# Patient Record
Sex: Male | Born: 1955
Health system: Southern US, Community
[De-identification: ages and names within clinical notes are randomized; demographics above are authoritative.]

## PROBLEM LIST (undated history)

## (undated) DIAGNOSIS — I10 Essential (primary) hypertension: Secondary | ICD-10-CM

## (undated) DIAGNOSIS — I4892 Unspecified atrial flutter: Secondary | ICD-10-CM

## (undated) DIAGNOSIS — E785 Hyperlipidemia, unspecified: Secondary | ICD-10-CM

## (undated) DIAGNOSIS — M109 Gout, unspecified: Secondary | ICD-10-CM

## (undated) DIAGNOSIS — M1990 Unspecified osteoarthritis, unspecified site: Secondary | ICD-10-CM

## (undated) DIAGNOSIS — I4819 Other persistent atrial fibrillation: Secondary | ICD-10-CM

## (undated) DIAGNOSIS — I499 Cardiac arrhythmia, unspecified: Secondary | ICD-10-CM

## (undated) DIAGNOSIS — R7989 Other specified abnormal findings of blood chemistry: Secondary | ICD-10-CM

## (undated) DIAGNOSIS — G2581 Restless legs syndrome: Secondary | ICD-10-CM

## (undated) DIAGNOSIS — I4891 Unspecified atrial fibrillation: Secondary | ICD-10-CM

## (undated) DIAGNOSIS — G4730 Sleep apnea, unspecified: Secondary | ICD-10-CM

## (undated) HISTORY — DX: Other persistent atrial fibrillation: I48.19

## (undated) HISTORY — DX: Restless legs syndrome: G25.81

## (undated) HISTORY — PX: ELBOW SURGERY: SHX618

## (undated) HISTORY — PX: HERNIA REPAIR: SHX51

## (undated) HISTORY — PX: FOOT SURGERY: SHX648

## (undated) HISTORY — DX: Hyperlipidemia, unspecified: E78.5

## (undated) HISTORY — PX: OTHER SURGICAL HISTORY: SHX169

## (undated) HISTORY — DX: Other specified abnormal findings of blood chemistry: R79.89

## (undated) HISTORY — DX: Sleep apnea, unspecified: G47.30

## (undated) HISTORY — DX: Essential (primary) hypertension: I10

## (undated) HISTORY — PX: KNEE ARTHROSCOPY W/ DEBRIDEMENT: SHX1867

## (undated) HISTORY — PX: COLONOSCOPY: SHX174

## (undated) HISTORY — DX: Unspecified osteoarthritis, unspecified site: M19.90

---

## 2010-12-29 HISTORY — PX: LUMBAR DISC SURGERY: SHX700

## 2013-12-29 HISTORY — PX: ATRIAL FIBRILLATION ABLATION: EP1191

## 2015-04-16 LAB — LIPID PANEL
Cholesterol: 221 mg/dL — AB (ref 0–200)
HDL: 51 mg/dL (ref 35–70)
LDL CALC: 141 mg/dL
Triglycerides: 144 mg/dL (ref 40–160)

## 2015-04-16 LAB — CBC AND DIFFERENTIAL
HEMATOCRIT: 46 % (ref 41–53)
HEMOGLOBIN: 15.5 g/dL (ref 13.5–17.5)
NEUTROS ABS: 65 /uL
Platelets: 259 10*3/uL (ref 150–399)
WBC: 5.5 10^3/mL

## 2015-04-16 LAB — PSA: PSA: 1

## 2015-09-10 DIAGNOSIS — I4892 Unspecified atrial flutter: Secondary | ICD-10-CM | POA: Insufficient documentation

## 2015-09-10 DIAGNOSIS — I484 Atypical atrial flutter: Secondary | ICD-10-CM | POA: Insufficient documentation

## 2015-09-10 DIAGNOSIS — Y9389 Activity, other specified: Secondary | ICD-10-CM | POA: Insufficient documentation

## 2016-04-29 DIAGNOSIS — Z8679 Personal history of other diseases of the circulatory system: Secondary | ICD-10-CM | POA: Insufficient documentation

## 2017-04-15 ENCOUNTER — Ambulatory Visit (INDEPENDENT_AMBULATORY_CARE_PROVIDER_SITE_OTHER): Payer: BLUE CROSS/BLUE SHIELD | Admitting: Family Medicine

## 2017-04-15 ENCOUNTER — Encounter: Payer: Self-pay | Admitting: Family Medicine

## 2017-04-15 VITALS — BP 130/83 | HR 70 | Temp 98.2°F | Resp 20 | Ht 72.25 in | Wt 222.5 lb

## 2017-04-15 DIAGNOSIS — F419 Anxiety disorder, unspecified: Secondary | ICD-10-CM | POA: Diagnosis not present

## 2017-04-15 DIAGNOSIS — M79644 Pain in right finger(s): Secondary | ICD-10-CM

## 2017-04-15 DIAGNOSIS — I482 Chronic atrial fibrillation: Secondary | ICD-10-CM

## 2017-04-15 DIAGNOSIS — G8929 Other chronic pain: Secondary | ICD-10-CM | POA: Insufficient documentation

## 2017-04-15 DIAGNOSIS — Z0000 Encounter for general adult medical examination without abnormal findings: Secondary | ICD-10-CM | POA: Diagnosis not present

## 2017-04-15 DIAGNOSIS — Z79899 Other long term (current) drug therapy: Secondary | ICD-10-CM | POA: Insufficient documentation

## 2017-04-15 DIAGNOSIS — E785 Hyperlipidemia, unspecified: Secondary | ICD-10-CM | POA: Insufficient documentation

## 2017-04-15 DIAGNOSIS — I4821 Permanent atrial fibrillation: Secondary | ICD-10-CM | POA: Insufficient documentation

## 2017-04-15 LAB — BASIC METABOLIC PANEL WITH GFR
BUN: 15 mg/dL (ref 7–25)
CHLORIDE: 102 mmol/L (ref 98–110)
CO2: 25 mmol/L (ref 20–31)
Calcium: 9.8 mg/dL (ref 8.6–10.3)
Creat: 0.92 mg/dL (ref 0.70–1.25)
GFR, EST NON AFRICAN AMERICAN: 89 mL/min (ref >=60)
GLUCOSE: 95 mg/dL (ref 65–99)
Potassium: 4.7 mmol/L (ref 3.5–5.3)
Sodium: 138 mmol/L (ref 135–146)

## 2017-04-15 MED ORDER — ESCITALOPRAM OXALATE 10 MG PO TABS: 10.0000 mg | ORAL_TABLET | Freq: Every day | ORAL | 0 refills | Status: DC
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
## 2017-04-15 NOTE — Patient Instructions (Signed)
Nice to meet you today! We will refer to cardiology for A. Fib and sports med for your thumb.   Once we get your records from prior doctors/primary we would want you to schedule your physical. You could go ahead and schedule a physical to occur within 2 months, we should have whatever records we are going to get by that time.    I refilled the lexapro, you can try 1/2 tab for 1 week, then 1/2 tab every  Other day for 1 week, then stop.   Please help [place] help you:  We are honored you have chosen [facility] for your Primary Care home. Below you will find basic instructions that you may need to access in the future. Please help [place] help you by reading the instructions, which cover many of the frequent questions we experience.   Prescription refills and request:  -In order to allow more efficient response time, please call your pharmacy for all refills. They will forward the request electronically to [place]. This allows for the quickest possible response. Request left on a nurse line can take longer to refill, since these are checked as time allows between office patients and other phone calls.  - refill request can take up to 3-5 working days to complete.  - If request is sent electronically and request is appropiate, it is usually completed in 1-2 business days.  - all patients will need to be seen routinely for all chronic medical conditions requiring prescription medications (see follow-up below). If you are overdue for follow up on your condition, you will be asked to make an appointment and we will call in enough medication to cover you until your appointment (up to 30 days).  - all controlled substances will require a face to face visit to request/refill.  - if you desire your prescriptions to go through a new pharmacy, and have an active script at original pharmacy, you will need to call your pharmacy and have scripts transferred to new pharmacy. This is completed between the pharmacy locations  and not by your provider.    Results: If any images or labs were ordered, it can take up to 1 week to get results depending on the test ordered and the lab/facility running and resulting the test. - Normal or stable results, which do not need further discussion, will be released to your mychart immediately with attached note to you. A call will not be generated for normal results. Please make certain to sign up for mychart. If you have questions on how to activate your mychart you can call the front office.  - If your results need further discussion, our office will attempt to contact you via phone, and if unable to reach you after 2 attempts, we will release your abnormal result to your mychart with instructions.  - All results will be automatically released in mychart after 1 week.  - Your provider will provide you with explanation and instruction on all relevant material in your results. Please keep in mind, results and labs may appear confusing or abnormal to the untrained eye, but it does not mean they are actually abnormal for you personally. If you have any questions about your results that are not covered, or you desire more detailed explanation than what was provided, you should make an appointment with your provider to do so.   Our office handles many outgoing and incoming calls daily. If we have not contacted you within 1 week about your results, please  check your mychart to see if there is a message first and if not, then contact our office.  In helping with this matter, you help decrease call volume, and therefore allow [place] to be able to respond to patients needs more efficiently.   Acute office visits (sick visit):  An acute visit is intended for a new problem and are scheduled in shorter time slots to allow schedule openings for patients with new problems. This is the appropriate visit to discuss a new problem. In order to provide you with excellent quality medical care with proper time for  you to explain your problem, have an exam and receive treatment with instructions, these appointments should be limited to one new problem per visit. If you experience a new problem, in which you desire to be addressed, please make an acute office visit, we save openings on the schedule to accommodate you. Please do not save your new problem for any other type of visit, let us take care of it properly and quickly for you.   Follow up visits:  Depending on your condition(s) your provider will need to see you routinely in order to provide you with quality care and prescribe medication(s). Most chronic conditions (Example: hypertension, Diabetes, depression/anxiety... etc), require visits a couple times a year. Your provider will instruct you on proper follow up for your personal medical conditions and history. Please make certain to make follow up appointments for your condition as instructed. Failing to do so could result in lapse in your medication treatment/refills. If you request a refill, and are overdue to be seen on a condition, we will always provide you with a 30 day script (once) to allow you time to schedule.    Medicare wellness (well visit): - we have a wonderful Nurse Maudie Mercury), that will meet with you and provide you will yearly medicare wellness visits. These visits should occur yearly (can not be scheduled less than 1 calendar year apart) and cover preventive health, immunizations, advance directives and screenings you are entitled to yearly through your medicare benefits. Do not miss out on your entitled benefits, this is when medicare will pay for these benefits to be ordered for you.  These are strongly encouraged by your provider and is the appropriate type of visit to make certain you are up to date with all preventive health benefits. If you have not had your medicare wellness exam in the last 12 months, please make certain to schedule one by calling the office and schedule your medicare  wellness with Maudie Mercury as soon as possible.   Yearly physical (well visit):  - Adults are recommended to be seen yearly for physicals. Check with your insurance and date of your last physical, most insurances require one calendar year between physicals. Physicals include all preventive health topics, screenings, medical exam and labs that are appropriate for gender/age and history. You may have fasting labs needed at this visit. This is a well visit (not a sick visit), acute topics should not be covered during this visit.  - Pediatric patients are seen more frequently when they are younger. Your provider will advise you on well child visit timing that is appropriate for your their age. - This is not a medicare wellness visit. Medicare wellness exams do not have an exam portion to the visit. Some medicare companies allow for a physical, some do not allow a yearly physical. If your medicare allows a yearly physical you can schedule the medicare wellness with our nurse Maudie Mercury and  have your physical with your provider after, on the same day. Please check with insurance for your full benefits.   Late Policy/No Shows:  - all new patients should arrive 15-30 minutes earlier than appointment to allow [place] time  to  obtain all personal demographics,  insurance information and for you to complete office paperwork. - All established patients should arrive 10-15 minutes earlier than appointment time to update all information and be checked in .  - In our best efforts to run on time, if you are late for your appointment you will be asked to either reschedule or if able, we will work you back into the schedule. There will be a wait time to work you back in the schedule,  depending on availability.  - If you are unable to make it to your appointment as scheduled, please call 24 hours ahead of time to allow [place] to fill the time slot with someone else who needs to be seen. If you do not cancel your appointment ahead of time, you may  be charged a no show fee.

## 2017-04-16 ENCOUNTER — Telehealth: Payer: Self-pay | Admitting: Family Medicine

## 2017-04-16 NOTE — Telephone Encounter (Signed)
Spoke to patient reviewed lab results and instructions. Patient will follow up with getting his medical records to [place]. He will call back to schedule a CPE.

## 2017-04-16 NOTE — Telephone Encounter (Signed)
Please call pt: - his labs from yesterday are normal. He can sign up for mychart (he is a new pt) to review the specifics if he desires.  - I have placed the referral to cardiology and Sports med as we discussed.  - recommend he schedule a physical sometime in May, that should allow [place] time to receive any records that we will be able to get from prior providers.  - will definitely need his GI recs,

## 2017-04-17 ENCOUNTER — Encounter: Payer: Self-pay | Admitting: Family Medicine

## 2017-04-17 ENCOUNTER — Encounter: Payer: BLUE CROSS/BLUE SHIELD | Admitting: Sports Medicine

## 2017-04-17 ENCOUNTER — Encounter: Payer: Self-pay | Admitting: *Deleted

## 2017-04-22 ENCOUNTER — Ambulatory Visit (INDEPENDENT_AMBULATORY_CARE_PROVIDER_SITE_OTHER): Payer: BLUE CROSS/BLUE SHIELD | Admitting: Cardiovascular Disease

## 2017-04-22 ENCOUNTER — Encounter: Payer: Self-pay | Admitting: Cardiovascular Disease

## 2017-04-22 VITALS — BP 141/81 | HR 60 | Ht 72.0 in | Wt 227.0 lb

## 2017-04-22 DIAGNOSIS — I482 Chronic atrial fibrillation: Secondary | ICD-10-CM

## 2017-04-22 DIAGNOSIS — E7800 Pure hypercholesterolemia, unspecified: Secondary | ICD-10-CM

## 2017-04-22 DIAGNOSIS — I4821 Permanent atrial fibrillation: Secondary | ICD-10-CM

## 2017-04-22 NOTE — Assessment & Plan Note (Signed)
Mr. Lofstrom had a fib ablation by Dr. Tally Due [facility] 5-7 years ago. He had 4-5 DC cardioversion since the last one was a year ago at [facility]. He is on Tikosyn and currently is not on oral anticoagulation.

## 2017-04-22 NOTE — Assessment & Plan Note (Signed)
History of hyperlipidemia on statin therapy followed by his PCP 

## 2017-04-22 NOTE — Patient Instructions (Signed)
Medication Instructions: Your physician recommends that you continue on your current medications as directed. Please refer to the Current Medication list given to you today.   Follow-Up: Your physician wants you to follow-up in: 1 year with Dr. Gwenlyn Found. You will receive a reminder letter in the mail two months in advance. If you don't receive a letter, please call our office to schedule the follow-up appointment.

## 2017-04-22 NOTE — Progress Notes (Signed)
04/22/2017 Johnathan [name]   1956/10/09  [id number]  Primary Physician Howard Pouch, DO Primary Cardiologist: Lorretta Harp MD Renae Gloss  HPI:  Mr. Johnathan [name] is a delightful 61 year old mildly overweight married Caucasian male father of 2 children, grandfather of 1 grandchild accompanied by his wife Johnathan [name]. 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 He was referred by Dr. Kerry Hough D.O. establish her cardiovascular practice because his prior A. fib history. His cardiovascular risk factor profile is notable for treated hyperlipidemia and family history. Brother died of myocardial infarction at age 105. He's never had a heart attack or stroke. He denies chest pain or shortness of breath. He does have obstructive sleep apnea on CPAP which she wears intermittently. He had A. fib ablation at [facility] 5-7 years ago by Dr. Tally Due. He currently is on Tikosyn. He is not on oral anticoagulation. He said 4-5 DC cardioversions in the past since his ablation, the last one being one year ago at [facility] by Dr. Kelli Churn. .   Current Outpatient Prescriptions  Medication Sig Dispense Refill  . atorvastatin (LIPITOR) 40 MG tablet Take 40 mg by mouth daily.     [name] dofetilide (TIKOSYN) 500 MCG capsule Take 500 mcg by mouth 2 (two) times daily.     [name] escitalopram (LEXAPRO) 10 MG tablet Take 1 tablet (10 mg total) by mouth daily. 30 tablet 0   No current facility-administered medications for this visit.     Allergies  Allergen Reactions  . Sulfa Antibiotics     Unknown reaction in childhood    Social History   Social History  . Marital status: Married    Spouse name: [name]  . Number of children: 2  . Years of education: 16   Occupational History  . Not on file.   Social History Main Topics  . Smoking status: Never Smoker  . Smokeless tobacco: Former Systems developer    Types: Chew  . Alcohol use 6.0 oz/week    10 Cans of beer  per week  . Drug use: No  . Sexual activity: Yes    Partners: Female     Comment: Married   Other Topics Concern  . Not on file   Social History Narrative   Married to [name]. 2 children [name].    BS degree.    Wear seatbelt. Wears a bicycle helmet. Smoke detector in the home.   Firearms in the home.   Feels safe in  relationships.     Review of Systems: General: negative for chills, fever, night sweats or weight changes.  Cardiovascular: negative for chest pain, dyspnea on exertion, edema, orthopnea, palpitations, paroxysmal nocturnal dyspnea or shortness of breath Dermatological: negative for rash Respiratory: negative for cough or wheezing Urologic: negative for hematuria Abdominal: negative for nausea, vomiting, diarrhea, bright red blood per rectum, melena, or hematemesis Neurologic: negative for visual changes, syncope, or dizziness All other systems reviewed and are otherwise negative except as noted above.    Blood pressure (!) 141/81, pulse 60, height 6' (1.829 m), weight 227 lb (103 kg).  General appearance: alert and no distress Neck: no adenopathy, no carotid bruit, no JVD, supple, symmetrical, trachea midline and thyroid not enlarged, symmetric, no tenderness/mass/nodules Lungs: clear to auscultation bilaterally Heart: regular rate and rhythm, S1, S2 normal, no murmur, click, rub or gallop Extremities: extremities normal, atraumatic, no cyanosis or edema  EKG sinus rhythm at 60  with ST or T wave changes. The QTC was 416 ms. I personally reviewed this EKG  ASSESSMENT AND PLAN:   Permanent atrial fibrillation [facility]) Mr. Rivkin had a fib ablation by Dr. Tally Due [facility] 5-7 years ago. He had 4-5 DC cardioversion since the last one was a year ago at [facility]. He is on Tikosyn and currently is not on oral anticoagulation.  Hyperlipidemia History of hyperlipidemia on statin therapy followed by his PCP      Lorretta Harp MD [facility],  [facility] 04/22/2017 4:39 PM

## 2017-04-30 ENCOUNTER — Encounter: Payer: BLUE CROSS/BLUE SHIELD | Admitting: Sports Medicine

## 2017-05-07 ENCOUNTER — Ambulatory Visit (INDEPENDENT_AMBULATORY_CARE_PROVIDER_SITE_OTHER): Payer: BLUE CROSS/BLUE SHIELD

## 2017-05-07 ENCOUNTER — Encounter: Payer: Self-pay | Admitting: Sports Medicine

## 2017-05-07 ENCOUNTER — Ambulatory Visit (INDEPENDENT_AMBULATORY_CARE_PROVIDER_SITE_OTHER): Payer: BLUE CROSS/BLUE SHIELD | Admitting: Sports Medicine

## 2017-05-07 DIAGNOSIS — M868X4 Other osteomyelitis, hand: Secondary | ICD-10-CM

## 2017-05-07 DIAGNOSIS — M1811 Unilateral primary osteoarthritis of first carpometacarpal joint, right hand: Secondary | ICD-10-CM

## 2017-05-07 DIAGNOSIS — M19041 Primary osteoarthritis, right hand: Secondary | ICD-10-CM | POA: Diagnosis not present

## 2017-05-07 NOTE — Assessment & Plan Note (Addendum)
Has failed conservative measures and topical anti-inflammatories and oral NSAIDs.  He had a steroid injection over a year ago that provided good response, this will be repeated today. X-rays.  Return to see me in one month.

## 2017-05-07 NOTE — Progress Notes (Signed)
   Subjective:    I'm seeing this patient as a consultation for:   Dr. Howard Pouch  CC: Right hand pain  HPI: This is a pleasant 61 year old male, for years he's had pain that he localizes at the base of the right thumb, moderate, persistent without radiation, had an injection over a year ago that seemed to work fairly well. Since then NSAIDs, topical anti-inflammatories, activity modification has not been effective. He desires interventional treatment today.  Past medical history:  Negative.  See flowsheet/record as well for more information.  Surgical history: Negative.  See flowsheet/record as well for more information.  Family history: Negative.  See flowsheet/record as well for more information.  Social history: Negative.  See flowsheet/record as well for more information.  Allergies, and medications have been entered into the medical record, reviewed, and no changes needed.   Review of Systems: No headache, visual changes, nausea, vomiting, diarrhea, constipation, dizziness, abdominal pain, skin rash, fevers, chills, night sweats, weight loss, swollen lymph nodes, body aches, joint swelling, muscle aches, chest pain, shortness of breath, mood changes, visual or auditory hallucinations.   Objective:   General: Well Developed, well nourished, and in no acute distress.  Neuro/Psych: Alert and oriented x3, extra-ocular muscles intact, able to move all 4 extremities, sensation grossly intact. Skin: Warm and dry, no rashes noted.  Respiratory: Not using accessory muscles, speaking in full sentences, trachea midline.  Cardiovascular: Pulses palpable, no extremity edema. Abdomen: Does not appear distended. Right Wrist: Inspection normal with no visible erythema or swelling. ROM smooth and normal with good flexion and extension and ulnar/radial deviation that is symmetrical with opposite wrist. Palpation is normal over metacarpals, navicular, lunate, and TFCC; tendons without tenderness/  swelling No snuffbox tenderness. No tenderness over Canal of [place]. Strength 5/5 in all directions without pain. Negative tinel's and phalens signs. Negative Finkelstein sign. Negative Watson's test. Tender to palpation at the thumb basal joint  Procedure: Real-time Ultrasound Guided Injection of right trapeziometacarpal joint Device: GE Logiq E  Verbal informed consent obtained.  Time-out conducted.  Noted no overlying erythema, induration, or other signs of local infection.  Skin prepped in a sterile fashion.  Local anesthesia: Topical Ethyl chloride.  With sterile technique and under real time ultrasound guidance:  I guided the 25-gauge needle into the joint, and injected 1/2 mL kenalog 40, 1/2 mL lidocaine. Completed without difficulty  Pain immediately resolved suggesting accurate placement of the medication.  Advised to call if fevers/chills, erythema, induration, drainage, or persistent bleeding.  Images permanently stored and available for review in the ultrasound unit.  Impression: Technically successful ultrasound guided injection.  Impression and Recommendations:   This case required medical decision making of moderate complexity.  Primary osteoarthritis of first carpometacarpal joint of right hand Has failed conservative measures and topical anti-inflammatories and oral NSAIDs.  He had a steroid injection over a year ago that provided good response, this will be repeated today. X-rays.  Return to see me in one month.

## 2017-05-26 ENCOUNTER — Other Ambulatory Visit: Payer: Self-pay

## 2017-05-26 MED ORDER — ATORVASTATIN CALCIUM 40 MG PO TABS: 40.0000 mg | ORAL_TABLET | Freq: Every day | ORAL | 1 refills | Status: DC

## 2017-05-26 NOTE — Telephone Encounter (Signed)
Refill sent on Atorvastatin.

## 2017-06-05 ENCOUNTER — Ambulatory Visit: Payer: BLUE CROSS/BLUE SHIELD | Admitting: Sports Medicine

## 2017-06-05 DIAGNOSIS — Z0189 Encounter for other specified special examinations: Secondary | ICD-10-CM

## 2017-07-28 ENCOUNTER — Other Ambulatory Visit: Payer: Self-pay

## 2017-07-28 MED ORDER — ATORVASTATIN CALCIUM 40 MG PO TABS: 40.0000 mg | ORAL_TABLET | Freq: Every day | ORAL | 0 refills | Status: DC

## 2017-07-28 NOTE — Telephone Encounter (Signed)
Refill sent.

## 2017-08-04 ENCOUNTER — Telehealth: Payer: Self-pay | Admitting: Family Medicine

## 2017-08-04 NOTE — Telephone Encounter (Signed)
Patient requesting refill on Lexapro. Appointment scheduled for Friday, Aug. 10. Last filled 04/15/17, #30 NRF.

## 2017-08-04 NOTE — Telephone Encounter (Signed)
Patient notified and verbalized understanding. 

## 2017-08-04 NOTE — Telephone Encounter (Signed)
Patient is checking on Rx. Please call him back.

## 2017-08-04 NOTE — Telephone Encounter (Signed)
I received this request today for refill on lexapro 10 mg, that was written in April for a month only script. I am uncertain of the need to refill urgently if he has not been taking for months and has an appt for this issue in 3 days. He was tapering off this medication at that time.  As long as not an urgent need, will wait until his appt in 3 days to fill. If urgent he needs  to be seen sooner.

## 2017-08-07 ENCOUNTER — Encounter: Payer: Self-pay | Admitting: Family Medicine

## 2017-08-07 ENCOUNTER — Ambulatory Visit (INDEPENDENT_AMBULATORY_CARE_PROVIDER_SITE_OTHER): Payer: BLUE CROSS/BLUE SHIELD | Admitting: Family Medicine

## 2017-08-07 DIAGNOSIS — F419 Anxiety disorder, unspecified: Secondary | ICD-10-CM | POA: Diagnosis not present

## 2017-08-07 MED ORDER — ESCITALOPRAM OXALATE 5 MG PO TABS: 5.0000 mg | ORAL_TABLET | Freq: Every day | ORAL | 0 refills | Status: DC

## 2017-08-07 NOTE — Progress Notes (Signed)
Patient ID: Johnathan [name], male  DOB: May 02, 1956, 61 y.o.   MRN: [id number] Patient Care Team    Relationship Specialty Notifications Start End  Ma Hillock, DO PCP - General Family Medicine  04/15/17    Chief Complaint  Patient presents with  . Anxiety     Subjective:  Johnathan [name] is a 61 y.o.  male present for anxiety Anxiety:  Pt presents for follow up on his anxiety. When he established in April he desired to stop medication  (see note below). He then requested refill last week on medicine. He reports he has really been taking 1/2 tab daily. He does not feel it is doing much for him, but he also feels he gets more headaches when he doe snot use it. He still desires to come off medication all together.  Prior note 03/2017:  Patient reports he was started on Lexapro 10 mg many years ago when he was living in [place]. He states he was overwhelmed, working a lot of hours and was having difficulty adjusting. He reports he has never been successful stopping medication. He states he never has tapered off of the medication, but he has stopped the medication abruptly on a few occasions and needed to restart secondary to having side effects of dizziness.   Depression screen [facility] 2/9 08/07/2017 04/15/2017  Decreased Interest 0 0  Down, Depressed, Hopeless 0 0  PHQ - 2 Score 0 0  Altered sleeping 0 -  Tired, decreased energy 0 -  Change in appetite 0 -  Feeling bad or failure about yourself  0 -  Trouble concentrating 0 -  Moving slowly or fidgety/restless 0 -  Suicidal thoughts 0 -  PHQ-9 Score 0 -     There is no immunization history on file for this patient.   Past Medical History:  Diagnosis Date  . A-fib ([place])   . Heart murmur   . Hyperlipidemia   . Hypertension   . RLS (restless legs syndrome)   . Sleep apnea    CPAP   Allergies  Allergen Reactions  . Sulfa Antibiotics     Unknown reaction in childhood   Past Surgical History:  Procedure Laterality Date  .  ATRIAL FIBRILLATION ABLATION  2015  . FOOT SURGERY    . HERNIA REPAIR    . LUMBAR [place] SURGERY  2012   L3-L4, No hardware   Family History  Problem Relation Age of Onset  . Diabetes Mother   . Diabetes Brother   . Early death Brother   . Heart disease Brother    Social History   Social History  . Marital status: Married    Spouse name: Johnathan [name]  . Number of children: 2  . Years of education: 16   Occupational History  . Not on file.   Social History Main Topics  . Smoking status: Never Smoker  . Smokeless tobacco: Former Systems developer    Types: Chew  . Alcohol use 6.0 oz/week    10 Cans of beer per week  . Drug use: No  . Sexual activity: Yes    Partners: Female     Comment: Married   Other Topics Concern  . Not on file   Social History Narrative   Married to [place]. 2 children [place].    BS degree.    Wear seatbelt. Wears a bicycle helmet. Smoke detector in the home.   Firearms in the home.   Feels safe in  relationships.  Allergies as of 08/07/2017      Reactions   Sulfa Antibiotics    Unknown reaction in childhood      Medication List       Accurate as of 08/07/17  2:50 PM. Always use your most recent med list.          atorvastatin 40 MG tablet Commonly known as:  LIPITOR Take 1 tablet (40 mg total) by mouth daily. Patient needs follow up/CPE for further refills.   dofetilide 500 MCG capsule Commonly known as:  TIKOSYN Take 500 mcg by mouth 2 (two) times daily.   escitalopram 5 MG tablet Commonly known as:  LEXAPRO Take 1 tablet (5 mg total) by mouth daily.        No results found for this or any previous visit (from the past 2160 hour(s)).  Patient was never admitted.   ROS: 14 pt review of systems performed and negative (unless mentioned in an HPI)  Objective: BP 134/80 (BP Location: Left Arm, Patient Position: Sitting, Cuff Size: Large)   Pulse 68   Temp 98.4 F (36.9 C)   Resp 20   Ht 6' (1.829 m)   Wt 232 lb 4 oz  (105.3 kg)   SpO2 97%   BMI 31.50 kg/m  Gen: Afebrile. No acute distress.  Psych: Normal affect, dress and demeanor. Normal speech. Normal thought content and judgment..    Assessment/plan: Johnathan [name] is a 61 y.o. male present for  Anxiety - Patient desires tapering off medication again.Since he has been take 5 mg (by halving the 10), will refill lexapro 5 mg for him to taper over next 3-4 weeks. Instructions on taper provided in AVS. - If he decides after trying to taper off, he was feeling better on the medicine, ok to prescribe lexapro 5 mg QD for 6 months then follow (as long as call back is within a few weeks after stop of med).  - F/u PRN   He was encouraged to schedule his CPE so we can get all his preventive screenings completed. He is unable to locate records on any prior colonoscopy.    Return in about 6 months (around 02/07/2018), or if symptoms worsen or fail to improve.  Electronically signed by: Howard Pouch, DO [place]

## 2017-08-07 NOTE — Patient Instructions (Signed)
1 pill for 1 week, then 1/2 pill for 2 weeks, then 1/2 pill every other day for 3 doses. Then stop.    I hope this works, if it does not or you decide you want to stay on med, we can always refill the 5 mg dose for you.

## 2017-09-01 ENCOUNTER — Other Ambulatory Visit: Payer: Self-pay

## 2017-09-01 MED ORDER — ATORVASTATIN CALCIUM 40 MG PO TABS
40.0000 mg | ORAL_TABLET | Freq: Every day | ORAL | 0 refills | Status: DC
Start: 2017-09-01 — End: 2018-03-22

## 2017-09-01 NOTE — Telephone Encounter (Signed)
Refill sent.

## 2017-09-04 ENCOUNTER — Other Ambulatory Visit: Payer: Self-pay | Admitting: *Deleted

## 2017-09-04 DIAGNOSIS — F419 Anxiety disorder, unspecified: Secondary | ICD-10-CM

## 2017-09-04 MED ORDER — ESCITALOPRAM OXALATE 5 MG PO TABS: 5.0000 mg | ORAL_TABLET | Freq: Every day | ORAL | 5 refills | Status: DC

## 2017-09-04 NOTE — Telephone Encounter (Signed)
Refill request for Lexapro 5 mg received. Spoke with patient he is continuing to take the 5 mg dose daily. Refills sent to pharmacy as directed

## 2017-10-02 ENCOUNTER — Telehealth: Payer: Self-pay | Admitting: Cardiovascular Disease

## 2017-10-02 MED ORDER — DOFETILIDE 500 MCG PO CAPS: 500.0000 ug | ORAL_CAPSULE | Freq: Two times a day (BID) | ORAL | 12 refills | Status: DC

## 2017-10-02 NOTE — Telephone Encounter (Signed)
°*  STAT* If patient is at the pharmacy, call can be transferred to refill team.   1. Which medications need to be refilled? (please list name of each medication and dose if known)  dosetilide 0.31mh 2x day 2. Which pharmacy/location (including street and city if local pharmacy) is medication to be sent to? [facility] [place]  3. Do they need a 30 day or 90 day supply? 63  Pt said he went to [facility] for for his refills  Pt want to be notified that he is going to get the mediation or not by today

## 2017-10-02 NOTE — Telephone Encounter (Signed)
Refill sent to the pharmacy electronically.  

## 2017-10-16 ENCOUNTER — Telehealth: Payer: Self-pay | Admitting: Cardiovascular Disease

## 2017-10-16 NOTE — Telephone Encounter (Signed)
Returned call to patient of Dr. Gwenlyn Found. He needs to get a tooth pulled. They plan to use the "gas" and novocain. He would like MD OK for this and to ensure there would be not issues w/this and his AFib. No "formal" cardiac clearance is needed  Procedure is tentative for Friday 10/26   Routed to MD to review

## 2017-10-16 NOTE — Telephone Encounter (Signed)
New message    Pt is calling asking for a call back. He has a question about afib.

## 2017-10-16 NOTE — Telephone Encounter (Signed)
No problems regarding his dental procedure and cardiac issues.

## 2017-10-22 NOTE — Telephone Encounter (Signed)
Informed pt of Dr. Kennon Holter recommendation. Pt verbalized thanks.

## 2017-11-15 ENCOUNTER — Emergency Department (HOSPITAL_COMMUNITY)
Admission: EM | Admit: 2017-11-15 | Discharge: 2017-11-15 | Disposition: A | Discharge status: Home / Self Care | Payer: BLUE CROSS/BLUE SHIELD | Attending: Emergency Medicine | Admitting: Emergency Medicine

## 2017-11-15 ENCOUNTER — Other Ambulatory Visit: Payer: Self-pay

## 2017-11-15 ENCOUNTER — Encounter (HOSPITAL_COMMUNITY): Payer: Self-pay

## 2017-11-15 DIAGNOSIS — I10 Essential (primary) hypertension: Secondary | ICD-10-CM | POA: Insufficient documentation

## 2017-11-15 DIAGNOSIS — Z79899 Other long term (current) drug therapy: Secondary | ICD-10-CM | POA: Insufficient documentation

## 2017-11-15 DIAGNOSIS — I4891 Unspecified atrial fibrillation: Secondary | ICD-10-CM | POA: Diagnosis not present

## 2017-11-15 DIAGNOSIS — R000 Tachycardia, unspecified: Secondary | ICD-10-CM | POA: Diagnosis not present

## 2017-11-15 DIAGNOSIS — R002 Palpitations: Secondary | ICD-10-CM | POA: Diagnosis not present

## 2017-11-15 LAB — BASIC METABOLIC PANEL
Anion gap: 9 (ref 5–15)
BUN: 16 mg/dL (ref 6–20)
CHLORIDE: 103 mmol/L (ref 101–111)
CO2: 24 mmol/L (ref 22–32)
CREATININE: 1 mg/dL (ref 0.61–1.24)
Calcium: 9.4 mg/dL (ref 8.9–10.3)
GFR calc Af Amer: >60 mL/min (ref >60)
GFR calc non Af Amer: >60 mL/min (ref >60)
Glucose, Bld: 117 mg/dL — ABNORMAL HIGH (ref 65–99)
POTASSIUM: 3.9 mmol/L (ref 3.5–5.1)
Sodium: 136 mmol/L (ref 135–145)

## 2017-11-15 LAB — CBC
HEMATOCRIT: 47.5 % (ref 39.0–52.0)
Hemoglobin: 16.8 g/dL (ref 13.0–17.0)
MCH: 31.5 pg (ref 26.0–34.0)
MCHC: 35.4 g/dL (ref 30.0–36.0)
MCV: 89.1 fL (ref 78.0–100.0)
PLATELETS: 254 10*3/uL (ref 150–400)
RBC: 5.33 MIL/uL (ref 4.22–5.81)
RDW: 13.1 % (ref 11.5–15.5)
WBC: 6.9 10*3/uL (ref 4.0–10.5)

## 2017-11-15 NOTE — ED Provider Notes (Signed)
[facility] EMERGENCY DEPARTMENT Provider Note   CSN: [id number] Arrival date & time: 11/15/17  1158     History   Chief Complaint Chief Complaint  Patient presents with  . Palpitations    afib    HPI Johnathan [name] is a 61 y.o. male.  Patient with hx afib, c/o palpitations onset this AM.  States occurred at rest. Denies syncope. No chest pain or discomfort. No sob. States recent health at baseline. Indicates compliant w meds, denies recent change in meds or doses. Some recent increased in stress, but states nothing severe/terrible. Did have several etoh beverages last night. Denies other recent episodes of afib.    The history is provided by the patient.  Palpitations   Pertinent negatives include no fever, no chest pain, no abdominal pain, no headaches, no back pain and no shortness of breath.    Past Medical History:  Diagnosis Date  . A-fib ([place])   . Heart murmur   . Hyperlipidemia   . Hypertension   . RLS (restless legs syndrome)   . Sleep apnea    CPAP    Patient Active Problem List   Diagnosis Date Noted  . Primary osteoarthritis of first carpometacarpal joint of right hand 05/07/2017  . Permanent atrial fibrillation ([place]) 04/15/2017  . Chronic pain of right thumb 04/15/2017  . Anxiety 04/15/2017  . Long-term use of high-risk medication 04/15/2017  . Hyperlipidemia     Past Surgical History:  Procedure Laterality Date  . ATRIAL FIBRILLATION ABLATION  2015  . FOOT SURGERY    . HERNIA REPAIR    . LUMBAR [place] SURGERY  2012   L3-L4, No hardware       Home Medications    Prior to Admission medications   Medication Sig Start Date End Date Taking? Authorizing Provider  atorvastatin (LIPITOR) 40 MG tablet Take 1 tablet (40 mg total) by mouth daily. Patient needs follow up/CPE for further refills. 09/01/17   Kuneff, Renee A, DO  dofetilide (TIKOSYN) 500 MCG capsule Take 1 capsule (500 mcg total) by mouth 2 (two) times daily. 10/02/17    Lorretta Harp, MD  escitalopram (LEXAPRO) 5 MG tablet Take 1 tablet (5 mg total) by mouth daily. 09/04/17   McGowen, Adrian Blackwater, MD    Family History Family History  Problem Relation Age of Onset  . Diabetes Mother   . Diabetes Brother   . Early death Brother   . Heart disease Brother     Social History Social History   Tobacco Use  . Smoking status: Never Smoker  . Smokeless tobacco: Former Systems developer    Types: Chew  Substance Use Topics  . Alcohol use: Yes    Alcohol/week: 6.0 oz    Types: 10 Cans of beer per week  . Drug use: No     Allergies   Sulfa antibiotics   Review of Systems Review of Systems  Constitutional: Negative for fever.  HENT: Negative for sore throat.   Eyes: Negative for redness.  Respiratory: Negative for shortness of breath.   Cardiovascular: Positive for palpitations. Negative for chest pain.  Gastrointestinal: Negative for abdominal pain.  Genitourinary: Negative for flank pain.  Musculoskeletal: Negative for back pain.  Skin: Negative for rash.  Neurological: Negative for headaches.  Hematological: Does not bruise/bleed easily.  Psychiatric/Behavioral: Negative for confusion.     Physical Exam Updated Vital Signs BP (!) 143/90   Pulse 68   Temp 98.2 F (36.8 C) (Oral)   Resp  14   Ht 1.854 m (6\' 1" )   Wt 108.9 kg (240 lb)   SpO2 98%   BMI 31.66 kg/m   Physical Exam  Constitutional: He appears well-developed and well-nourished. No distress.  HENT:  Mouth/Throat: Oropharynx is clear and moist.  Eyes: Conjunctivae are normal.  Neck: Neck supple. No tracheal deviation present. No thyromegaly present.  Cardiovascular: Normal rate, regular rhythm, normal heart sounds and intact distal pulses. Exam reveals no gallop and no friction rub.  No murmur heard. Pulmonary/Chest: Effort normal and breath sounds normal. No accessory muscle usage. No respiratory distress.  Abdominal: Soft. He exhibits no distension. There is no tenderness.    Musculoskeletal: He exhibits no edema.  Neurological: He is alert.  Skin: Skin is warm and dry. He is not diaphoretic.  Psychiatric: He has a normal mood and affect.  Nursing note and vitals reviewed.    ED Treatments / Results  Labs (all labs ordered are listed, but only abnormal results are displayed) Results for orders placed or performed during the hospital encounter of 28/78/67  Basic metabolic panel  Result Value Ref Range   Sodium 136 135 - 145 mmol/L   Potassium 3.9 3.5 - 5.1 mmol/L   Chloride 103 101 - 111 mmol/L   CO2 24 22 - 32 mmol/L   Glucose, Bld 117 (H) 65 - 99 mg/dL   BUN 16 6 - 20 mg/dL   Creatinine, Ser 1.00 0.61 - 1.24 mg/dL   Calcium 9.4 8.9 - 10.3 mg/dL   GFR calc non Af Amer >60 >60 mL/min   GFR calc Af Amer >60 >60 mL/min   Anion gap 9 5 - 15  CBC  Result Value Ref Range   WBC 6.9 4.0 - 10.5 K/uL   RBC 5.33 4.22 - 5.81 MIL/uL   Hemoglobin 16.8 13.0 - 17.0 g/dL   HCT 47.5 39.0 - 52.0 %   MCV 89.1 78.0 - 100.0 fL   MCH 31.5 26.0 - 34.0 pg   MCHC 35.4 30.0 - 36.0 g/dL   RDW 13.1 11.5 - 15.5 %   Platelets 254 150 - 400 K/uL   EKG  EKG Interpretation  Date/Time:  Sunday November 15 2017 12:12:08 EST Ventricular Rate:  144 PR Interval:    QRS Duration: 80 QT Interval:  282 QTC Calculation: 436 R Axis:   59 Text Interpretation:  Atrial flutter with variable A-V block Nonspecific ST abnormality No previous tracing Confirmed by Lajean Saver [phone]) on 11/15/2017 12:34:15 PM       Radiology No results found.  Procedures Procedures (including critical care time)  Medications Ordered in ED Medications - No data to display   Initial Impression / Assessment and Plan / ED Course  I have reviewed the triage vital signs and the nursing notes.  Pertinent labs & imaging results that were available during my care of the patient were reviewed by me and considered in my medical decision making (see chart for details).  Continuous pulse ox and  monitor. o2 [place]. Labs sent.  Ecg.  Patient spontaneously converted to NSR in ED, and remains in sinus rhythm.  Discussed with cardiology on call - he indicates no need to start anticoag therapy, to rec avoid etoh and have f/u Dr Gwenlyn Found in office.   Pt remains in nsr, and remains asymptomatic in ED.   Final Clinical Impressions(s) / ED Diagnoses   Final diagnoses:  None    ED Discharge Orders    None  Lajean Saver, MD 11/15/17 1430

## 2017-11-15 NOTE — ED Triage Notes (Signed)
Onset this morning pt felt he was in Afib.  Took a dose of Xarelto, Tikosyn this morning.  No chest pain, shortness of breath or any other symptoms.

## 2017-11-15 NOTE — Discharge Instructions (Signed)
It was our pleasure to provide your ER care today - we hope that you feel better.  We discussed your case with your cardiology group - they indicate for you to follow up with them in their afib clinic.  They also recommend minimizing alcohol consumption.   Call office tomorrow to arrange appointment.  Return to ER if worse, persistent fast heart beat, chest pain, trouble breathing, other concern.

## 2017-12-01 ENCOUNTER — Ambulatory Visit: Payer: BLUE CROSS/BLUE SHIELD | Admitting: Cardiovascular Disease

## 2018-03-14 ENCOUNTER — Other Ambulatory Visit: Payer: Self-pay | Admitting: Family Medicine

## 2018-03-14 DIAGNOSIS — F419 Anxiety disorder, unspecified: Secondary | ICD-10-CM

## 2018-03-15 ENCOUNTER — Encounter: Payer: BLUE CROSS/BLUE SHIELD | Admitting: Family Medicine

## 2018-03-15 NOTE — Telephone Encounter (Signed)
Dr. Kuneff pt.  

## 2018-03-22 ENCOUNTER — Encounter: Payer: Self-pay | Admitting: Internal Medicine

## 2018-03-22 ENCOUNTER — Ambulatory Visit (INDEPENDENT_AMBULATORY_CARE_PROVIDER_SITE_OTHER): Payer: BLUE CROSS/BLUE SHIELD | Admitting: Family Medicine

## 2018-03-22 ENCOUNTER — Encounter: Payer: Self-pay | Admitting: Family Medicine

## 2018-03-22 VITALS — BP 128/82 | HR 70 | Temp 97.7°F | Ht 72.5 in | Wt 227.0 lb

## 2018-03-22 DIAGNOSIS — Z23 Encounter for immunization: Secondary | ICD-10-CM | POA: Diagnosis not present

## 2018-03-22 DIAGNOSIS — Z9989 Dependence on other enabling machines and devices: Secondary | ICD-10-CM

## 2018-03-22 DIAGNOSIS — Z114 Encounter for screening for human immunodeficiency virus [HIV]: Secondary | ICD-10-CM

## 2018-03-22 DIAGNOSIS — I482 Chronic atrial fibrillation: Secondary | ICD-10-CM

## 2018-03-22 DIAGNOSIS — Z79899 Other long term (current) drug therapy: Secondary | ICD-10-CM | POA: Diagnosis not present

## 2018-03-22 DIAGNOSIS — I4821 Permanent atrial fibrillation: Secondary | ICD-10-CM

## 2018-03-22 DIAGNOSIS — Z1211 Encounter for screening for malignant neoplasm of colon: Secondary | ICD-10-CM | POA: Diagnosis not present

## 2018-03-22 DIAGNOSIS — F419 Anxiety disorder, unspecified: Secondary | ICD-10-CM

## 2018-03-22 DIAGNOSIS — Z131 Encounter for screening for diabetes mellitus: Secondary | ICD-10-CM | POA: Diagnosis not present

## 2018-03-22 DIAGNOSIS — E7800 Pure hypercholesterolemia, unspecified: Secondary | ICD-10-CM | POA: Diagnosis not present

## 2018-03-22 DIAGNOSIS — Z0000 Encounter for general adult medical examination without abnormal findings: Secondary | ICD-10-CM | POA: Diagnosis not present

## 2018-03-22 DIAGNOSIS — G4733 Obstructive sleep apnea (adult) (pediatric): Secondary | ICD-10-CM | POA: Insufficient documentation

## 2018-03-22 DIAGNOSIS — Z125 Encounter for screening for malignant neoplasm of prostate: Secondary | ICD-10-CM | POA: Diagnosis not present

## 2018-03-22 DIAGNOSIS — E669 Obesity, unspecified: Secondary | ICD-10-CM | POA: Insufficient documentation

## 2018-03-22 DIAGNOSIS — Z1159 Encounter for screening for other viral diseases: Secondary | ICD-10-CM | POA: Diagnosis not present

## 2018-03-22 LAB — CBC WITH DIFFERENTIAL/PLATELET
BASOS PCT: 0.7 % (ref 0.0–3.0)
Basophils Absolute: 0 10*3/uL (ref 0.0–0.1)
EOS ABS: 0.2 10*3/uL (ref 0.0–0.7)
Eosinophils Relative: 3.9 % (ref 0.0–5.0)
HEMATOCRIT: 45.7 % (ref 39.0–52.0)
Hemoglobin: 15.7 g/dL (ref 13.0–17.0)
LYMPHS ABS: 1.4 10*3/uL (ref 0.7–4.0)
Lymphocytes Relative: 24.3 % (ref 12.0–46.0)
MCHC: 34.3 g/dL (ref 30.0–36.0)
MCV: 90.4 fl (ref 78.0–100.0)
Monocytes Absolute: 0.6 10*3/uL (ref 0.1–1.0)
Monocytes Relative: 10.3 % (ref 3.0–12.0)
NEUTROS ABS: 3.5 10*3/uL (ref 1.4–7.7)
NEUTROS PCT: 60.8 % (ref 43.0–77.0)
PLATELETS: 264 10*3/uL (ref 150.0–400.0)
RBC: 5.05 Mil/uL (ref 4.22–5.81)
RDW: 14 % (ref 11.5–15.5)
WBC: 5.8 10*3/uL (ref 4.0–10.5)

## 2018-03-22 LAB — COMPREHENSIVE METABOLIC PANEL
ALT: 20 U/L (ref 0–53)
AST: 25 U/L (ref 0–37)
Albumin: 4.1 g/dL (ref 3.5–5.2)
Alkaline Phosphatase: 81 U/L (ref 39–117)
BILIRUBIN TOTAL: 1.5 mg/dL — AB (ref 0.2–1.2)
BUN: 22 mg/dL (ref 6–23)
CO2: 28 meq/L (ref 19–32)
CREATININE: 0.93 mg/dL (ref 0.40–1.50)
Calcium: 9.4 mg/dL (ref 8.4–10.5)
Chloride: 103 mEq/L (ref 96–112)
GFR: 87.49 mL/min (ref >60.00)
Glucose, Bld: 92 mg/dL (ref 70–99)
Potassium: 4.5 mEq/L (ref 3.5–5.1)
Sodium: 139 mEq/L (ref 135–145)
TOTAL PROTEIN: 6.7 g/dL (ref 6.0–8.3)

## 2018-03-22 LAB — LIPID PANEL
CHOL/HDL RATIO: 4
Cholesterol: 216 mg/dL — ABNORMAL HIGH (ref 0–200)
HDL: 51 mg/dL (ref >39.00)
LDL Cholesterol: 149 mg/dL — ABNORMAL HIGH (ref 0–99)
NONHDL: 165.13
TRIGLYCERIDES: 79 mg/dL (ref 0.0–149.0)
VLDL: 15.8 mg/dL (ref 0.0–40.0)

## 2018-03-22 LAB — HEMOGLOBIN A1C: Hgb A1c MFr Bld: 5.8 % (ref 4.6–6.5)

## 2018-03-22 LAB — PSA: PSA: 1.51 ng/mL (ref 0.10–4.00)

## 2018-03-22 LAB — TSH: TSH: 2.39 u[IU]/mL (ref 0.35–4.50)

## 2018-03-22 MED ORDER — ATORVASTATIN CALCIUM 40 MG PO TABS: 40.0000 mg | ORAL_TABLET | Freq: Every day | ORAL | 3 refills | Status: DC

## 2018-03-22 MED ORDER — ESCITALOPRAM OXALATE 5 MG PO TABS: 5.0000 mg | ORAL_TABLET | Freq: Every day | ORAL | 1 refills | Status: DC

## 2018-03-22 NOTE — Progress Notes (Signed)
Patient ID: Johnathan [name], male  DOB: 08-Aug-1956, 62 y.o.   MRN: [id number] Patient Care Team    Relationship Specialty Notifications Start End  Ma Hillock, DO PCP - General Family Medicine  04/15/17   Lorretta Harp, MD Consulting Physician Cardiology  03/22/18   Silverio Decamp, MD Consulting Physician Family Medicine  03/22/18     Chief Complaint  Patient presents with  . Annual Exam    CPE    Subjective:  Johnathan [name] is a 62 y.o. male present for CPE. All past medical history, surgical history, allergies, family history, immunizations, medications and social history were updated in the electronic medical record today. All recent labs, ED visits and hospitalizations within the last year were reviewed.  Health maintenance:  Colonoscopy: last screen ~2007, recommend follow up 10 yr;Completed by out of town and records are lost. Referral placed today. . Immunizations:  tdap OD--> completed today 03/22/2018, influenza declined today recs yearly. Offer shingrix next visit.  Infectious disease screening: HIV and Hep C completed today. If not completed prior, screening test offered. PSA:  Lab Results  Component Value Date   PSA 1.0 04/16/2015  , pt was counseled on prostate cancer screenings. No fhx, no urinary changes Assistive device: None Oxygen TUU:EKCM Patient has a Dental home. Hospitalizations/ED visits: Reviewed.   Depression screen [facility] 2/9 03/22/2018 08/07/2017 04/15/2017  Decreased Interest 0 0 0  Down, Depressed, Hopeless 0 0 0  PHQ - 2 Score 0 0 0  Altered sleeping - 0 -  Tired, decreased energy - 0 -  Change in appetite - 0 -  Feeling bad or failure about yourself  - 0 -  Trouble concentrating - 0 -  Moving slowly or fidgety/restless - 0 -  Suicidal thoughts - 0 -  PHQ-9 Score - 0 -   GAD 7 : Generalized Anxiety Score 08/07/2017  Nervous, Anxious, on Edge 0  Control/stop worrying 0  Worry too much - different things 0  Trouble relaxing 0    Restless 0  Easily annoyed or irritable 0  Afraid - awful might happen 0  Total GAD 7 Score 0     Current Exercise Habits: Home exercise routine, Time (Minutes): 60, Frequency (Times/Week): 7, Weekly Exercise (Minutes/Week): 420, Intensity: Moderate   Fall Risk  03/22/2018 04/15/2017  Falls in the past year? No No      Immunization History  Administered Date(s) Administered  . Tdap 03/22/2018     Past Medical History:  Diagnosis Date  . A-fib ([place])   . Heart murmur   . Hyperlipidemia   . Hypertension   . RLS (restless legs syndrome)   . Sleep apnea    CPAP   Allergies  Allergen Reactions  . Sulfa Antibiotics     Unknown reaction in childhood   Past Surgical History:  Procedure Laterality Date  . ATRIAL FIBRILLATION ABLATION  2015  . FOOT SURGERY    . HERNIA REPAIR    . LUMBAR [place] SURGERY  2012   L3-L4, No hardware   Family History  Problem Relation Age of Onset  . Diabetes Mother   . Diabetes Brother   . Early death Brother   . Heart disease Brother    Social History   Socioeconomic History  . Marital status: Married    Spouse name: [name]  . Number of children: 2  . Years of education: 67  . Highest education level: Not on file  Occupational History  .  Not on file  Social Needs  . Financial resource strain: Not on file  . Food insecurity:    Worry: Not on file    Inability: Not on file  . Transportation needs:    Medical: Not on file    Non-medical: Not on file  Tobacco Use  . Smoking status: Never Smoker  . Smokeless tobacco: Former Systems developer    Types: Chew  Substance and Sexual Activity  . Alcohol use: Yes    Alcohol/week: 6.0 oz    Types: 10 Cans of beer per week  . Drug use: No  . Sexual activity: Yes    Partners: Female    Comment: Married  Lifestyle  . Physical activity:    Days per week: Not on file    Minutes per session: Not on file  . Stress: Not on file  Relationships  . Social connections:    Talks on phone: Not on  file    Gets together: Not on file    Attends religious service: Not on file    Active member of club or organization: Not on file    Attends meetings of clubs or organizations: Not on file    Relationship status: Not on file  . Intimate partner violence:    Fear of current or ex partner: Not on file    Emotionally abused: Not on file    Physically abused: Not on file    Forced sexual activity: Not on file  Other Topics Concern  . Not on file  Social History Narrative   Married to [place]. 2 children [place].    BS degree.    Wear seatbelt. Wears a bicycle helmet. Smoke detector in the home.   Firearms in the home.   Feels safe in  relationships.   Allergies as of 03/22/2018      Reactions   Sulfa Antibiotics    Unknown reaction in childhood      Medication List        Accurate as of 03/22/18  9:02 AM. Always use your most recent med list.          atorvastatin 40 MG tablet Commonly known as:  LIPITOR Take 1 tablet (40 mg total) by mouth daily. Patient needs follow up/CPE for further refills.   dofetilide 500 MCG capsule Commonly known as:  TIKOSYN Take 1 capsule (500 mcg total) by mouth 2 (two) times daily.   escitalopram 5 MG tablet Commonly known as:  LEXAPRO Take 1 tablet (5 mg total) by mouth daily.      All past medical history, surgical history, allergies, family history, immunizations andmedications were updated in the EMR today and reviewed under the history and medication portions of their EMR.     No results found for this or any previous visit (from the past 2160 hour(s)).  No results found.   ROS: 14 pt review of systems performed and negative (unless mentioned in an HPI)  Objective: BP 128/82 (BP Location: Left Arm, Patient Position: Sitting, Cuff Size: Large)   Pulse 70   Temp 97.7 F (36.5 C) (Oral)   Ht 6' 0.5" (1.842 m)   Wt 227 lb (103 kg)   SpO2 99%   BMI 30.36 kg/m  Gen: Afebrile. No acute distress. Nontoxic in  appearance, well-developed, well-nourished,  Pleasant caucasian male.  HENT: AT. [place]. Bilateral TM visualized and normal in appearance, normal external auditory canal. MMM, no oral lesions, adequate dentition. Bilateral nares within normal limits. Throat without  erythema, ulcerations or exudates. no Cough on exam, no hoarseness on exam. Eyes:Pupils Equal Round Reactive to light, Extraocular movements intact,  Conjunctiva without redness, discharge or icterus. Neck/lymp/endocrine: Supple,no lymphadenopathy, no thyromegaly CV: RRR no murmur, no edema, +2/4 P posterior tibialis pulses. no carotid bruits. No JVD. Chest: CTAB, no wheeze, rhonchi or crackles. normal Respiratory effort. good Air movement. Abd: Soft. flat. NTND. BS present. no Masses palpated. No hepatosplenomegaly. No rebound tenderness or guarding. Skin: no rashes, purpura or petechiae. Warm and well-perfused. Skin intact. Neuro/Msk:  Normal gait. PERLA. EOMi. Alert. Oriented x3.  Cranial nerves II through XII intact. Muscle strength 5/5 upper/lower extremity. DTRs equal bilaterally. Psych: Normal affect, dress and demeanor. Normal speech. Normal thought content and judgment.  No exam data present  Assessment/plan: Johnathan [name] is a 62 y.o. male present for CPE Permanent atrial fibrillation (HCC) Stable.  Established w/ cards Dr. Gwenlyn Found. Continue routine follow ups. - star daily asa 81. Hold if xarelto is used. He does not use xarelto hardly at all.  - Comprehensive metabolic panel - CBC with Differential/Platelet Anxiety - stable.  - TSH - escitalopram (LEXAPRO) 5 MG tablet; Take 1 tablet (5 mg total) by mouth daily.  Dispense: 90 tablet; Refill: 1 Pure hypercholesterolemia/Morbid obesity ([place]) He has not been taking lipitor > 7  Months. Refills provided today.  Diet and exercise modifications discussed.  - Lipid panel - atorvastatin (LIPITOR) 40 MG tablet; Take 1 tablet (40 mg total) by mouth daily. Patient needs follow up/CPE  for further refills.  Dispense: 90 tablet; Refill: 3 Encounter for screening for HIV - HIV antibody (with reflex) Need for hepatitis C screening test - Hepatitis C Antibody OSA on CPAP - needs new CPAP and study >10 years - Ambulatory referral to Pulmonology Colon cancer screening - Ambulatory referral to Gastroenterology Diabetes mellitus screening - Hemoglobin A1c Prostate cancer screening - PSA  Immunization due - Tdap vaccine greater than or equal to 7yo IM Encounter for preventive health examination Patient was encouraged to exercise greater than 150 minutes a week. Patient was encouraged to choose a diet filled with fresh fruits and vegetables, and lean meats. AVS provided to patient today for education/recommendation on gender specific health and safety maintenance. Colonoscopy: last screen ~2007, recommend follow up 10 yr;Completed by out of town and records are lost. Referral placed today. . Immunizations:  tdap OD--> completed today 03/22/2018, influenza declined today recs yearly. Offer shingrix next visit.  Infectious disease screening: HIV and Hep C completed today. If not completed prior, screening test offered. PSA: collected today  Return in about 1 year (around 03/23/2019) for CPE.  Note is dictated utilizing voice recognition software. Although note has been proof read prior to signing, occasional typographical errors still can be missed. If any questions arise, please do not hesitate to call for verification.  Electronically signed by: Howard Pouch, DO [place]

## 2018-03-22 NOTE — Patient Instructions (Signed)
Referred you to Pulmonology to get CPAP updated Referred to have colonoscopy completed.  Tetanus updated.  Please start baby ASA daily.   Continue follow ups as asked w/ cardiology.    Health Maintenance, Male A healthy lifestyle and preventive care is important for your health and wellness. Ask your health care provider about what schedule of regular examinations is right for you. What should I know about weight and diet? Eat a Healthy Diet  Eat plenty of vegetables, fruits, whole grains, low-fat dairy products, and lean protein.  Do not eat a lot of foods high in solid fats, added sugars, or salt.  Maintain a Healthy Weight Regular exercise can help you achieve or maintain a healthy weight. You should:  Do at least 150 minutes of exercise each week. The exercise should increase your heart rate and make you sweat (moderate-intensity exercise).  Do strength-training exercises at least twice a week.  Watch Your Levels of Cholesterol and Blood Lipids  Have your blood tested for lipids and cholesterol every 5 years starting at 62 years of age. If you are at high risk for heart disease, you should start having your blood tested when you are 62 years old. You may need to have your cholesterol levels checked more often if: ? Your lipid or cholesterol levels are high. ? You are older than 62 years of age. ? You are at high risk for heart disease.  What should I know about cancer screening? Many types of cancers can be detected early and may often be prevented. Lung Cancer  You should be screened every year for lung cancer if: ? You are a current smoker who has smoked for at least 30 years. ? You are a former smoker who has quit within the past 15 years.  Talk to your health care provider about your screening options, when you should start screening, and how often you should be screened.  Colorectal Cancer  Routine colorectal cancer screening usually begins at 62 years of age and  should be repeated every 5-10 years until you are 62 years old. You may need to be screened more often if early forms of precancerous polyps or small growths are found. Your health care provider may recommend screening at an earlier age if you have risk factors for colon cancer.  Your health care provider may recommend using home test kits to check for hidden blood in the stool.  A small camera at the end of a tube can be used to examine your colon (sigmoidoscopy or colonoscopy). This checks for the earliest forms of colorectal cancer.  Prostate and Testicular Cancer  Depending on your age and overall health, your health care provider may do certain tests to screen for prostate and testicular cancer.  Talk to your health care provider about any symptoms or concerns you have about testicular or prostate cancer.  Skin Cancer  Check your skin from head to toe regularly.  Tell your health care provider about any new moles or changes in moles, especially if: ? There is a change in a mole's size, shape, or color. ? You have a mole that is larger than a pencil eraser.  Always use sunscreen. Apply sunscreen liberally and repeat throughout the day.  Protect yourself by wearing long sleeves, pants, a wide-brimmed hat, and sunglasses when outside.  What should I know about heart disease, diabetes, and high blood pressure?  If you are 49-71 years of age, have your blood pressure checked every 3-5 years. If  you are 25 years of age or older, have your blood pressure checked every year. You should have your blood pressure measured twice-once when you are at a hospital or clinic, and once when you are not at a hospital or clinic. Record the average of the two measurements. To check your blood pressure when you are not at a hospital or clinic, you can use: ? An automated blood pressure machine at a pharmacy. ? A home blood pressure monitor.  Talk to your health care provider about your target blood  pressure.  If you are between 46-17 years old, ask your health care provider if you should take aspirin to prevent heart disease.  Have regular diabetes screenings by checking your fasting blood sugar level. ? If you are at a normal weight and have a low risk for diabetes, have this test once every three years after the age of 51. ? If you are overweight and have a high risk for diabetes, consider being tested at a younger age or more often.  A one-time screening for abdominal aortic aneurysm (AAA) by ultrasound is recommended for men aged 59-75 years who are current or former smokers. What should I know about preventing infection? Hepatitis B If you have a higher risk for hepatitis B, you should be screened for this virus. Talk with your health care provider to find out if you are at risk for hepatitis B infection. Hepatitis C Blood testing is recommended for:  Everyone born from [birth year] through 1965.  Anyone with known risk factors for hepatitis C.  Sexually Transmitted Diseases (STDs)  You should be screened each year for STDs including gonorrhea and chlamydia if: ? You are sexually active and are younger than 62 years of age. ? You are older than 62 years of age and your health care provider tells you that you are at risk for this type of infection. ? Your sexual activity has changed since you were last screened and you are at an increased risk for chlamydia or gonorrhea. Ask your health care provider if you are at risk.  Talk with your health care provider about whether you are at high risk of being infected with HIV. Your health care provider may recommend a prescription medicine to help prevent HIV infection.  What else can I do?  Schedule regular health, dental, and eye exams.  Stay current with your vaccines (immunizations).  Do not use any tobacco products, such as cigarettes, chewing tobacco, and e-cigarettes. If you need help quitting, ask your health care  provider.  Limit alcohol intake to no more than 2 drinks per day. One drink equals 12 ounces of beer, 5 ounces of wine, or 1 ounces of hard liquor.  Do not use street drugs.  Do not share needles.  Ask your health care provider for help if you need support or information about quitting drugs.  Tell your health care provider if you often feel depressed.  Tell your health care provider if you have ever been abused or do not feel safe at home. This information is not intended to replace advice given to you by your health care provider. Make sure you discuss any questions you have with your health care provider. Document Released: 06/12/2008 Document Revised: 08/13/2016 Document Reviewed: 09/18/2015 Elsevier Interactive Patient Education  [employer].

## 2018-03-23 ENCOUNTER — Telehealth: Payer: Self-pay | Admitting: Family Medicine

## 2018-03-23 LAB — HIV ANTIBODY (ROUTINE TESTING W REFLEX): HIV 1&2 Ab, 4th Generation: NONREACTIVE

## 2018-03-23 LAB — HEPATITIS C ANTIBODY
Hepatitis C Ab: NONREACTIVE
SIGNAL TO CUT-OFF: 0.01 (ref <1.00)

## 2018-03-23 NOTE — Telephone Encounter (Signed)
I had already left a detailed message with results patient states he didn't check his voice mail reviewed lab results and instructions with patient. Patient verbalized understanding.

## 2018-03-23 NOTE — Telephone Encounter (Signed)
Copied from [place] [id number]. Topic: Quick Communication - See Telephone Encounter >> Mar 23, 2018  4:29 PM [name], NT wrote: CRM for notification. See Telephone encounter for: 03/23/18.  Pt. Calling back stating that he received a call from the office but no voicemail was left he did state he had some lab work done pt. Would like for someone to give him a call back.

## 2018-03-24 ENCOUNTER — Ambulatory Visit (INDEPENDENT_AMBULATORY_CARE_PROVIDER_SITE_OTHER): Payer: BLUE CROSS/BLUE SHIELD | Admitting: Sports Medicine

## 2018-03-24 ENCOUNTER — Encounter: Payer: Self-pay | Admitting: Sports Medicine

## 2018-03-24 DIAGNOSIS — M1811 Unilateral primary osteoarthritis of first carpometacarpal joint, right hand: Secondary | ICD-10-CM | POA: Diagnosis not present

## 2018-03-24 NOTE — Progress Notes (Signed)
Subjective:    CC: Right hand pain  HPI: This is a pleasant 62 year old male with known right trapeziometacarpal joint osteoarthritis, we did an injection about 11 months ago, things are going well until 2 weeks ago, he started to have a recurrence of pain, worsening, persistent, localized without radiation.  Oral NSAIDs, activity modification has not been helpful and he is here to consider repeat injection.  I reviewed the past medical history, family history, social history, surgical history, and allergies today and no changes were needed.  Please see the problem list section below in epic for further details.  Past Medical History: Past Medical History:  Diagnosis Date  . A-fib ([place])   . Heart murmur   . Hyperlipidemia   . Hypertension   . RLS (restless legs syndrome)   . Sleep apnea    CPAP   Past Surgical History: Past Surgical History:  Procedure Laterality Date  . ATRIAL FIBRILLATION ABLATION  2015  . FOOT SURGERY    . HERNIA REPAIR    . LUMBAR [place] SURGERY  2012   L3-L4, No hardware   Social History: Social History   Socioeconomic History  . Marital status: Married    Spouse name: [name]  . Number of children: 2  . Years of education: 23  . Highest education level: Not on file  Occupational History  . Not on file  Social Needs  . Financial resource strain: Not on file  . Food insecurity:    Worry: Not on file    Inability: Not on file  . Transportation needs:    Medical: Not on file    Non-medical: Not on file  Tobacco Use  . Smoking status: Never Smoker  . Smokeless tobacco: Former Systems developer    Types: Chew  Substance and Sexual Activity  . Alcohol use: Yes    Alcohol/week: 6.0 oz    Types: 10 Cans of beer per week  . Drug use: No  . Sexual activity: Yes    Partners: Female    Comment: Married  Lifestyle  . Physical activity:    Days per week: Not on file    Minutes per session: Not on file  . Stress: Not on file  Relationships  . Social  connections:    Talks on phone: Not on file    Gets together: Not on file    Attends religious service: Not on file    Active member of club or organization: Not on file    Attends meetings of clubs or organizations: Not on file    Relationship status: Not on file  Other Topics Concern  . Not on file  Social History Narrative   Married to [place]. 2 children [place].    BS degree.    Wear seatbelt. Wears a bicycle helmet. Smoke detector in the home.   Firearms in the home.   Feels safe in  relationships.   Family History: Family History  Problem Relation Age of Onset  . Diabetes Mother   . Diabetes Brother   . Early death Brother   . Heart disease Brother    Allergies: Allergies  Allergen Reactions  . Sulfa Antibiotics     Unknown reaction in childhood   Medications: See med rec.  Review of Systems: No fevers, chills, night sweats, weight loss, chest pain, or shortness of breath.   Objective:    General: Well Developed, well nourished, and in no acute distress.  Neuro: Alert and oriented x3, extra-ocular muscles  intact, sensation grossly intact.  HEENT: Normocephalic, atraumatic, pupils equal round reactive to light, neck supple, no masses, no lymphadenopathy, thyroid nonpalpable.  Skin: Warm and dry, no rashes. Cardiac: Regular rate and rhythm, no murmurs rubs or gallops, no lower extremity edema.  Respiratory: Clear to auscultation bilaterally. Not using accessory muscles, speaking in full sentences. Right hand: Swollen, minimally squared off appearance of the thumb basal joint with tenderness to palpation.  Procedure: Real-time Ultrasound Guided Injection of right trapeziometacarpal joint Device: GE Logiq E  Verbal informed consent obtained.  Time-out conducted.  Noted no overlying erythema, induration, or other signs of local infection.  Skin prepped in a sterile fashion.  Local anesthesia: Topical Ethyl chloride.  With sterile technique and under  real time ultrasound guidance: Using a 25-gauge needle advanced into the joint and injected 1/2 cc kenalog 40, 1/2 cc lidocaine slowly Completed without difficulty  Pain immediately resolved suggesting accurate placement of the medication.  Advised to call if fevers/chills, erythema, induration, drainage, or persistent bleeding.  Images permanently stored and available for review in the ultrasound unit.  Impression: Technically successful ultrasound guided injection.  Impression and Recommendations:    Primary osteoarthritis of first carpometacarpal joint of right hand 10-67-month response to previous injection, repeated today, return as needed.  ___________________________________________ Gwen Her. Dianah Field, M.D., ABFM., CAQSM. Primary Care and [place] Instructor of [place] [employer] [facility] of Medicine

## 2018-03-24 NOTE — Assessment & Plan Note (Signed)
10-2-month response to previous injection, repeated today, return as needed.

## 2018-04-21 ENCOUNTER — Ambulatory Visit (INDEPENDENT_AMBULATORY_CARE_PROVIDER_SITE_OTHER): Payer: BLUE CROSS/BLUE SHIELD | Admitting: Pulmonary Disease

## 2018-04-21 ENCOUNTER — Encounter: Payer: Self-pay | Admitting: Pulmonary Disease

## 2018-04-21 VITALS — BP 130/88 | HR 70 | Ht 73.0 in | Wt 226.8 lb

## 2018-04-21 DIAGNOSIS — G4733 Obstructive sleep apnea (adult) (pediatric): Secondary | ICD-10-CM | POA: Diagnosis not present

## 2018-04-21 NOTE — Progress Notes (Signed)
   Subjective:    Patient ID: Johnathan [name], male    DOB: August 16, 1956, 62 y.o.   MRN: [id number]  HPI    Review of Systems  Constitutional: Negative for fever and unexpected weight change.  HENT: Negative for congestion, dental problem, ear pain, nosebleeds, postnasal drip, rhinorrhea, sinus pressure, sneezing, sore throat and trouble swallowing.   Eyes: Negative for redness and itching.  Respiratory: Negative for cough, chest tightness, shortness of breath and wheezing.   Cardiovascular: Negative for palpitations and leg swelling.  Gastrointestinal: Negative for nausea and vomiting.  Genitourinary: Negative for dysuria.  Musculoskeletal: Negative for joint swelling.  Skin: Negative for rash.  Allergic/Immunologic: Negative.  Negative for environmental allergies, food allergies and immunocompromised state.  Neurological: Negative for headaches.  Hematological: Does not bruise/bleed easily.  Psychiatric/Behavioral: Negative for dysphoric mood. The patient is not nervous/anxious.        Objective:   Physical Exam        Assessment & Plan:

## 2018-04-21 NOTE — Progress Notes (Signed)
[facility]  Chief Complaint  Patient presents with  . sleep consult    Pt referred by Dr. Howard Pouch MD. Pt has old cpap machine longer than 62 yrs old would like new one. DME-[facility]    Vital signs: BP 130/88 (BP Location: Left Arm, Cuff Size: Normal)   Pulse 70   Ht 6\' 1"  (1.854 m)   Wt 226 lb 12.8 oz (102.9 kg)   SpO2 99%   BMI 29.92 kg/m   History of Present Illness: Johnathan [name] is a 62 y.o. male for evaluation of sleep problems.  He had sleep study in [place].  He was started on CPAP.  He uses full face mask.  Uses [facility].  His machine is about 62 yrs old.  He goes to sleep at 10 pm.  He falls asleep in 5 minutes.  He wakes up 2 or 3 times to use the bathroom.  He gets out of bed at 6 am.  He feels okay in the morning.  He denies morning headache.  He does not use anything to help him fall sleep or stay awake.  He denies sleep walking, sleep talking, bruxism, or nightmares.  There is no history of restless legs.  He denies sleep hallucinations, sleep paralysis, or cataplexy.  The Epworth score is 3 out of 24.    Physical Exam:  General - pleasant Eyes - pupils reactive ENT - no sinus tenderness, no oral exudate, no LAN, MP 2, scalloped tongue Cardiac - regular, no murmur Chest - no wheeze, rales Abd - soft, non tender Ext - no edema Skin - no rashes Neuro - normal strength Psych - normal mood  Assessment/Plan:  Obstructive sleep apnea. - will arrange for new CPAP machine and mask since his machine is more than 62 yrs old - will try to get copies of sleep studies from [facility] - explained he might need new sleep study > could do home sleep study   Patient Instructions  Will arrange for new CPAP set up  Follow up in 2 months    Chesley Mires, MD [place] 04/21/2018, 11:50 AM  Flow Sheet  Sleep tests:  Review of Systems: Constitutional: Negative for fever and unexpected weight change.    HENT: Negative for congestion, dental problem, ear pain, nosebleeds, postnasal drip, rhinorrhea, sinus pressure, sneezing, sore throat and trouble swallowing.   Eyes: Negative for redness and itching.  Respiratory: Negative for cough, chest tightness, shortness of breath and wheezing.   Cardiovascular: Negative for palpitations and leg swelling.  Gastrointestinal: Negative for nausea and vomiting.  Genitourinary: Negative for dysuria.  Musculoskeletal: Negative for joint swelling.  Skin: Negative for rash.  Allergic/Immunologic: Negative.  Negative for environmental allergies, food allergies and immunocompromised state.  Neurological: Negative for headaches.  Hematological: Does not bruise/bleed easily.  Psychiatric/Behavioral: Negative for dysphoric mood. The patient is not nervous/anxious.    Past Medical History: He  has a past medical history of A-fib ([place]), Heart murmur, Hyperlipidemia, Hypertension, RLS (restless legs syndrome), and Sleep apnea.  Past Surgical History: He  has a past surgical history that includes [place] (2015); Foot surgery; Lumbar disc surgery (2012); and Hernia repair.  Family History: His family history includes Diabetes in his brother and mother; Early death in his brother; Heart disease in his brother.  Social History: He  reports that he has never smoked. He has quit using smokeless tobacco. His smokeless tobacco use included chew. He reports that  he drinks about 6.0 oz of alcohol per week. He reports that he does not use drugs.  Medications: Allergies as of 04/21/2018      Reactions   Sulfa Antibiotics    Unknown reaction in childhood      Medication List        Accurate as of 04/21/18 11:50 AM. Always use your most recent med list.          atorvastatin 40 MG tablet Commonly known as:  LIPITOR Take 1 tablet (40 mg total) by mouth daily. Patient needs follow up/CPE for further refills.   dofetilide 500 MCG  capsule Commonly known as:  TIKOSYN Take 1 capsule (500 mcg total) by mouth 2 (two) times daily.   escitalopram 5 MG tablet Commonly known as:  LEXAPRO Take 1 tablet (5 mg total) by mouth daily.

## 2018-04-21 NOTE — Patient Instructions (Signed)
Will arrange for new CPAP set up Follow up in 2 months 

## 2018-05-03 ENCOUNTER — Ambulatory Visit: Payer: BLUE CROSS/BLUE SHIELD | Admitting: Internal Medicine

## 2018-05-19 ENCOUNTER — Ambulatory Visit: Payer: BLUE CROSS/BLUE SHIELD | Admitting: Internal Medicine

## 2018-05-25 ENCOUNTER — Ambulatory Visit: Payer: Self-pay

## 2018-05-25 ENCOUNTER — Ambulatory Visit: Payer: BLUE CROSS/BLUE SHIELD | Admitting: Sports Medicine

## 2018-05-25 DIAGNOSIS — M7050 Other bursitis of knee, unspecified knee: Secondary | ICD-10-CM | POA: Diagnosis not present

## 2018-05-25 NOTE — Telephone Encounter (Signed)
Pt c/o right knee cap edema and redness. Pt states that it is painful to touch. Pt states that he was kneeling in his yard and pt thinks that may be the cause of this. Per protocol, pt advised to go to [facility] for tx within 4  Hours. No availabilities at PCP office for today. Care advice given.  Reason for Disposition . [1] Redness AND [2] painful when touched AND [3] no fever  Answer Assessment - Initial Assessment Questions 1. LOCATION: "Where is the swelling located?"  (e.g., left, right, both knees)     right 2. SIZE and DESCRIPTION: "What does the swelling look like?"  (e.g., entire knee, localized)     Knee cap 3. ONSET: "When did the swelling start?" "Does it come and go, or is it there all the time?"     yesterday 4. PAIN: "Is there any pain?" If so, ask: "How bad is it?" (Scale 1-10; or mild, moderate, severe)     no 5. SETTING: "Has there been any recent work, exercise or other activity that involved that part of the body?"      Kneeling on it while working in yard 6. AGGRAVATING FACTORS: "What makes the knee swelling worse?" (e.g., walking, climbing stairs, running)     If he kneels on knee 7. ASSOCIATED SYMPTOMS: "Is there any pain or redness?"    Redness, no pain 8. OTHER SYMPTOMS: "Do you have any other symptoms?" (e.g., chest pain, difficulty breathing, fever, calf pain)     no 9. PREGNANCY: "Is there any chance you are pregnant?" "When was your last menstrual period?"     n/a  Protocols used: KNEE [facility]

## 2018-06-08 IMAGING — DX DG HAND COMPLETE 3+V*R*
3 series · 3 of 3 positions shown · non-contrast
Comparison: None.

CLINICAL DATA: Pain, primarily laterally

EXAM:
RIGHT HAND - COMPLETE 3+ VIEW

[hand pa]
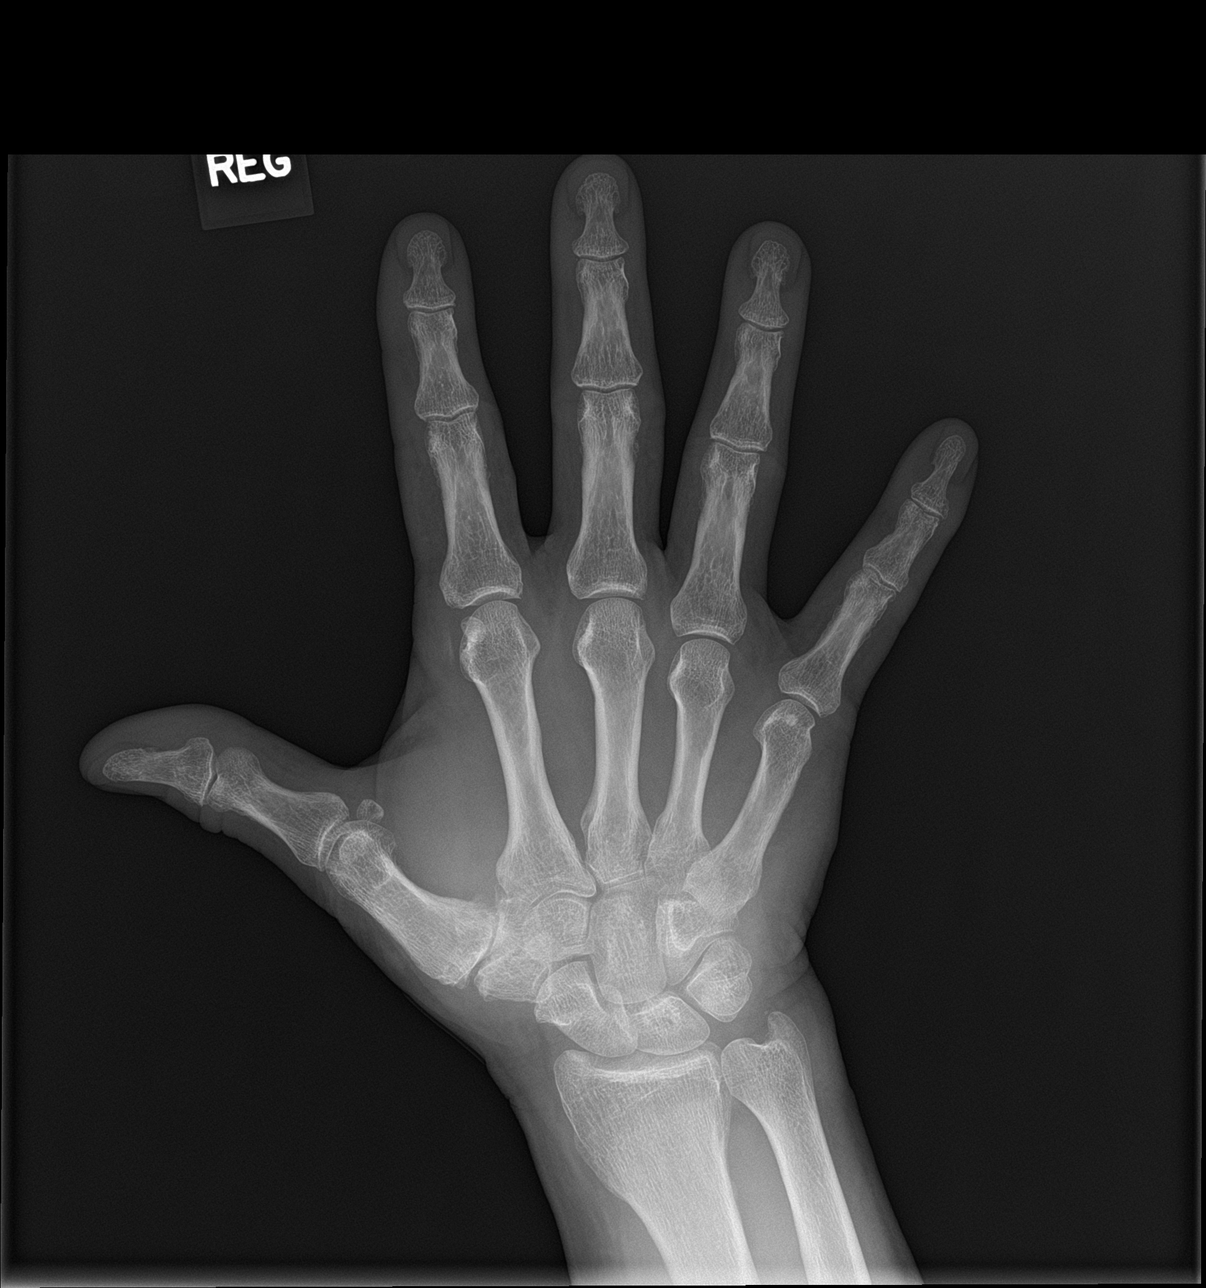

[hand obl]
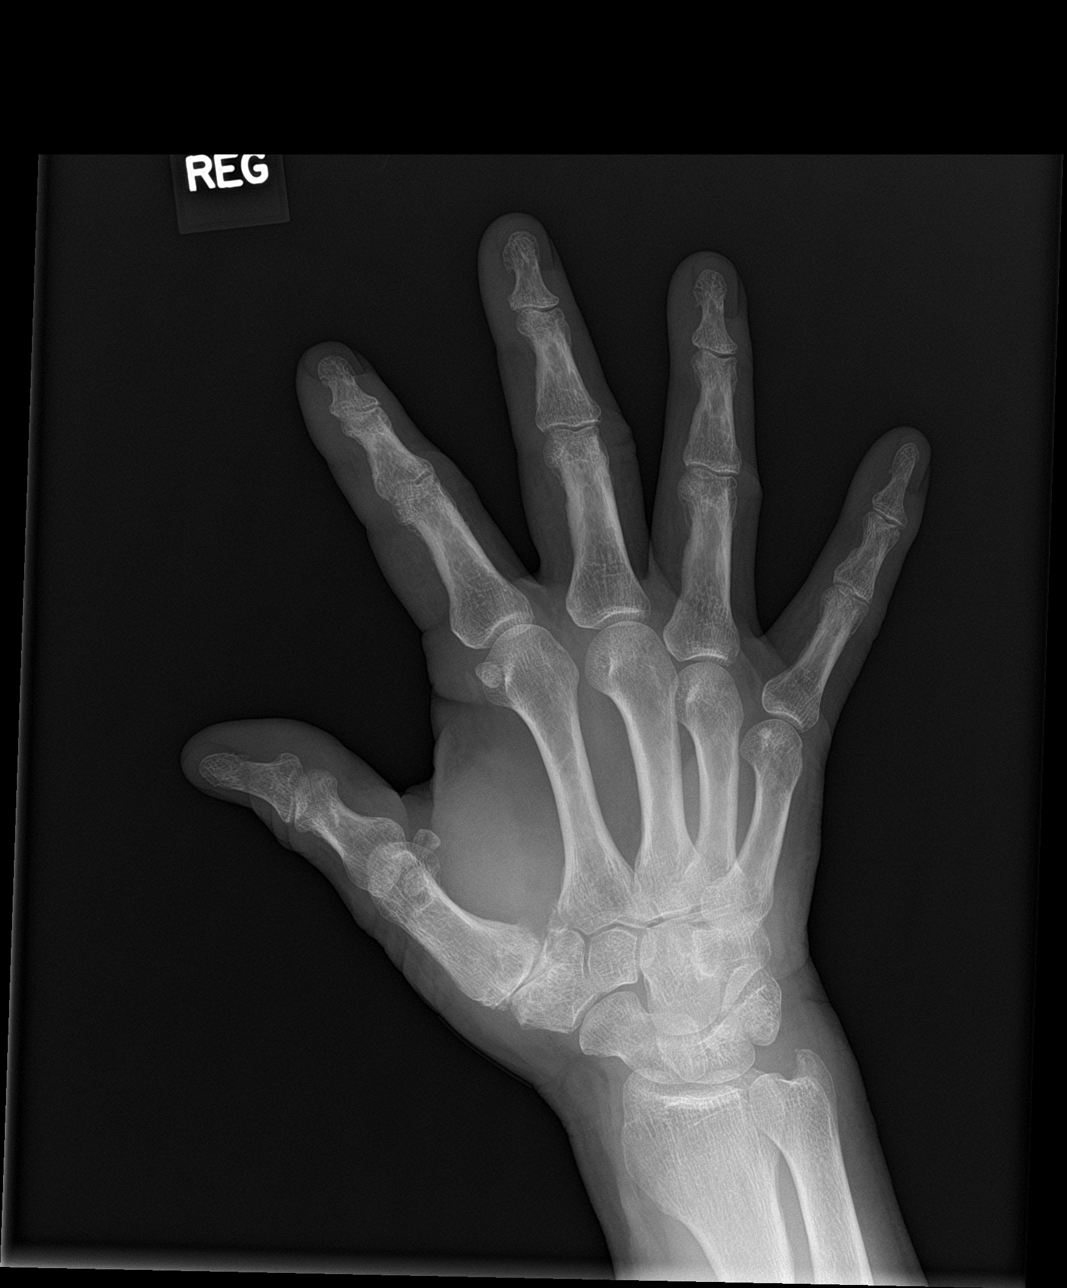

[hand lat]
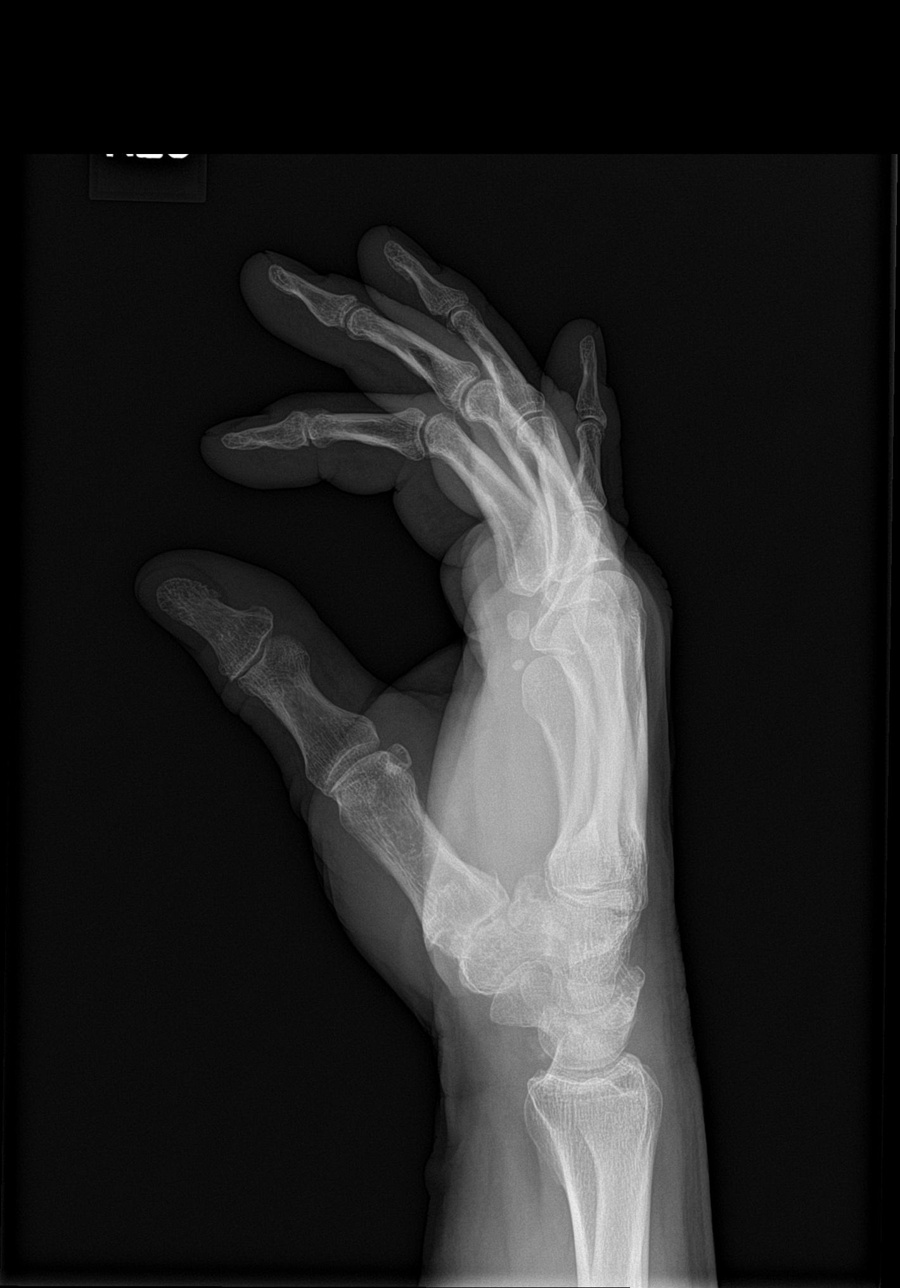

[3 of 3 positions shown; findings below may reference images not displayed]

FINDINGS: Frontal, oblique, and lateral views were obtained. No fracture or
dislocation. There is moderate osteoarthritic change in the first
carpal -metacarpal joint. There is mild periostitis involving
portions of the second, third, fourth, and fifth proximal phalanges.
There is no erosive change. Other joint spaces appear normal. Bony
mineralization is within normal limits.
IMPRESSION: Periostitis in several proximal phalanges without associated joint
space erosion or joint space narrowing. Etiology of this finding is
uncertain.

There is moderate osteoarthritic change in the first carpal
-metacarpal joint the joint spaces appear unremarkable. No fracture
or dislocation.

## 2018-06-17 ENCOUNTER — Encounter: Payer: Self-pay | Admitting: Family Medicine

## 2018-10-11 ENCOUNTER — Other Ambulatory Visit: Payer: Self-pay | Admitting: *Deleted

## 2018-10-11 DIAGNOSIS — F419 Anxiety disorder, unspecified: Secondary | ICD-10-CM

## 2018-10-11 MED ORDER — ESCITALOPRAM OXALATE 5 MG PO TABS: 5.0000 mg | ORAL_TABLET | Freq: Every day | ORAL | 0 refills | Status: DC

## 2018-10-11 NOTE — Telephone Encounter (Signed)
lexapro refilled for 30 day supply left message for patient needs office visit prior to any additional refills.

## 2018-10-16 ENCOUNTER — Other Ambulatory Visit: Payer: Self-pay | Admitting: Cardiovascular Disease

## 2018-10-16 DIAGNOSIS — J019 Acute sinusitis, unspecified: Secondary | ICD-10-CM | POA: Diagnosis not present

## 2018-10-19 ENCOUNTER — Other Ambulatory Visit: Payer: Self-pay

## 2018-10-19 MED ORDER — DOFETILIDE 500 MCG PO CAPS: 500.0000 ug | ORAL_CAPSULE | Freq: Two times a day (BID) | ORAL | 1 refills | Status: DC

## 2018-10-19 NOTE — Telephone Encounter (Signed)
Rx request sent to pharmacy.  

## 2018-11-08 ENCOUNTER — Other Ambulatory Visit: Payer: Self-pay | Admitting: *Deleted

## 2018-11-08 DIAGNOSIS — F419 Anxiety disorder, unspecified: Secondary | ICD-10-CM

## 2018-11-08 MED ORDER — ESCITALOPRAM OXALATE 5 MG PO TABS: 5.0000 mg | ORAL_TABLET | Freq: Every day | ORAL | 0 refills | Status: DC

## 2018-11-08 NOTE — Telephone Encounter (Signed)
14 day supply of escitaloprim sent to pharmacy patient needs an offic visit prior to any additional refills. Sent patient a message in My Chart.

## 2018-11-17 ENCOUNTER — Encounter: Payer: Self-pay | Admitting: Cardiovascular Disease

## 2018-11-17 ENCOUNTER — Ambulatory Visit: Payer: BLUE CROSS/BLUE SHIELD | Admitting: Cardiovascular Disease

## 2018-11-17 VITALS — BP 128/82 | HR 67 | Ht 73.0 in | Wt 232.6 lb

## 2018-11-17 DIAGNOSIS — E7849 Other hyperlipidemia: Secondary | ICD-10-CM | POA: Diagnosis not present

## 2018-11-17 DIAGNOSIS — Z0000 Encounter for general adult medical examination without abnormal findings: Secondary | ICD-10-CM

## 2018-11-17 DIAGNOSIS — I4821 Permanent atrial fibrillation: Secondary | ICD-10-CM

## 2018-11-17 MED ORDER — DOFETILIDE 500 MCG PO CAPS: 500.0000 ug | ORAL_CAPSULE | Freq: Two times a day (BID) | ORAL | 6 refills | Status: DC

## 2018-11-17 NOTE — Assessment & Plan Note (Signed)
History of hyperlipidemia on statin therapy. We will recheck a lipid and liver profile 

## 2018-11-17 NOTE — Assessment & Plan Note (Signed)
History of obstructive sleep apnea on CPAP which he wears sporadically.

## 2018-11-17 NOTE — Assessment & Plan Note (Signed)
History of PAF status post A. fib ablation by Dr. Tally Due in [place] approximately 8 years ago.  He had multiple cardioversions in the past, the last one approximately 2 years ago by Dr. Herbie Baltimore son at [facility].  He is currently on Tikosyn.  He had 3 episodes of A. fib since I saw him a year and a half ago mostly related to alcohol intake.

## 2018-11-17 NOTE — Patient Instructions (Addendum)
Medication Instructions:  Your physician recommends that you continue on your current medications as directed. Please refer to the Current Medication list given to you today.  If you need a refill on your cardiac medications before your next appointment, please call your pharmacy.   Lab work: Your physician recommends that you return for lab work in: FASTING - LIPID/LIVER  If you have labs (blood work) drawn today and your tests are completely normal, you will receive your results only by: [name] MyChart Message (if you have MyChart) OR . A paper copy in the mail If you have any lab test that is abnormal or we need to change your treatment, we will call you to review the results.  Testing/Procedures: none  Follow-Up: At [facility], you and your health needs are our priority.  As part of our continuing mission to provide you with exceptional heart care, we have created designated Provider Care Teams.  These Care Teams include your primary Cardiologist (physician) and Advanced Practice Providers (APPs -  Physician Assistants and Nurse Practitioners) who all work together to provide you with the care you need, when you need it. You will need a follow up appointment in 12 months.  Please call our office 2 months in advance to schedule this appointment.  You may see Dr. Gwenlyn Found or one of the following Advanced Practice Providers on your designated Care Team:   Kerin Ransom, PA-C Roby Lofts, [place] . Sande Rives, PA-C  Any Other Special Instructions Will Be Listed Below (If Applicable).  You have been referred to EP - Dr. Thompson Grayer - Establish Care

## 2018-11-17 NOTE — Progress Notes (Signed)
11/17/2018 [name]   [birth year]  [id number]  Primary Physician Ma Hillock, DO Primary Cardiologist: Lorretta Harp MD Renae Gloss  HPI:  Johnathan [name] is a 62 y.o.  mildly overweight married Caucasian male father of 2 children, grandfather of 1 grandchild accompanied by his wife Johnathan [name]. 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 He was referred by Dr. Kerry Hough D.O. establish her cardiovascular practice because his prior A. fib history.  I last saw him in the office 04/22/2017.  His cardiovascular risk factor profile is notable for treated hyperlipidemia and family history. Brother died of myocardial infarction at age 69. He's never had a heart attack or stroke. He denies chest pain or shortness of breath. He does have obstructive sleep apnea on CPAP which she wears intermittently. He had A. fib ablation at [facility] 8  years ago by Dr. Tally Due. He currently is on Tikosyn. He is not on oral anticoagulation. He said 4-5 DC cardioversions in the past since his ablation, the last one being 2 years ago at [facility] by Dr. Kelli Churn.   Since I saw him a year and a half ago he is remained stable.  He has had 3 episodes of PAF probably related to excessive alcohol intake.   Current Meds  Medication Sig  . atorvastatin (LIPITOR) 40 MG tablet Take 1 tablet (40 mg total) by mouth daily. Patient needs follow up/CPE for further refills.  . dofetilide (TIKOSYN) 500 MCG capsule TAKE 1 CAPSULE (500 MCG TOTAL) BY MOUTH 2 (TWO) TIMES DAILY.  [name] escitalopram (LEXAPRO) 5 MG tablet Take 1 tablet (5 mg total) by mouth daily. Needs office visit prior to any additional refills.     Allergies  Allergen Reactions  . Sulfa Antibiotics     Unknown reaction in childhood    Social History   Socioeconomic History  . Marital status: Married    Spouse name: [name]  . Number of children: 2  . Years of education: 63  .  Highest education level: Not on file  Occupational History  . Not on file  Social Needs  . Financial resource strain: Not on file  . Food insecurity:    Worry: Not on file    Inability: Not on file  . Transportation needs:    Medical: Not on file    Non-medical: Not on file  Tobacco Use  . Smoking status: Never Smoker  . Smokeless tobacco: Former Systems developer    Types: Chew  Substance and Sexual Activity  . Alcohol use: Yes    Alcohol/week: 10.0 standard drinks    Types: 10 Cans of beer per week  . Drug use: No  . Sexual activity: Yes    Partners: Female    Comment: Married  Lifestyle  . Physical activity:    Days per week: Not on file    Minutes per session: Not on file  . Stress: Not on file  Relationships  . Social connections:    Talks on phone: Not on file    Gets together: Not on file    Attends religious service: Not on file    Active member of club or organization: Not on file    Attends meetings of clubs or organizations: Not on file    Relationship status: Not on file  . Intimate partner violence:    Fear of current or ex partner: Not on file  Emotionally abused: Not on file    Physically abused: Not on file    Forced sexual activity: Not on file  Other Topics Concern  . Not on file  Social History Narrative   Married to [place]. 2 children [place].    BS degree.    Wear seatbelt. Wears a bicycle helmet. Smoke detector in the home.   Firearms in the home.   Feels safe in  relationships.     Review of Systems: General: negative for chills, fever, night sweats or weight changes.  Cardiovascular: negative for chest pain, dyspnea on exertion, edema, orthopnea, palpitations, paroxysmal nocturnal dyspnea or shortness of breath Dermatological: negative for rash Respiratory: negative for cough or wheezing Urologic: negative for hematuria Abdominal: negative for nausea, vomiting, diarrhea, bright red blood per rectum, melena, or hematemesis Neurologic:  negative for visual changes, syncope, or dizziness All other systems reviewed and are otherwise negative except as noted above.    Blood pressure 128/82, pulse 67, height 6\' 1"  (1.854 m), weight 232 lb 9.6 oz (105.5 kg), SpO2 98 %.  General appearance: alert and no distress Neck: no adenopathy, no carotid bruit, no JVD, supple, symmetrical, trachea midline and thyroid not enlarged, symmetric, no tenderness/mass/nodules Lungs: clear to auscultation bilaterally Heart: regular rate and rhythm, S1, S2 normal, no murmur, click, rub or gallop Extremities: extremities normal, atraumatic, no cyanosis or edema Pulses: 2+ and symmetric Skin: Skin color, texture, turgor normal. No rashes or lesions Neurologic: Alert and oriented X 3, normal strength and tone. Normal symmetric reflexes. Normal coordination and gait  EKG twelve-lead EKG revealed sinus bradycardia 54 without ST or T wave changes.  Personally reviewed this EKG  ASSESSMENT AND PLAN:   Permanent atrial fibrillation (HCC) History of PAF status post A. fib ablation by Dr. Tally Due in [place] approximately 8 years ago.  He had multiple cardioversions in the past, the last one approximately 2 years ago by Dr. Herbie Baltimore son at [facility].  He is currently on Tikosyn.  He had 3 episodes of A. fib since I saw him a year and a half ago mostly related to alcohol intake.  Hyperlipidemia History of hyperlipidemia on statin therapy.  We will recheck a lipid and liver profile.  OSA on CPAP History of obstructive sleep apnea on CPAP which he wears sporadically.      Lorretta Harp MD FACP,FACC,FAHA, [facility] 11/17/2018 4:47 PM

## 2018-11-19 ENCOUNTER — Encounter: Payer: Self-pay | Admitting: Internal Medicine

## 2018-12-09 ENCOUNTER — Telehealth: Payer: Self-pay | Admitting: Family Medicine

## 2018-12-09 NOTE — Telephone Encounter (Signed)
Copied from [place] [id number]. Topic: Quick Communication - Rx Refill/Question >> Dec 09, 2018  4:12 PM Margot Ables wrote: Medication: escitalopram (LEXAPRO) 5 MG tablet - pt has 2 doses left - 1 for Friday and 1 for Saturday - appt scheduled for 12/15/18 with Dr. Raoul Pitch - pt asking for enough to get to his appt  Has the patient contacted their pharmacy? Yes - told to contact MD for appt Preferred Pharmacy (with phone number or street name): [id number] - [place], [address] [phone] (Phone) [phone] (Fax)

## 2018-12-10 ENCOUNTER — Other Ambulatory Visit: Payer: Self-pay

## 2018-12-10 DIAGNOSIS — F419 Anxiety disorder, unspecified: Secondary | ICD-10-CM

## 2018-12-10 MED ORDER — ESCITALOPRAM OXALATE 5 MG PO TABS: 5.0000 mg | ORAL_TABLET | Freq: Every day | ORAL | 0 refills | Status: DC

## 2018-12-10 NOTE — Telephone Encounter (Signed)
Patient requesting refill on Lexapro, enough to get him through to his appt. 12/13/18. #7 sent in to pharmacy.

## 2018-12-13 ENCOUNTER — Ambulatory Visit: Payer: BLUE CROSS/BLUE SHIELD | Admitting: Internal Medicine

## 2018-12-13 ENCOUNTER — Encounter: Payer: Self-pay | Admitting: Internal Medicine

## 2018-12-13 VITALS — BP 116/70 | HR 58 | Ht 73.0 in | Wt 231.0 lb

## 2018-12-13 DIAGNOSIS — I4819 Other persistent atrial fibrillation: Secondary | ICD-10-CM | POA: Diagnosis not present

## 2018-12-13 DIAGNOSIS — G4733 Obstructive sleep apnea (adult) (pediatric): Secondary | ICD-10-CM

## 2018-12-13 DIAGNOSIS — Z9989 Dependence on other enabling machines and devices: Secondary | ICD-10-CM

## 2018-12-13 NOTE — Patient Instructions (Addendum)
Medication Instructions:  Your physician recommends that you continue on your current medications as directed. Please refer to the Current Medication list given to you today.  Labwork: You will get lab work today BMET and magnesium  Testing/Procedures: Your physician has requested that you have an echocardiogram. Echocardiography is a painless test that uses sound waves to create images of your heart. It provides your doctor with information about the size and shape of your heart and how well your heart's chambers and valves are working. This procedure takes approximately one hour. There are no restrictions for this procedure.  Please schedule for an ECHO  Follow-Up: Your physician wants you to follow-up in: one year with Dr. Rayann Heman.   You will receive a reminder letter in the mail two months in advance. If you don't receive a letter, please call our office to schedule the follow-up appointment.  Any Other Special Instructions Will Be Listed Below (If Applicable).  If you need a refill on your cardiac medications before your next appointment, please call your pharmacy.   Follow up with Dr. Halford Chessman for management of your sleep apnea

## 2018-12-13 NOTE — Progress Notes (Signed)
Electrophysiology Office Note   Date:  12/13/2018   ID:  Johnathan [name], DOB March 09, 1956, MRN [id number]  PCP:  Johnathan Hillock, DO  Cardiologist:  Dr Gwenlyn Found Primary Electrophysiologist: Johnathan Grayer, MD    CC: afib   History of Present Illness: Johnathan [name] is a 62 y.o. male who presents today for electrophysiology evaluation.   He is referred by Dr Gwenlyn Found for EP consultation regarding atrial fibrillation.  He has had afib for at least 10 years.  He underwent prio ablation at [facility] 8 years ago by Dr Rolland Porter.  He has been maintained on tikosyn subsequently.  He has required multiple cardioversion since his prior ablation.  He has also been evaluated at [facility].  He has OSA but uses CPAP.  He drinks occasional ETOH and has afib in this setting.  He has not had afib in over a year.  Today, he denies symptoms of palpitations, chest pain, shortness of breath, orthopnea, PND, lower extremity edema, claudication, dizziness, presyncope, syncope, bleeding, or neurologic sequela. The patient is tolerating medications without difficulties and is otherwise without complaint today.    Past Medical History:  Diagnosis Date  . Hyperlipidemia   . Hypertension    pt denies  . Persistent atrial fibrillation   . RLS (restless legs syndrome)   . Sleep apnea    CPAP   Past Surgical History:  Procedure Laterality Date  . ATRIAL FIBRILLATION ABLATION  2015  . FOOT SURGERY    . HERNIA REPAIR    . LUMBAR [place] SURGERY  2012   L3-L4, No hardware     Current Outpatient Medications  Medication Sig Dispense Refill  . atorvastatin (LIPITOR) 40 MG tablet Take 1 tablet (40 mg total) by mouth daily. Patient needs follow up/CPE for further refills. 90 tablet 3  . dofetilide (TIKOSYN) 500 MCG capsule Take 1 capsule (500 mcg total) by mouth 2 (two) times daily. 60 capsule 6  . escitalopram (LEXAPRO) 5 MG tablet Take 1 tablet (5 mg total) by mouth daily. Needs office visit prior to any additional refills. 7 tablet 0     No current facility-administered medications for this visit.     Allergies:   Sulfa antibiotics   Social History:  The patient  reports that he has never smoked. He has quit using smokeless tobacco.  His smokeless tobacco use included chew. He reports current alcohol use of about 10.0 standard drinks of alcohol per week. He reports that he does not use drugs.   Family History:  The patient's  family history includes Diabetes in his brother and mother; Early death in his brother; Heart disease in his brother.    ROS:  Please see the history of present illness.   All other systems are personally reviewed and negative.    PHYSICAL EXAM: VS:  BP 116/70   Pulse (!) 58   Ht 6\' 1"  (1.854 m)   Wt 231 lb (104.8 kg)   SpO2 98%   BMI 30.48 kg/m  , BMI Body mass index is 30.48 kg/m. GEN: Well nourished, well developed, in no acute distress  HEENT: normal  Neck: no JVD, carotid bruits, or masses Cardiac: RRR; no murmurs, rubs, or gallops,no edema  Respiratory:  clear to auscultation bilaterally, normal work of breathing GI: soft, nontender, nondistended, + BS MS: no deformity or atrophy  Skin: warm and dry  Neuro:  Strength and sensation are intact Psych: euthymic mood, full affect  EKG:  EKG is ordered today. The ekg ordered  today is personally reviewed and shows sinus rhythm 58 bpm, PR 152 msec, QRS 94 msec, Qtc 398 msec   Recent Labs: 03/22/2018: ALT 20; BUN 22; Creatinine, Ser 0.93; Hemoglobin 15.7; Platelets 264.0; Potassium 4.5; Sodium 139; TSH 2.39  personally reviewed   Lipid Panel     Component Value Date/Time   CHOL 216 (H) 03/22/2018 0846   TRIG 79.0 03/22/2018 0846   HDL 51.00 03/22/2018 0846   CHOLHDL 4 03/22/2018 0846   VLDL 15.8 03/22/2018 0846   LDLCALC 149 (H) 03/22/2018 0846   personally reviewed   Wt Readings from Last 3 Encounters:  12/13/18 231 lb (104.8 kg)  11/17/18 232 lb 9.6 oz (105.5 kg)  04/21/18 226 lb 12.8 oz (102.9 kg)      Other studies  personally reviewed: Additional studies/ records that were reviewed today include: Dr Kennon Holter notes  Review of the above records today demonstrates: I do not see recent echo   ASSESSMENT AND PLAN:  1.  Persistent afib The patient has symptomatic, recurrent persistent atrial fibrillation. he is s/p prior ablation at [facility].  He has done reasonably well with tikosyn.  Bmet, mg are ordered today.  Needs bmet, mg every 21months Chads2vasc score is 1.  he is not on [facility] therapy as per guidelines. No changes are advised at this time.  Order an echo to evaluate LA size and exclude structural heart disease.   2. OSA Compliant with CPAP Followed by Dr Halford Chessman  3. ETOH Avoidance advised   Follow-up:  Return in a year Follow-up with Dr Gwenlyn Found as scheduled  Current medicines are reviewed at length with the patient today.   The patient does not have concerns regarding his medicines.  The following changes were made today:  none    Signed, Johnathan Grayer, MD  12/13/2018 4:15 PM     [place] [address] [phone] (office) [phone] (fax)

## 2018-12-14 LAB — BASIC METABOLIC PANEL
BUN/Creatinine Ratio: 21 (ref 10–24)
BUN: 21 mg/dL (ref 8–27)
CO2: 23 mmol/L (ref 20–29)
Calcium: 9.6 mg/dL (ref 8.6–10.2)
Chloride: 100 mmol/L (ref 96–106)
Creatinine, Ser: 0.98 mg/dL (ref 0.76–1.27)
GFR calc Af Amer: 95 mL/min/{1.73_m2} (ref >59)
GFR calc non Af Amer: 82 mL/min/{1.73_m2} (ref >59)
Glucose: 88 mg/dL (ref 65–99)
Potassium: 4.6 mmol/L (ref 3.5–5.2)
Sodium: 138 mmol/L (ref 134–144)

## 2018-12-14 LAB — MAGNESIUM: Magnesium: 1.8 mg/dL (ref 1.6–2.3)

## 2018-12-14 NOTE — Addendum Note (Signed)
Addended by: Rose Phi on: 12/14/2018 05:05 PM   Modules accepted: Orders

## 2018-12-15 ENCOUNTER — Ambulatory Visit: Payer: BLUE CROSS/BLUE SHIELD | Admitting: Family Medicine

## 2018-12-20 ENCOUNTER — Encounter: Payer: Self-pay | Admitting: Family Medicine

## 2018-12-20 ENCOUNTER — Ambulatory Visit: Payer: BLUE CROSS/BLUE SHIELD | Admitting: Family Medicine

## 2018-12-20 ENCOUNTER — Telehealth: Payer: Self-pay | Admitting: Family Medicine

## 2018-12-20 VITALS — BP 107/73 | HR 60 | Temp 98.1°F | Resp 16 | Ht 73.0 in | Wt 231.0 lb

## 2018-12-20 DIAGNOSIS — R351 Nocturia: Secondary | ICD-10-CM

## 2018-12-20 DIAGNOSIS — Z23 Encounter for immunization: Secondary | ICD-10-CM

## 2018-12-20 DIAGNOSIS — E7800 Pure hypercholesterolemia, unspecified: Secondary | ICD-10-CM

## 2018-12-20 DIAGNOSIS — N401 Enlarged prostate with lower urinary tract symptoms: Secondary | ICD-10-CM | POA: Diagnosis not present

## 2018-12-20 DIAGNOSIS — F419 Anxiety disorder, unspecified: Secondary | ICD-10-CM | POA: Diagnosis not present

## 2018-12-20 LAB — LIPID PANEL
Cholesterol: 168 mg/dL (ref 0–200)
HDL: 43.5 mg/dL (ref >39.00)
LDL Cholesterol: 100 mg/dL — ABNORMAL HIGH (ref 0–99)
NonHDL: 124.94
Total CHOL/HDL Ratio: 4
Triglycerides: 124 mg/dL (ref 0.0–149.0)
VLDL: 24.8 mg/dL (ref 0.0–40.0)

## 2018-12-20 MED ORDER — ESCITALOPRAM OXALATE 5 MG PO TABS: 5.0000 mg | ORAL_TABLET | Freq: Every day | ORAL | 1 refills | Status: DC

## 2018-12-20 MED ORDER — ATORVASTATIN CALCIUM 40 MG PO TABS: 40.0000 mg | ORAL_TABLET | Freq: Every day | ORAL | 3 refills | Status: DC

## 2018-12-20 NOTE — Progress Notes (Signed)
[name] , 1956/07/10, 62 y.o., male MRN: [id number] Patient Care Team    Relationship Specialty Notifications Start End  Ma Hillock, DO PCP - General Family Medicine  04/15/17   Lorretta Harp, MD Consulting Physician Cardiology  03/22/18   Silverio Decamp, MD Consulting Physician Family Medicine  03/22/18     Chief Complaint  Patient presents with  . Follow-up    on anxiety and depression     Subjective: Pt presents for an OV  Anxiety  Doing great on lexapro 5 mg qd.   Pure hypercholesterolemia/Morbid obesity (HCC)  Now taking his lipitor 40 mg QD daily.   BPH: new issue for him the last few months he has noticed increase in urinary frequency and urge, as well as nocturia x3. His urinary stream is unchanged. He is drinking more water after dinner for health reasons. He denies dysuria, hematuria or fever.  Last PSA 04/2018 normal.   Depression screen [facility] 2/9 12/20/2018 03/22/2018 08/07/2017 04/15/2017  Decreased Interest 0 0 0 0  Down, Depressed, Hopeless 0 0 0 0  PHQ - 2 Score 0 0 0 0  Altered sleeping 0 - 0 -  Tired, decreased energy 0 - 0 -  Change in appetite 0 - 0 -  Feeling bad or failure about yourself  0 - 0 -  Trouble concentrating 0 - 0 -  Moving slowly or fidgety/restless 0 - 0 -  Suicidal thoughts 0 - 0 -  PHQ-9 Score 0 - 0 -   GAD 7 : Generalized Anxiety Score 12/20/2018 08/07/2017  Nervous, Anxious, on Edge 0 0  Control/stop worrying 0 0  Worry too much - different things 0 0  Trouble relaxing 0 0  Restless 0 0  Easily annoyed or irritable 0 0  Afraid - awful might happen 0 0  Total GAD 7 Score 0 0     Allergies  Allergen Reactions  . Sulfa Antibiotics     Unknown reaction in childhood   Social History   Tobacco Use  . Smoking status: Never Smoker  . Smokeless tobacco: Former Systems developer    Types: Chew  Substance Use Topics  . Alcohol use: Yes    Alcohol/week: 10.0 standard drinks    Types: 10 Cans of beer per week   Past Medical  History:  Diagnosis Date  . Hyperlipidemia   . Hypertension    pt denies  . Persistent atrial fibrillation   . RLS (restless legs syndrome)   . Sleep apnea    CPAP   Past Surgical History:  Procedure Laterality Date  . ATRIAL FIBRILLATION ABLATION  2015  . FOOT SURGERY    . HERNIA REPAIR    . LUMBAR [place] SURGERY  2012   L3-L4, No hardware   Family History  Problem Relation Age of Onset  . Diabetes Mother   . Diabetes Brother   . Early death Brother   . Heart disease Brother    Allergies as of 12/20/2018      Reactions   Sulfa Antibiotics    Unknown reaction in childhood      Medication List       Accurate as of December 20, 2018 10:47 AM. Always use your most recent med list.        aspirin EC 81 MG tablet Take 81 mg by mouth daily.   atorvastatin 40 MG tablet Commonly known as:  LIPITOR Take 1 tablet (40 mg total) by mouth daily. Patient needs  follow up/CPE for further refills.   dofetilide 500 MCG capsule Commonly known as:  TIKOSYN Take 1 capsule (500 mcg total) by mouth 2 (two) times daily.   escitalopram 5 MG tablet Commonly known as:  LEXAPRO Take 1 tablet (5 mg total) by mouth daily. Needs office visit prior to any additional refills.       All past medical history, surgical history, allergies, family history, immunizations andmedications were updated in the EMR today and reviewed under the history and medication portions of their EMR.     ROS: Negative, with the exception of above mentioned in HPI   Objective:  BP 107/73 (BP Location: Left Arm, Patient Position: Sitting, Cuff Size: Large)   Pulse 60   Temp 98.1 F (36.7 C) (Oral)   Resp 16   Ht 6\' 1"  (1.854 m)   Wt 231 lb (104.8 kg)   SpO2 98%   BMI 30.48 kg/m  Body mass index is 30.48 kg/m. Gen: Afebrile. No acute distress. Nontoxic in appearance, well developed, well nourished.  HENT: AT. [place].  MMM Eyes:Pupils Equal Round Reactive to light, Extraocular movements intact,  Conjunctiva  without redness, discharge or icterus. CV: RRR no murmur, no edema Chest: CTAB, no wheeze or crackles. Good air movement, normal resp effort.  Neuro:  Normal gait. PERLA. EOMi. Alert. Oriented x3  Psych: Normal affect, dress and demeanor. Normal speech. Normal thought content and judgment.  No exam data present No results found. No results found for this or any previous visit (from the past 24 hour(s)).  Assessment/Plan: Jaedyn Lard is a 62 y.o. male present for OV for  Pure hypercholesterolemia - taking atorvastatin 40 mg QD - Lipid Profile  Anxiety - stable. Doing well. - refills provided today on lexapro 5 mg QD.  - f/u 6 mos.   BPH: - mild. With nocturia x3. Discussed options with him today, including medications start (flomax).  - He would like to first try the refraining from fluids after 6-7 pm, urinate before bed. If continues to bother him or worsens-- will follow up.   Flu shot provided today.     Reviewed expectations re: course of current medical issues.  Discussed self-management of symptoms.  Outlined signs and symptoms indicating need for more acute intervention.  Patient verbalized understanding and all questions were answered.  Patient received an After-Visit Summary.    Orders Placed This Encounter  Procedures  . Flu Vaccine QUAD 6+ mos PF IM (Fluarix Quad PF)  . Lipid Profile     Note is dictated utilizing voice recognition software. Although note has been proof read prior to signing, occasional typographical errors still can be missed. If any questions arise, please do not hesitate to call for verification.   electronically signed by:  Howard Pouch, DO  [place]

## 2018-12-20 NOTE — Telephone Encounter (Signed)
Please inform patient the following information: Cholesterol is normal. Looks good. Continue Lipitor.  LDL 149--> 100.

## 2018-12-20 NOTE — Patient Instructions (Signed)
We will call with labs once we get them.  I have refilled your meds.   Try to drink all fluids before 7 pm- urinate before bed. If this worsening- we can try a medicine call flomax.  Happy holidays!   Benign Prostatic Hyperplasia  Benign prostatic hyperplasia (BPH) is an enlarged prostate gland that is caused by the normal aging process and not by cancer. The prostate is a walnut-sized gland that is involved in the production of semen. It is located in front of the rectum and below the bladder. The bladder stores urine and the urethra is the tube that carries the urine out of the body. The prostate may get bigger as a man gets older. An enlarged prostate can press on the urethra. This can make it harder to pass urine. The build-up of urine in the bladder can cause infection. Back pressure and infection may progress to bladder damage and kidney (renal) failure. What are the causes? This condition is part of a normal aging process. However, not all men develop problems from this condition. If the prostate enlarges away from the urethra, urine flow will not be blocked. If it enlarges toward the urethra and compresses it, there will be problems passing urine. What increases the risk? This condition is more likely to develop in men over the age of 31 years. What are the signs or symptoms? Symptoms of this condition include:  Getting up often during the night to urinate.  Needing to urinate frequently during the day.  Difficulty starting urine flow.  Decrease in size and strength of your urine stream.  Leaking (dribbling) after urinating.  Inability to pass urine. This needs immediate treatment.  Inability to completely empty your bladder.  Pain when you pass urine. This is more common if there is also an infection.  Urinary tract infection (UTI). How is this diagnosed? This condition is diagnosed based on your medical history, a physical exam, and your symptoms. Tests will also be  done, such as:  A post-void bladder scan. This measures any amount of urine that may remain in your bladder after you finish urinating.  A digital rectal exam. In a rectal exam, your health care provider checks your prostate by putting a lubricated, gloved finger into your rectum to feel the back of your prostate gland. This exam detects the size of your gland and any abnormal lumps or growths.  An exam of your urine (urinalysis).  A prostate specific antigen (PSA) screening. This is a blood test used to screen for prostate cancer.  An ultrasound. This test uses sound waves to electronically produce a picture of your prostate gland. Your health care provider may refer you to a specialist in kidney and prostate diseases (urologist). How is this treated? Once symptoms begin, your health care provider will monitor your condition (active surveillance or watchful waiting). Treatment for this condition will depend on the severity of your condition. Treatment may include:  Observation and yearly exams. This may be the only treatment needed if your condition and symptoms are mild.  Medicines to relieve your symptoms, including: ? Medicines to shrink the prostate. ? Medicines to relax the muscle of the prostate.  Surgery in severe cases. Surgery may include: ? Prostatectomy. In this procedure, the prostate tissue is removed completely through an open incision or with a laparascope or robotics. ? Transurethral resection of the prostate (TURP). In this procedure, a tool is inserted through the opening at the tip of the penis (urethra). It is  used to cut away tissue of the inner core of the prostate. The pieces are removed through the same opening of the penis. This removes the blockage. ? Transurethral incision (TUIP). In this procedure, small cuts are made in the prostate. This lessens the prostate's pressure on the urethra. ? Transurethral microwave thermotherapy (TUMT). This procedure uses  microwaves to create heat. The heat destroys and removes a small amount of prostate tissue. ? Transurethral needle ablation (TUNA). This procedure uses radio frequencies to destroy and remove a small amount of prostate tissue. ? Interstitial laser coagulation ([place]). This procedure uses a laser to destroy and remove a small amount of prostate tissue. ? Transurethral electrovaporization (TUVP). This procedure uses electrodes to destroy and remove a small amount of prostate tissue. ? Prostatic urethral lift. This procedure inserts an implant to push the lobes of the prostate away from the urethra. Follow these instructions at home:  Take over-the-counter and prescription medicines only as told by your health care provider.  Monitor your symptoms for any changes. Contact your health care provider with any changes.  Avoid drinking large amounts of liquid before going to bed or out in public.  Avoid or reduce how much caffeine or alcohol you drink.  Give yourself time when you urinate.  Keep all follow-up visits as told by your health care provider. This is important. Contact a health care provider if:  You have unexplained back pain.  Your symptoms do not get better with treatment.  You develop side effects from the medicine you are taking.  Your urine becomes very dark or has a bad smell.  Your lower abdomen becomes distended and you have trouble passing your urine. Get help right away if:  You have a fever or chills.  You suddenly cannot urinate.  You feel lightheaded, or very dizzy, or you faint.  There are large amounts of blood or clots in the urine.  Your urinary problems become hard to manage.  You develop moderate to severe low back or flank pain. The flank is the side of your body between the ribs and the hip. These symptoms may represent a serious problem that is an emergency. Do not wait to see if the symptoms will go away. Get medical help right away. Call your local  emergency services (911 in the [place] Do not drive yourself to the hospital. Summary  Benign prostatic hyperplasia (BPH) is an enlarged prostate that is caused by the normal aging process and not by cancer.  An enlarged prostate can press on the urethra. This can make it hard to pass urine.  This condition is part of a normal aging process and is more likely to develop in men over the age of 35 years.  Get help right away if you suddenly cannot urinate. This information is not intended to replace advice given to you by your health care provider. Make sure you discuss any questions you have with your health care provider. Document Released: 12/15/2005 Document Revised: 01/19/2017 Document Reviewed: 01/19/2017 Elsevier Interactive Patient Education  2019 [employer].

## 2018-12-21 NOTE — Telephone Encounter (Signed)
Patient advised, voiced understanding.

## 2018-12-23 ENCOUNTER — Other Ambulatory Visit: Payer: Self-pay

## 2018-12-23 ENCOUNTER — Ambulatory Visit (HOSPITAL_COMMUNITY): Payer: BLUE CROSS/BLUE SHIELD | Attending: Cardiovascular Disease

## 2018-12-23 DIAGNOSIS — I4819 Other persistent atrial fibrillation: Secondary | ICD-10-CM

## 2018-12-24 ENCOUNTER — Telehealth: Payer: Self-pay | Admitting: Cardiovascular Disease

## 2018-12-24 ENCOUNTER — Telehealth: Payer: Self-pay | Admitting: Internal Medicine

## 2018-12-24 NOTE — Telephone Encounter (Signed)
The only medication he would need to avoid would be 1st generation antihistamines (Benadryl, Chlor-Trimeton) or combination cold products that contain these. 2nd generation antihistamines like Zyrtec or Claritin are fine to use, as are decongestants like Sudafed depending on his symptoms.

## 2018-12-24 NOTE — Telephone Encounter (Signed)
Spoke with pt, aware of the recommendations.  

## 2018-12-24 NOTE — Telephone Encounter (Signed)
New Message  Pt c/o medication issue:  1. Name of Medication: Tikosyn  2. How are you currently taking this medication (dosage and times per day)? Take 1 capsule (500 mcg total) by mouth 2 (two) times daily  3. Are you having a reaction (difficulty breathing--STAT)? No  4. What is your medication issue? Pt states he has a head cold and wants to know what he can take that will not interfere with this medication. Please call

## 2018-12-24 NOTE — Telephone Encounter (Signed)
New Message:     Pt wants to know which antibiotic can he take for his cold along with his other medicine.

## 2018-12-24 NOTE — Telephone Encounter (Signed)
Per discussion Pt ok to take antihistamines.  Advised to avoid decongestants (per DC) because they can cause afib.  Pt indicates understanding.  Will have his pharmacist help him identify an antihistamine for his symptoms.  No further needs.

## 2018-12-24 NOTE — Telephone Encounter (Signed)
Antibiotic selection will depend on type of infection. Regular cold  And most bronchitis will not require antibiotics because they are usually viral in nature.  Okay to take Mucinex for 3days, increase fluids, use nasa saline solution for congestion.  For bacterial sinus infection Augmentin or doxycycline will are good choices, but will require assessment prior to prescribing.  Sometimes steroid therapy may be needed for severe airway inflammation.  Urgent Care or PCP will be able to manage.

## 2018-12-30 DIAGNOSIS — J019 Acute sinusitis, unspecified: Secondary | ICD-10-CM | POA: Diagnosis not present

## 2019-01-04 ENCOUNTER — Encounter: Payer: Self-pay | Admitting: Internal Medicine

## 2019-01-04 ENCOUNTER — Ambulatory Visit: Payer: BLUE CROSS/BLUE SHIELD | Admitting: Internal Medicine

## 2019-01-04 VITALS — BP 128/76 | HR 82 | Ht 73.0 in | Wt 234.5 lb

## 2019-01-04 DIAGNOSIS — Z1211 Encounter for screening for malignant neoplasm of colon: Secondary | ICD-10-CM

## 2019-01-04 MED ORDER — PEG-KCL-NACL-NASULF-NA ASC-C 140 G PO SOLR: 1.0000 | Freq: Once | ORAL | 0 refills | Status: AC

## 2019-01-04 NOTE — Patient Instructions (Signed)

## 2019-01-04 NOTE — Progress Notes (Signed)
HISTORY OF PRESENT ILLNESS:  Johnathan [name] is a 63 y.o. 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 Raoul Pitch recommending screening colonoscopy.  Patient tells me that he underwent colonoscopy approximately 8 years ago in [place].  The results are uncertain.  He is not sure if he had polyps or when follow-up was recommended.  He was not able to obtain that record for review.  Patient's GI review of systems is negative.  In particular no rectal bleeding, change in bowel habits, unexplained weight loss, or abdominal pain.  Review of outside laboratories from March 2019 finds unremarkable comprehensive metabolic panel and CBC.  Hemoglobin 15.7.  Chemistries from December 2019 were normal.  No relevant x-rays in the x-ray file after review.  Patient denies a family history of colon cancer.  He has not had Hemoccult studies  REVIEW OF SYSTEMS:  All non-GI ROS negative unless otherwise stated in the HPI except for sleep apnea  Past Medical History:  Diagnosis Date  . Hyperlipidemia   . Hypertension    pt denies  . Persistent atrial fibrillation   . RLS (restless legs syndrome)   . Sleep apnea    CPAP    Past Surgical History:  Procedure Laterality Date  . ATRIAL FIBRILLATION ABLATION  2015  . FOOT SURGERY    . HERNIA REPAIR    . LUMBAR [place] SURGERY  2012   L3-L4, No hardware    Social History Johnathan [name]  reports that he has never smoked. He has quit using smokeless tobacco.  His smokeless tobacco use included chew. He reports current alcohol use of about 10.0 standard drinks of alcohol per week. He reports that he does not use drugs.  family history includes Diabetes in his brother and mother; Early death in his brother; Heart disease in his brother.  Allergies  Allergen Reactions  . Sulfa Antibiotics     Unknown reaction in  childhood       PHYSICAL EXAMINATION: Vital signs: BP 128/76   Pulse 82   Ht 6\' 1"  (1.854 m)   Wt 234 lb 8 oz (106.4 kg)   BMI 30.94 kg/m   Constitutional: generally well-appearing, no acute distress Psychiatric: alert and oriented x3, cooperative Eyes: extraocular movements intact, anicteric, conjunctiva pink Mouth: oral pharynx moist, no lesions Neck: supple no lymphadenopathy Cardiovascular: heart regular rate and rhythm, no murmur Lungs: clear to auscultation bilaterally Abdomen: soft, nontender, nondistended, no obvious ascites, no peritoneal signs, normal bowel sounds, no organomegaly Rectal: Deferred until colonoscopy Extremities: no clubbing, cyanosis, or lower extremity edema bilaterally Skin: no lesions on visible extremities Neuro: No focal deficits.  Cranial nerves intact  ASSESSMENT:  1.  Screening colonoscopy.  Baseline risk.  Appropriate candidate without contraindication 2.  History of atrial fibrillation status post ablation   PLAN:  1.  Screening colonoscopy.The nature of the procedure, as well as the risks, benefits, and alternatives were carefully and thoroughly reviewed with the patient. Ample time for discussion and questions allowed. The patient understood, was satisfied, and agreed to proceed.  A copy of the consultation note has been sent to Dr. Raoul Pitch

## 2019-01-12 ENCOUNTER — Encounter: Payer: Self-pay | Admitting: Internal Medicine

## 2019-01-18 ENCOUNTER — Ambulatory Visit (AMBULATORY_SURGERY_CENTER): Payer: BLUE CROSS/BLUE SHIELD | Admitting: Internal Medicine

## 2019-01-18 ENCOUNTER — Encounter: Payer: Self-pay | Admitting: Internal Medicine

## 2019-01-18 VITALS — BP 105/60 | HR 60 | Temp 97.1°F | Resp 16 | Ht 73.0 in | Wt 234.0 lb

## 2019-01-18 DIAGNOSIS — D129 Benign neoplasm of anus and anal canal: Secondary | ICD-10-CM

## 2019-01-18 DIAGNOSIS — Z1211 Encounter for screening for malignant neoplasm of colon: Secondary | ICD-10-CM | POA: Diagnosis not present

## 2019-01-18 DIAGNOSIS — D123 Benign neoplasm of transverse colon: Secondary | ICD-10-CM

## 2019-01-18 DIAGNOSIS — D128 Benign neoplasm of rectum: Secondary | ICD-10-CM

## 2019-01-18 DIAGNOSIS — D122 Benign neoplasm of ascending colon: Secondary | ICD-10-CM | POA: Diagnosis not present

## 2019-01-18 MED ORDER — SODIUM CHLORIDE 0.9 % IV SOLN: 500.0000 mL | Freq: Once | INTRAVENOUS | Status: DC

## 2019-01-18 NOTE — Progress Notes (Signed)
PT taken to PACU. Monitors in place. VSS. Report given to RN. 

## 2019-01-18 NOTE — Progress Notes (Signed)
Called to room to assist during endoscopic procedure.  Patient ID and intended procedure confirmed with present staff. Received instructions for my participation in the procedure from the performing physician.  

## 2019-01-18 NOTE — Patient Instructions (Signed)
Thank you for allowing [place] to care for you today!  Await pathology results by mail, approximately 2 weeks.  Recommendation for next colonoscopy will be made at that time.  Resume previous diet and medications today.  Return to normal activities tomorrow.    YOU HAD AN ENDOSCOPIC PROCEDURE TODAY AT [facility]:   Refer to the procedure report that was given to you for any specific questions about what was found during the examination.  If the procedure report does not answer your questions, please call your gastroenterologist to clarify.  If you requested that your care partner not be given the details of your procedure findings, then the procedure report has been included in a sealed envelope for you to review at your convenience later.  YOU SHOULD EXPECT: Some feelings of bloating in the abdomen. Passage of more gas than usual.  Walking can help get rid of the air that was put into your GI tract during the procedure and reduce the bloating. If you had a lower endoscopy (such as a colonoscopy or flexible sigmoidoscopy) you may notice spotting of blood in your stool or on the toilet paper. If you underwent a bowel prep for your procedure, you may not have a normal bowel movement for a few days.  Please Note:  You might notice some irritation and congestion in your nose or some drainage.  This is from the oxygen used during your procedure.  There is no need for concern and it should clear up in a day or so.  SYMPTOMS TO REPORT IMMEDIATELY:   Following lower endoscopy (colonoscopy or flexible sigmoidoscopy):  Excessive amounts of blood in the stool  Significant tenderness or worsening of abdominal pains  Swelling of the abdomen that is new, acute  Fever of 100F or higher   For urgent or emergent issues, a gastroenterologist can be reached at any hour by calling [phone].   DIET:  We do recommend a small meal at first, but then you may proceed to your regular diet.   Drink plenty of fluids but you should avoid alcoholic beverages for 24 hours.  ACTIVITY:  You should plan to take it easy for the rest of today and you should NOT DRIVE or use heavy machinery until tomorrow (because of the sedation medicines used during the test).    FOLLOW UP: Our staff will call the number listed on your records the next business day following your procedure to check on you and address any questions or concerns that you may have regarding the information given to you following your procedure. If we do not reach you, we will leave a message.  However, if you are feeling well and you are not experiencing any problems, there is no need to return our call.  We will assume that you have returned to your regular daily activities without incident.  If any biopsies were taken you will be contacted by phone or by letter within the next 1-3 weeks.  Please call us at [phone] if you have not heard about the biopsies in 3 weeks.    SIGNATURES/CONFIDENTIALITY: You and/or your care partner have signed paperwork which will be entered into your electronic medical record.  These signatures attest to the fact that that the information above on your After Visit Summary has been reviewed and is understood.  Full responsibility of the confidentiality of this discharge information lies with you and/or your care-partner.

## 2019-01-18 NOTE — Op Note (Signed)
[facility] Patient Name: Johnathan [name] Procedure Date: 01/18/2019 12:25 PM MRN: [id number] Endoscopist: Docia Chuck. Henrene Pastor , MD Age: 63 Referring MD:  Date of Birth: 1956-05-23 Gender: Male Account #: [id number] Procedure:                Colonoscopy with cold snare polypectomy x 3 Indications:              Screening for colorectal malignant neoplasm Medicines:                Monitored Anesthesia Care Procedure:                Pre-Anesthesia Assessment:                           - Prior to the procedure, a History and Physical                            was performed, and patient medications and                            allergies were reviewed. The patient's tolerance of                            previous anesthesia was also reviewed. The risks                            and benefits of the procedure and the sedation                            options and risks were discussed with the patient.                            All questions were answered, and informed consent                            was obtained. Prior Anticoagulants: The patient has                            taken no previous anticoagulant or antiplatelet                            agents. ASA Grade Assessment: II - A patient with                            mild systemic disease. After reviewing the risks                            and benefits, the patient was deemed in                            satisfactory condition to undergo the procedure.                           After obtaining informed consent, the colonoscope  was passed under direct vision. Throughout the                            procedure, the patient's blood pressure, pulse, and                            oxygen saturations were monitored continuously. The                            Colonoscope was introduced through the anus and                            advanced to the the cecum, identified by   appendiceal orifice and ileocecal valve. The                            ileocecal valve, appendiceal orifice, and rectum                            were photographed. The quality of the bowel                            preparation was excellent. The colonoscopy was                            performed without difficulty. The patient tolerated                            the procedure well. The bowel preparation used was                            SUPREP. Scope In: 12:34:50 PM Scope Out: 12:47:29 PM Scope Withdrawal Time: 0 hours 9 minutes 45 seconds  Total Procedure Duration: 0 hours 12 minutes 39 seconds  Findings:                 Three polyps were found in the rectum, transverse                            colon and ascending colon. The polyps were 2 to 5                            mm in size. These polyps were removed with a cold                            snare. Resection and retrieval were complete.                           Internal hemorrhoids were found during                            retroflexion. The hemorrhoids were small.                           The exam was otherwise without  abnormality on                            direct and retroflexion views. Complications:            No immediate complications. Estimated blood loss:                            None. Estimated Blood Loss:     Estimated blood loss: none. Impression:               - Three 2 to 5 mm polyps in the rectum, in the                            transverse colon and in the ascending colon,                            removed with a cold snare. Resected and retrieved.                           - Internal hemorrhoids.                           - The examination was otherwise normal on direct                            and retroflexion views. Recommendation:           - Repeat colonoscopy in 3 or 5 years for                            surveillance, pending final pathology.                           - Patient has a  contact number available for                            emergencies. The signs and symptoms of potential                            delayed complications were discussed with the                            patient. Return to normal activities tomorrow.                            Written discharge instructions were provided to the                            patient.                           - Resume previous diet.                           - Continue present medications.                           -  Await pathology results. Docia Chuck. Henrene Pastor, MD 01/18/2019 12:53:17 PM This report has been signed electronically.

## 2019-01-19 ENCOUNTER — Telehealth: Payer: Self-pay

## 2019-01-19 NOTE — Telephone Encounter (Signed)
  Follow up Call-  Call back number 01/18/2019  Post procedure Call Back phone  # [phone]  Permission to leave phone message Yes  Some recent data might be hidden     Patient questions:  Do you have a fever, pain , or abdominal swelling? No. Pain Score  0 *  Have you tolerated food without any problems? Yes.    Have you been able to return to your normal activities? Yes.    Do you have any questions about your discharge instructions: Diet   No. Medications  No. Follow up visit  No.  Do you have questions or concerns about your Care? No.  Actions: * If pain score is 4 or above: No action needed, pain <4.

## 2019-01-20 ENCOUNTER — Encounter: Payer: Self-pay | Admitting: Internal Medicine

## 2019-03-16 ENCOUNTER — Ambulatory Visit: Payer: Self-pay | Admitting: *Deleted

## 2019-03-16 NOTE — Telephone Encounter (Signed)
Noted  

## 2019-03-16 NOTE — Telephone Encounter (Signed)
Message from Jodie Echevaria sent at 03/16/2019 1:45 PM EDT   Patient called to say that he was with his 63 year old son in [place] who is very sick and was tested for the flu which was negative. Per patient he and his wife are on self quarantine at this moment has questions as to what is the next step. Ph# [phone]    Returned call to patient asking for advice regarding possibly getting Covid-19.  Pt explained that he visited his on in [place] and stated that son had gotten sick while they were there. He was sneezing, coughing. Develop a headache, head cold, aches and pain Saturday night. Sunday night, he had a high fever and head congestion. He was treated for a sinus infection. Tested negative for the flu and strep. Dad and mom came back home on Saturday and wanted to make sure that they had not gotten any thing from their son. They both are having no symptoms. Have only traveled to [place]. Advised to avoid crowds, practice social distancing, drink plenty of fluids, get rest, fresh air, good hand washing, keeping hands away from their faces. Call the office for these symptoms: shortness of breath, chest pain, fever, cough or any other concerns. Recommend E-Visit if necessary Call 911 for respiratory distress. Pt voiced understanding. Routing to flow at [facility].

## 2019-03-17 ENCOUNTER — Telehealth: Payer: Self-pay | Admitting: Cardiovascular Disease

## 2019-03-17 NOTE — Telephone Encounter (Signed)
Pt states he is calling as a precautionary, and was wondering what medication could he take to prevent getting the virus or take if he does catch it. Pt instructed to contact PCP office. Pt voiced understanding.

## 2019-03-17 NOTE — Telephone Encounter (Signed)
New message   Patient states that he is self quarantining because he has been around his son who is traveling. Patient wants to know what type medications he would take with his condition if he were to get the virus? Please call to discuss.

## 2019-03-22 ENCOUNTER — Telehealth: Payer: Self-pay | Admitting: Family Medicine

## 2019-03-22 NOTE — Telephone Encounter (Signed)
Copied from [place] [id number]. Topic: Quick Communication - See Telephone Encounter >> Mar 22, 2019  4:05 PM Rutherford Nail, NT wrote: CRM for notification. See Telephone encounter for: 03/22/19. Patient calling and states that he is having some right ear trouble. States that his right ear seems to be "plugged up, like it might be the start of an ear infection." Would like to know what Dr Raoul Pitch recommends, whether it be a visit in the office/web or if she could send a medication into the pharmacy. Please advise. Would like a call back.

## 2019-03-23 NOTE — Telephone Encounter (Signed)
Pt was called and given information and told to call back if it got worse or did not get better after recommended tx. Pt verbalized understanding

## 2019-03-23 NOTE — Telephone Encounter (Signed)
Over the counter ear wax removal solution kit (ex: debrox) per label instructions. If ear wax gets pushed back by ear buds it can cause these symptoms.

## 2019-03-23 NOTE — Telephone Encounter (Signed)
Pt complains of right ear being clogged and hard to hear, denies pain. Denies sinus drainage, sore throat, cough or fever.  Pt woke up fine yesterday and then by the afternoon it was clogged. He states it is not as clogged today. Pt wears ears plug daily for work and sometimes falls asleep at night with ear buds in.  Pt stated if he had to come in then he was going to wait to see if it got better due to COVID 19. Pt wanted advice on things to do at home but did agree to do phone visit.   Please advise.

## 2019-03-24 ENCOUNTER — Encounter: Payer: BLUE CROSS/BLUE SHIELD | Admitting: Family Medicine

## 2019-03-29 ENCOUNTER — Encounter: Payer: Self-pay | Admitting: Family Medicine

## 2019-03-29 ENCOUNTER — Other Ambulatory Visit: Payer: Self-pay

## 2019-03-29 ENCOUNTER — Ambulatory Visit (INDEPENDENT_AMBULATORY_CARE_PROVIDER_SITE_OTHER): Payer: BLUE CROSS/BLUE SHIELD | Admitting: Family Medicine

## 2019-03-29 DIAGNOSIS — H9311 Tinnitus, right ear: Secondary | ICD-10-CM | POA: Insufficient documentation

## 2019-03-29 DIAGNOSIS — H6121 Impacted cerumen, right ear: Secondary | ICD-10-CM | POA: Insufficient documentation

## 2019-03-29 MED ORDER — CARBAMIDE PEROXIDE 6.5 % OT SOLN: 5.0000 [drp] | Freq: Two times a day (BID) | OTIC | 0 refills | Status: DC

## 2019-03-29 NOTE — Progress Notes (Signed)
Chief Complaint  Patient presents with  . Ear Problem    Pt is stating his ears feel full. Pt used ear buds and he slept with them in all night. Pt has gotten ear wax cleaner and it helped some but now has ringing in ears.    Interactive audio and video telecommunications were attempted between this provider and patient, however failed, due to patient having technical difficulties OR patient did not have access to video capability. We continued and completed visit with audio only.   Virtual Visit via Telephone Note  I connected with [name] on 03/29/19 at  8:45 AM EDT by telephone and verified that I am speaking with the correct person using two identifiers.   I discussed the limitations, risks, security and privacy concerns of performing an evaluation and management service by telephone and the availability of in person appointments. I also discussed with the patient that there may be a patient responsible charge related to this service. The patient expressed understanding and agreed to proceed.   History of Present Illness: Pt reports right ear fullness of 2 weeks duration. He uses a ear bud in that ear frequently. He did pick up an OTC  Cerumen kit and used once. He endorses it did improve a small fraction after use. Can clear the ear sometimes by blowing nose. He does have some humming/ringing on occasion after using the kit. The ringing did resolve. Hearing is unaffected. He has no pain or drainage from the ear. He denies fever, cough, sore throat or sinus congestion.    Observations/Objective: Gen: No acute distress. No cough or sinus congestion .  Neuro: Alert. Oriented x3   Assessment and Plan: Cerumen debris on tympanic membrane of right ear/tinnitus-ringing in ear - New problem.  - No concerning features by history. Ringing resolved after a day or so.  - He had only used the solution once. Ear remains full.  - debrox solution BID for 7-10  Days to right ear (prescribed) - If  still present in 7-10 days follow up, sooner if pain or fever.   Follow Up Instructions: f/u 8-10 days if still an issue, can be phone if requesting ENT referral or face to face with this provider if wanting to try lavage in the office.     I discussed the assessment and treatment plan with the patient. The patient was provided an opportunity to ask questions and all were answered. The patient agreed with the plan and demonstrated an understanding of the instructions.   The patient was advised to call back or seek an in-person evaluation if the symptoms worsen or if the condition fails to improve as anticipated.    Howard Pouch, DO

## 2019-03-29 NOTE — Patient Instructions (Signed)
Telehealth

## 2019-06-01 ENCOUNTER — Encounter: Payer: BLUE CROSS/BLUE SHIELD | Admitting: Family Medicine

## 2019-06-09 ENCOUNTER — Other Ambulatory Visit: Payer: Self-pay | Admitting: Family Medicine

## 2019-06-09 DIAGNOSIS — F419 Anxiety disorder, unspecified: Secondary | ICD-10-CM

## 2019-06-17 ENCOUNTER — Telehealth: Payer: Self-pay | Admitting: Cardiovascular Disease

## 2019-06-17 NOTE — Telephone Encounter (Signed)
  Patient is being given prednisone for an issue with his ear. He would like to know if it is okay for him to take the prednisone with tikosyn. If not, what can he take?

## 2019-06-17 NOTE — Telephone Encounter (Signed)
Short term prednisone should be fine.  Preference if topical ear drops

## 2019-06-17 NOTE — Telephone Encounter (Signed)
Pt advised and will call if he has any other questions or problems.

## 2019-06-17 NOTE — Telephone Encounter (Signed)
Did not find interaction but will check with the [facility] to be sure.

## 2019-07-08 ENCOUNTER — Other Ambulatory Visit: Payer: Self-pay | Admitting: General Practice

## 2019-07-08 DIAGNOSIS — H918X1 Other specified hearing loss, right ear: Secondary | ICD-10-CM

## 2019-07-08 DIAGNOSIS — H93291 Other abnormal auditory perceptions, right ear: Secondary | ICD-10-CM

## 2019-07-08 DIAGNOSIS — IMO0001 Reserved for inherently not codable concepts without codable children: Secondary | ICD-10-CM

## 2019-07-19 ENCOUNTER — Other Ambulatory Visit: Payer: Self-pay

## 2019-07-19 ENCOUNTER — Encounter: Payer: Self-pay | Admitting: Family Medicine

## 2019-07-19 ENCOUNTER — Ambulatory Visit (INDEPENDENT_AMBULATORY_CARE_PROVIDER_SITE_OTHER): Payer: BC Managed Care – PPO | Admitting: Family Medicine

## 2019-07-19 VITALS — Ht 73.0 in | Wt 234.0 lb

## 2019-07-19 DIAGNOSIS — F419 Anxiety disorder, unspecified: Secondary | ICD-10-CM | POA: Diagnosis not present

## 2019-07-19 DIAGNOSIS — Z125 Encounter for screening for malignant neoplasm of prostate: Secondary | ICD-10-CM

## 2019-07-19 DIAGNOSIS — H9311 Tinnitus, right ear: Secondary | ICD-10-CM

## 2019-07-19 DIAGNOSIS — Z0000 Encounter for general adult medical examination without abnormal findings: Secondary | ICD-10-CM

## 2019-07-19 DIAGNOSIS — E7800 Pure hypercholesterolemia, unspecified: Secondary | ICD-10-CM | POA: Diagnosis not present

## 2019-07-19 DIAGNOSIS — Z131 Encounter for screening for diabetes mellitus: Secondary | ICD-10-CM

## 2019-07-19 DIAGNOSIS — G4733 Obstructive sleep apnea (adult) (pediatric): Secondary | ICD-10-CM | POA: Diagnosis not present

## 2019-07-19 DIAGNOSIS — E669 Obesity, unspecified: Secondary | ICD-10-CM

## 2019-07-19 DIAGNOSIS — I4821 Permanent atrial fibrillation: Secondary | ICD-10-CM

## 2019-07-19 DIAGNOSIS — Z9989 Dependence on other enabling machines and devices: Secondary | ICD-10-CM

## 2019-07-19 MED ORDER — ATORVASTATIN CALCIUM 40 MG PO TABS: 40.0000 mg | ORAL_TABLET | Freq: Every day | ORAL | 3 refills | Status: DC

## 2019-07-19 MED ORDER — ESCITALOPRAM OXALATE 5 MG PO TABS: 5.0000 mg | ORAL_TABLET | Freq: Every day | ORAL | 1 refills | Status: DC

## 2019-07-19 NOTE — Progress Notes (Signed)
I have discussed the procedure for the virtual visit with the patient who has given consent to proceed with assessment and treatment.   BETHANY DILLARD, CMA     

## 2019-07-19 NOTE — Patient Instructions (Signed)

## 2019-07-19 NOTE — Progress Notes (Signed)
VIRTUAL VISIT VIA VIDEO  I connected with Johnathan [name] on 07/19/19 at  3:00 PM EDT by a video enabled telemedicine application and verified that I am speaking with the correct person using two identifiers. Location patient: Home Location provider: [facility], Office Persons participating in the virtual visit: Patient, Dr. Raoul Pitch and R.Baker, LPN  I discussed the limitations of evaluation and management by telemedicine and the availability of in person appointments. The patient expressed understanding and agreed to proceed.    Patient ID: Johnathan [name], male  DOB: 09-Feb-1956, 63 y.o.   MRN: [id number] Patient Care Team    Relationship Specialty Notifications Start End  Ma Hillock, DO PCP - General Family Medicine  04/15/17   Lorretta Harp, MD Consulting Physician Cardiology  03/22/18   Silverio Decamp, MD Consulting Physician Family Medicine  03/22/18   Irene Shipper, MD Consulting Physician Gastroenterology  07/19/19     Chief Complaint  Patient presents with  . Annual Exam    Pt states that things are good overall. He has been having an issue with his right ear but he is seeing ENT.   No other complaints/issues.     Subjective:  Johnathan [name] is a 63 y.o.  male present for new patient establishment. All past medical history, surgical history, allergies, family history, immunizations, medications and social history were updated in the electronic medical record today. All recent labs, ED visits and hospitalizations within the last year were reviewed.  Health maintenance:  Colonoscopy: last screen 01/18/2019, recommend follow up 3 yr; Immunizations:  tdap UTD3/2019, influenza encouragedyearly. agreeable to shingrix>> will set up for nurse visit.  Infectious disease screening: HIV and Hep C completed PSA: low risk- desires yearly PSA Lab Results  Component Value Date   PSA 1.51 03/22/2018   PSA 1.0 04/16/2015  Assistive device: noen Oxygen YDX:AJOI Patient  has a Dental home. Hospitalizations/ED visits:reviewed  Depression screen [facility] 2/9 07/19/2019 12/20/2018 03/22/2018 08/07/2017 04/15/2017  Decreased Interest 0 0 0 0 0  Down, Depressed, Hopeless 0 0 0 0 0  PHQ - 2 Score 0 0 0 0 0  Altered sleeping 0 0 - 0 -  Tired, decreased energy 0 0 - 0 -  Change in appetite 0 0 - 0 -  Feeling bad or failure about yourself  0 0 - 0 -  Trouble concentrating 0 0 - 0 -  Moving slowly or fidgety/restless 0 0 - 0 -  Suicidal thoughts 0 0 - 0 -  PHQ-9 Score 0 0 - 0 -   GAD 7 : Generalized Anxiety Score 07/19/2019 12/20/2018 08/07/2017  Nervous, Anxious, on Edge 0 0 0  Control/stop worrying 0 0 0  Worry too much - different things 0 0 0  Trouble relaxing 0 0 0  Restless 0 0 0  Easily annoyed or irritable 0 0 0  Afraid - awful might happen 0 0 0  Total GAD 7 Score 0 0 0      Current Exercise Habits: The patient does not participate in regular exercise at present   Fall Risk  07/19/2019 03/22/2018 04/15/2017  Falls in the past year? 0 No No  Follow up Falls evaluation completed - -     Immunization History  Administered Date(s) Administered  . Influenza Split 10/21/2017  . Influenza,inj,Quad PF,6+ Mos 12/20/2018  . Tdap 03/22/2018    No exam data present  Past Medical History:  Diagnosis Date  . Arthritis  hands and feet  . Hyperlipidemia   . Hypertension    pt denies  . Persistent atrial fibrillation   . RLS (restless legs syndrome)   . Sleep apnea    CPAP   Allergies  Allergen Reactions  . Sulfa Antibiotics     Unknown reaction in childhood   Past Surgical History:  Procedure Laterality Date  . ATRIAL FIBRILLATION ABLATION  2015  . COLONOSCOPY    . FOOT SURGERY    . HERNIA REPAIR    . LUMBAR [place] SURGERY  2012   L3-L4, No hardware   Family History  Problem Relation Age of Onset  . Diabetes Mother   . Diabetes Brother   . Early death Brother   . Heart disease Brother   . Colon cancer Neg Hx   . Esophageal cancer Neg  Hx   . Rectal cancer Neg Hx   . Stomach cancer Neg Hx    Social History   Social History Narrative   Married to [place]. 2 children [place].    BS degree.    Wear seatbelt. Wears a bicycle helmet. Smoke detector in the home.   Firearms in the home.   Feels safe in  Relationships.   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 If you have any questions, ask your nurse or doctor.        STOP taking these medications   carbamide peroxide 6.5 % OTIC solution Commonly known as: DEBROX Stopped by: Howard Pouch, DO     TAKE these medications   atorvastatin 40 MG tablet Commonly known as: LIPITOR Take 1 tablet (40 mg total) by mouth daily. Patient needs follow up/CPE for further refills.   dofetilide 500 MCG capsule Commonly known as: TIKOSYN Take 1 capsule (500 mcg total) by mouth 2 (two) times daily.   escitalopram 5 MG tablet Commonly known as: LEXAPRO Take 1 tablet (5 mg total) by mouth daily. Needs office visit prior to any additional refills.       All past medical history, surgical history, allergies, family history, immunizations andmedications were updated in the EMR today and reviewed under the history and medication portions of their EMR.    No results found for this or any previous visit (from the past 2160 hour(s)).  No results found.   ROS: 14 pt review of systems performed and negative (unless mentioned in an HPI)  Objective: Ht 6\' 1"  (1.854 m)   Wt 234 lb (106.1 kg)   BMI 30.87 kg/m  Gen: Afebrile. No acute distress. Nontoxic in appearance, well-developed, well-nourished, obese male.  HENT: AT. [place]. MMM Eyes:Pupils Equal Round Reactive to light, Extraocular movements intact,  Conjunctiva without  redness, discharge or icterus. CV: no edema Chest: no cough or shortness of breath Skin: no rashes, purpura or petechiae. . Skin intact. Neuro/Msk:  Normal gait. PERLA. EOMi. Alert. Oriented x3.   Psych: Normal affect, dress and demeanor. Normal speech. Normal thought content and judgment.   Assessment/plan: Johnathan [name] is a 62 y.o. male present for cpe Encounter for preventive health examination Patient was encouraged to exercise greater than 150 minutes a week.  Patient was encouraged to choose a diet filled with fresh fruits and vegetables, and lean meats. AVS provided to patient today for education/recommendation on gender specific health and safety maintenance. Colonoscopy: last screen 01/18/2019, recommend follow up 3 yr; Immunizations:  tdap UTD3/2019, influenza encouragedyearly. agreeable to shingrix>> will set up for nurse visit.  Infectious disease screening: HIV and Hep C completed PSA: low risk- desires yearly PSA  Permanent atrial fibrillation (HCC)/Pure hypercholesterolemia/obesity  Stable- prescribed Tikosyn by cardiology Established w/ cards Dr. Gwenlyn Found. Continue routine follow ups. - start daily asa 81.  He has not been taking lipitor > 7  Months. Refills provided today.  Diet and exercise modifications discussed.  - Lipid panel, CBC, CMP, TSH - atorvastatin (LIPITOR) 40 MG tablet; Take 1 tablet (40 mg total) by mouth daily. Patient needs follow up/CPE for further refills.  Dispense: 90 tablet; Refill: 3 Anxiety - stable. Continue lexapro 5 mg QD - TSH - escitalopram (LEXAPRO) 5 MG tablet; Take 1 tablet (5 mg total) by mouth daily.  Dispense: 90 tablet; Refill: 1 - f/u 6 mos OSA on CPAP - follows with  Pulmonology Diabetes mellitus screening - Hemoglobin A1c Prostate cancer screening - PSA Tinnitus aurium, right Being managed by ENT   Return in about 1 year (around 07/18/2020) for CPE (30 min). 6 mos [place] Within 1 week for lab appt and nurse visit for shingrix  #1(2nd nurse visit in 3 mos for shingrix #2)  Note is dictated utilizing voice recognition software. Although note has been proof read prior to signing, occasional typographical errors still can be missed. If any questions arise, please do not hesitate to call for verification.  Electronically signed by: Howard Pouch, DO [place]

## 2019-07-26 ENCOUNTER — Other Ambulatory Visit: Payer: Self-pay

## 2019-07-26 ENCOUNTER — Ambulatory Visit (INDEPENDENT_AMBULATORY_CARE_PROVIDER_SITE_OTHER): Payer: BC Managed Care – PPO | Admitting: Family Medicine

## 2019-07-26 DIAGNOSIS — I4821 Permanent atrial fibrillation: Secondary | ICD-10-CM | POA: Diagnosis not present

## 2019-07-26 DIAGNOSIS — Z131 Encounter for screening for diabetes mellitus: Secondary | ICD-10-CM

## 2019-07-26 DIAGNOSIS — F419 Anxiety disorder, unspecified: Secondary | ICD-10-CM

## 2019-07-26 DIAGNOSIS — Z125 Encounter for screening for malignant neoplasm of prostate: Secondary | ICD-10-CM | POA: Diagnosis not present

## 2019-07-26 DIAGNOSIS — Z23 Encounter for immunization: Secondary | ICD-10-CM

## 2019-07-26 DIAGNOSIS — E7800 Pure hypercholesterolemia, unspecified: Secondary | ICD-10-CM

## 2019-07-26 LAB — CBC AND DIFFERENTIAL
Neutrophils Absolute: 4473
Neutrophils Absolute: 64
WBC: 7

## 2019-07-26 LAB — BASIC METABOLIC PANEL
BUN: 15 (ref 4–21)
Sodium: 140 (ref 137–147)

## 2019-07-26 LAB — HEPATIC FUNCTION PANEL: Alkaline Phosphatase: 80 (ref 25–125)

## 2019-07-26 LAB — LIPID PANEL: LDL Cholesterol: 112

## 2019-07-26 NOTE — Progress Notes (Signed)
Patient presented today for labs and 1st Shingrix as requested in last OV note dated 07/19/2019, tolerated well.

## 2019-07-27 ENCOUNTER — Telehealth: Payer: Self-pay | Admitting: Family Medicine

## 2019-07-27 ENCOUNTER — Encounter: Payer: Self-pay | Admitting: Family Medicine

## 2019-07-27 DIAGNOSIS — R17 Unspecified jaundice: Secondary | ICD-10-CM | POA: Insufficient documentation

## 2019-07-27 LAB — COMPREHENSIVE METABOLIC PANEL
AG Ratio: 1.7 (calc) (ref 1.0–2.5)
ALT: 17 U/L (ref 9–46)
AST: 22 U/L (ref 10–35)
Albumin: 4.2 g/dL (ref 3.6–5.1)
Alkaline phosphatase (APISO): 80 U/L (ref 35–144)
BUN: 15 mg/dL (ref 7–25)
CO2: 24 mmol/L (ref 20–32)
Calcium: 9.3 mg/dL (ref 8.6–10.3)
Chloride: 104 mmol/L (ref 98–110)
Creat: 0.95 mg/dL (ref 0.70–1.25)
Globulin: 2.5 g/dL (calc) (ref 1.9–3.7)
Glucose, Bld: 97 mg/dL (ref 65–99)
Potassium: 4.4 mmol/L (ref 3.5–5.3)
Sodium: 140 mmol/L (ref 135–146)
Total Bilirubin: 1.8 mg/dL — ABNORMAL HIGH (ref 0.2–1.2)
Total Protein: 6.7 g/dL (ref 6.1–8.1)

## 2019-07-27 LAB — LIPID PANEL
Cholesterol: 179 mg/dL (ref <200)
HDL: 48 mg/dL (ref >=40)
LDL Cholesterol (Calc): 112 mg/dL (calc) — ABNORMAL HIGH
Non-HDL Cholesterol (Calc): 131 mg/dL (calc) — ABNORMAL HIGH (ref <130)
Total CHOL/HDL Ratio: 3.7 (calc) (ref <5.0)
Triglycerides: 90 mg/dL (ref <150)

## 2019-07-27 LAB — CBC WITH DIFFERENTIAL/PLATELET
Absolute Monocytes: 595 cells/uL (ref 200–950)
Basophils Absolute: 28 cells/uL (ref 0–200)
Basophils Relative: 0.4 %
Eosinophils Absolute: 182 cells/uL (ref 15–500)
Eosinophils Relative: 2.6 %
HCT: 47.4 % (ref 38.5–50.0)
Hemoglobin: 16.1 g/dL (ref 13.2–17.1)
Lymphs Abs: 1722 cells/uL (ref 850–3900)
MCH: 30.7 pg (ref 27.0–33.0)
MCHC: 34 g/dL (ref 32.0–36.0)
MCV: 90.5 fL (ref 80.0–100.0)
MPV: 10.7 fL (ref 7.5–12.5)
Monocytes Relative: 8.5 %
Neutro Abs: 4473 cells/uL (ref 1500–7800)
Neutrophils Relative %: 63.9 %
Platelets: 266 10*3/uL (ref 140–400)
RBC: 5.24 10*6/uL (ref 4.20–5.80)
RDW: 13.3 % (ref 11.0–15.0)
Total Lymphocyte: 24.6 %
WBC: 7 10*3/uL (ref 3.8–10.8)

## 2019-07-27 LAB — HEMOGLOBIN A1C
Hgb A1c MFr Bld: 5.8 % of total Hgb — ABNORMAL HIGH (ref <5.7)
Mean Plasma Glucose: 120 (calc)
eAG (mmol/L): 6.6 (calc)

## 2019-07-27 LAB — PSA: PSA: 1.1 ng/mL (ref <=4.0)

## 2019-07-27 LAB — TSH: TSH: 2.71 mIU/L (ref 0.40–4.50)

## 2019-07-27 NOTE — Telephone Encounter (Signed)
Pt was called and given lab results. Lab visit scheduled and pt verbalized understanding

## 2019-07-27 NOTE — Telephone Encounter (Signed)
Please inform patient the following information: His labs are normal with the exception of mildly elevated bilirubin (which is secreted by the liver). This can be a common finding in patients that have fasted, but do need to repeat level in 4 weeks (AND he needs to make sure TO EAT AND DRINK before that lab). If abnormal on repeat then would need to discuss via appt. If normal on repeat no need to follow up with provider.   Lab ordered.

## 2019-07-28 ENCOUNTER — Ambulatory Visit
Admission: RE | Admit: 2019-07-28 | Discharge: 2019-07-28 | Disposition: A | Discharge status: Home / Self Care | Payer: BC Managed Care – PPO | Source: Ambulatory Visit | Attending: General Practice | Admitting: General Practice

## 2019-07-28 ENCOUNTER — Other Ambulatory Visit: Payer: Self-pay

## 2019-07-28 DIAGNOSIS — H93291 Other abnormal auditory perceptions, right ear: Secondary | ICD-10-CM

## 2019-07-28 DIAGNOSIS — H918X1 Other specified hearing loss, right ear: Secondary | ICD-10-CM

## 2019-07-28 DIAGNOSIS — IMO0001 Reserved for inherently not codable concepts without codable children: Secondary | ICD-10-CM

## 2019-07-28 MED ORDER — GADOBENATE DIMEGLUMINE 529 MG/ML IV SOLN
20.0000 mL | Freq: Once | INTRAVENOUS | Status: AC | PRN
  Administered 2019-07-28: 20 mL via INTRAVENOUS

## 2019-08-09 ENCOUNTER — Telehealth: Payer: Self-pay

## 2019-08-09 NOTE — Telephone Encounter (Signed)
ERROR

## 2019-08-09 NOTE — Telephone Encounter (Signed)
Copied from [place] [id number]. Topic: Referral - Request for Referral >> Aug 08, 2019  3:41 PM Alanda Slim E wrote: Has patient seen PCP for this complaint? Yes  *If NO, is insurance requiring patient see PCP for this issue before PCP can refer them? Referral for which specialty: ENT Preferred provider/office: anyone Dr. Raoul Pitch prefers  Reason for referral: Right ear has lost 3/4 hearing / Pt is advised to get a hearing aid/ Pt states this is an urgent matter and needs referral asap  Pt has been to ENT in [place] where he works and they ordered MRI. ENT told pt this was normal and he needed hearing aid. Pt states he does not believe that and would like a second opinion.

## 2019-08-11 ENCOUNTER — Telehealth: Payer: Self-pay | Admitting: Family Medicine

## 2019-08-11 DIAGNOSIS — H9191 Unspecified hearing loss, right ear: Secondary | ICD-10-CM

## 2019-08-11 DIAGNOSIS — H9311 Tinnitus, right ear: Secondary | ICD-10-CM

## 2019-08-11 NOTE — Telephone Encounter (Signed)
Patient walked in office. He had been hold for 17 minutes trying to get thru to [place]. He stated  he called 2 days ago and has not heard from anyone. Patient is very upset. He is requesting a referral for a second opinion for the loss of hearing in his right ear. I apologized to patient and told him that Dr. Raoul Pitch has been out of the office. He stated someone should have at least called him and let him know that.  He is very worried. He has been told he is losing his hearing.    Copied from phone note 8/11:  Reason for referral: Right ear has lost 3/4 hearing / Pt is advised to get a hearing aid/ Pt states this is an urgent matter and needs referral asap  Pt has been to ENT in [place] where he works and they ordered MRI. ENT told pt this was normal and he needed hearing aid. Pt states he does not believe that and would like a second opinion.   Please call patient as soon as possible.  [phone] on Friday morning.

## 2019-08-12 NOTE — Addendum Note (Signed)
Addended by: Howard Pouch A on: 08/12/2019 12:15 PM   Modules accepted: Orders

## 2019-08-12 NOTE — Telephone Encounter (Addendum)
-   Today is the first I have heard of his request.  - the hold for calls is extremely unfortunate and we are working on taking our own calls back very soon.  - He was seen for a virtual CPE 07/19/2019, in which he was already being managed by ENT- which I did not refer him on that occasion- uncertain why he needs me to place a second opinion referral urgently since I did not place the first referral?  - I am willing to place one for him, but I need to know if he has someone in mind and/or what he is expecting in terms of care since I do not have any records to know- such as>> does he want an audiologist/hearing test and consider hearing aids or new ENT to restart the process of evaluation and diagnosis?    - which ever he decides- I would advise him to get his medical records from the prior ENT/studies to provide to whomever he has a second opinion with.

## 2019-08-12 NOTE — Telephone Encounter (Signed)
Please advise if referral can be placed or if he needs a visit  Thanks

## 2019-08-12 NOTE — Telephone Encounter (Signed)
Spoke with patient regarding referral request.  Patient states he does have an appointment for hearing aids however he would like a referral for new ENT. He would like to have a second opinion and determine if there are surgical options.  Patient requesting referral to Dr. Radene Journey [phone]). Advised to obtain records from current ENT for appointment. Patient verbalized understanding.

## 2019-08-23 DIAGNOSIS — H9311 Tinnitus, right ear: Secondary | ICD-10-CM | POA: Diagnosis not present

## 2019-08-23 DIAGNOSIS — H903 Sensorineural hearing loss, bilateral: Secondary | ICD-10-CM | POA: Diagnosis not present

## 2019-08-23 DIAGNOSIS — H9123 Sudden idiopathic hearing loss, bilateral: Secondary | ICD-10-CM | POA: Diagnosis not present

## 2019-08-25 ENCOUNTER — Ambulatory Visit: Payer: BC Managed Care – PPO

## 2019-08-26 ENCOUNTER — Telehealth: Payer: Self-pay | Admitting: Family Medicine

## 2019-08-26 ENCOUNTER — Encounter: Payer: Self-pay | Admitting: Family Medicine

## 2019-08-26 ENCOUNTER — Other Ambulatory Visit: Payer: Self-pay

## 2019-08-26 ENCOUNTER — Ambulatory Visit (INDEPENDENT_AMBULATORY_CARE_PROVIDER_SITE_OTHER): Payer: BC Managed Care – PPO

## 2019-08-26 ENCOUNTER — Ambulatory Visit: Payer: BC Managed Care – PPO | Admitting: Family Medicine

## 2019-08-26 ENCOUNTER — Ambulatory Visit (INDEPENDENT_AMBULATORY_CARE_PROVIDER_SITE_OTHER): Payer: BC Managed Care – PPO | Admitting: Family Medicine

## 2019-08-26 VITALS — BP 128/77 | HR 68 | Temp 98.0°F | Resp 17 | Ht 73.0 in | Wt 228.5 lb

## 2019-08-26 DIAGNOSIS — M546 Pain in thoracic spine: Secondary | ICD-10-CM

## 2019-08-26 DIAGNOSIS — Z23 Encounter for immunization: Secondary | ICD-10-CM | POA: Diagnosis not present

## 2019-08-26 DIAGNOSIS — R17 Unspecified jaundice: Secondary | ICD-10-CM

## 2019-08-26 DIAGNOSIS — R079 Chest pain, unspecified: Secondary | ICD-10-CM | POA: Diagnosis not present

## 2019-08-26 DIAGNOSIS — M7989 Other specified soft tissue disorders: Secondary | ICD-10-CM | POA: Diagnosis not present

## 2019-08-26 DIAGNOSIS — R0781 Pleurodynia: Secondary | ICD-10-CM | POA: Diagnosis not present

## 2019-08-26 NOTE — Patient Instructions (Addendum)
Start with an xray for thoracic spine and ribs. [place] [id number] We will call you with results as soon as we get them.   We will then look more into the lump on your back.

## 2019-08-26 NOTE — Telephone Encounter (Signed)
Pt was told he likely would not be called back until Monday. Same day results for non urgent matters are not a realistic expectation. Results are triaged by urgency and as they are received, and that is how these will be handled. We will do our best.

## 2019-08-26 NOTE — Telephone Encounter (Addendum)
Pt was informed of following results. Area of his pain is over the old rib fractures he sustained 3 years ago. Possibly strained area. If not improved with NSAIDS and rest next 2 weeks, would obtain advanced imaging of ribs.  -  Calcified granuloma is noted in right lower lobe.  -  Minimal left basilar subsegmental atelectasis or scarring is noted. repeat cxr in 4 weeks.  - Probable subacute to old fractures are seen involving the right seventh, eighth and ninth ribs.  - thoracic spine normal.

## 2019-08-26 NOTE — Progress Notes (Signed)
Johnathan [name] , [birth year], 63 y.o., male MRN: [id number] Patient Care Team    Relationship Specialty Notifications Start End  Ma Hillock, DO PCP - General Family Medicine  04/15/17   Lorretta Harp, MD Consulting Physician Cardiology  03/22/18   Silverio Decamp, MD Consulting Physician Family Medicine  03/22/18   Irene Shipper, MD Consulting Physician Gastroenterology  07/19/19     Chief Complaint  Patient presents with  . Back Pain    right middle side. x2-3 weeks. not injury related. has not taken anything for pain. movement is not a problem.      Subjective: Pt presents for an OV with complaints of right of lateral thoracic pain  of 2-3 weeks duration.  Associated symptoms include pain worsens with movement.  He denies any known injury.  He states he was moving a umbrella table prior to onset who there is a potential he may have strained a muscle.  Pt has tried nothing to ease their symptoms.  He states the pain is mostly right lateral lower thoracic pain, but sometimes will radiate posteriorly to his back.  He states his back pain became worse after he was performing some more strenuous activity in the yard, the next day he was rather sore and felt like it might be "catching ".  He reports a history of broken ribs, but he thinks it was located on his left side.  Depression screen [facility] 2/9 07/19/2019 12/20/2018 03/22/2018 08/07/2017 04/15/2017  Decreased Interest 0 0 0 0 0  Down, Depressed, Hopeless 0 0 0 0 0  PHQ - 2 Score 0 0 0 0 0  Altered sleeping 0 0 - 0 -  Tired, decreased energy 0 0 - 0 -  Change in appetite 0 0 - 0 -  Feeling bad or failure about yourself  0 0 - 0 -  Trouble concentrating 0 0 - 0 -  Moving slowly or fidgety/restless 0 0 - 0 -  Suicidal thoughts 0 0 - 0 -  PHQ-9 Score 0 0 - 0 -    Allergies  Allergen Reactions  . Sulfa Antibiotics     Unknown reaction in childhood   Social History   Social History Narrative   Married to [place]. 2 children  [place].    BS degree.    Wear seatbelt. Wears a bicycle helmet. Smoke detector in the home.   Firearms in the home.   Feels safe in  Relationships.   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 Arthritis    hands and feet  . Hyperlipidemia   . Hypertension    pt denies  . Persistent atrial fibrillation   . RLS (restless legs syndrome)   . Sleep apnea    CPAP   Past Surgical History:  Procedure Laterality Date  . ATRIAL FIBRILLATION ABLATION  2015  . COLONOSCOPY    . FOOT SURGERY    . HERNIA REPAIR    . LUMBAR [place] SURGERY  2012   L3-L4, No hardware   Family History  Problem Relation Age of Onset  . Diabetes Mother   . Diabetes Brother   . Early death Brother   . Heart disease Brother   . Colon cancer Neg Hx   . Esophageal cancer Neg Hx   . Rectal cancer Neg Hx   .  Stomach cancer Neg Hx    Allergies as of 08/26/2019      Reactions   Sulfa Antibiotics    Unknown reaction in childhood      Medication List       Accurate as of August 26, 2019 12:40 PM. If you have any questions, ask your nurse or doctor.        atorvastatin 40 MG tablet Commonly known as: LIPITOR Take 1 tablet (40 mg total) by mouth daily. Patient needs follow up/CPE for further refills.   dofetilide 500 MCG capsule Commonly known as: TIKOSYN Take 1 capsule (500 mcg total) by mouth 2 (two) times daily.   escitalopram 5 MG tablet Commonly known as: LEXAPRO Take 1 tablet (5 mg total) by mouth daily. Needs office visit prior to any additional refills.       All past medical history, surgical history, allergies, family history, immunizations andmedications were updated in the EMR today and reviewed under the history and medication portions of their EMR.     ROS: Negative, with the exception of above mentioned in HPI   Objective:  BP 128/77 (BP Location:  Left Arm, Patient Position: Sitting, Cuff Size: Normal)   Pulse 68   Temp 98 F (36.7 C) (Temporal)   Resp 17   Ht 6\' 1"  (1.854 m)   Wt 228 lb 8 oz (103.6 kg)   SpO2 99%   BMI 30.15 kg/m  Body mass index is 30.15 kg/m. Gen: Afebrile. No acute distress. Nontoxic in appearance, well developed, well nourished.  HENT: AT. [place]. MMM Eyes:Pupils Equal Round Reactive to light, Extraocular movements intact,  Conjunctiva without redness, discharge or icterus. Chest: CTAB. Good air movement, normal resp effort.  Abd: Soft. flat. NTND.  No masses palpated. MSK:     Thoracic: No erythema.  Soft tissue swelling noted just inferiorly to scapula with a 5 x 5 x 2 cm (triangular-shaped) soft mobile mass without tenderness to palpation.  No thoracic spine tenderness.  No rib tenderness posterior, lateral or anteriorly.  Normal range of motion thoracic and lumbar spine without discomfort.  Pain reproduced with stretching right latissimus dorsi. Skin: No rashes, purpura or petechiae.  Neuro: Normal gait. PERLA. EOMi. Alert.    No exam data present No results found. No results found for this or any previous visit (from the past 24 hour(s)).  Assessment/Plan: Johnathan [name] is a 63 y.o. male present for OV for  Need for immunization against influenza - Flu Vaccine QUAD 36+ mos IM  Acute right-sided thoracic back pain/Mass of soft tissue, right thoracic back On exam today his pain was only able to be reproduced with stretching his latissimus dorsi on the right side by reaching up and across his body.  Given this finding I suspect this is likely a muscle strain, but cannot be certain.  Will obtain right rib series, thoracic and chest x-rays today. -During exam a odd shaped almost triangular soft tissue mass was palpated just inferiorly to his right scapula.  Uncertain if this is partially responsible for any of his discomfort.  His discomfort was few inches below and laterally to this mass though unlikely  related.  If x-rays do not give some indication of the properties of the mass would move forward to ultrasound for more evaluation. - consider NSAIDS and/or muscle relaxer dependent upon xray results.  - DG Thoracic Spine W/Swimmers; Future - DG Ribs Unilateral Right; Future - DG Chest 2 View; Future - f/u dependent upon xray results  >  25 minutes spent with patient, >50% of time spent face to face     Reviewed expectations re: course of current medical issues.  Discussed self-management of symptoms.  Outlined signs and symptoms indicating need for more acute intervention.  Patient verbalized understanding and all questions were answered.  Patient received an After-Visit Summary.    Orders Placed This Encounter  Procedures  . DG Thoracic Spine W/Swimmers  . DG Ribs Unilateral Right  . DG Chest 2 View  . Flu Vaccine QUAD 36+ mos IM     Note is dictated utilizing voice recognition software. Although note has been proof read prior to signing, occasional typographical errors still can be missed. If any questions arise, please do not hesitate to call for verification.   electronically signed by:  Howard Pouch, DO  [place]

## 2019-08-26 NOTE — Telephone Encounter (Signed)
Copied from [name] [id number]. Topic: General - Inquiry >> Aug 26, 2019  3:52 PM Johnathan [name] wrote: Reason for CRM: Patient called stating he would like PCP nurse to give him a call to go over his xrays Patient call back [phone]   Left me a voicemail that he would like xray results today before 5pm.  He is very anxious to get results.  Thank you

## 2019-08-26 NOTE — Telephone Encounter (Signed)
Reports have came back but have not been reviewed by Dr Raoul Pitch.

## 2019-08-27 LAB — BILIRUBIN, FRACTIONATED(TOT/DIR/INDIR)
Bilirubin, Direct: 0.2 mg/dL (ref 0.0–0.2)
Indirect Bilirubin: 1.2 mg/dL (calc) (ref 0.2–1.2)
Total Bilirubin: 1.4 mg/dL — ABNORMAL HIGH (ref 0.2–1.2)

## 2019-08-29 NOTE — Telephone Encounter (Signed)
Pt was called and given results. He will call back or send my chart message in two weeks to let us know if pain is better. Pt verbalized understanding and agreed with plan

## 2019-08-30 ENCOUNTER — Telehealth: Payer: Self-pay | Admitting: Family Medicine

## 2019-08-30 DIAGNOSIS — R109 Unspecified abdominal pain: Secondary | ICD-10-CM

## 2019-08-30 DIAGNOSIS — R222 Localized swelling, mass and lump, trunk: Secondary | ICD-10-CM

## 2019-08-30 DIAGNOSIS — R17 Unspecified jaundice: Secondary | ICD-10-CM

## 2019-08-30 NOTE — Telephone Encounter (Signed)
Please inform patient the following information: His bilirubin total is still higher than normal at 1.4 (normal 1.2) this is an improvement from prior but still above normal total.  We preformed an additional test that broke down the bilirubin into types to to further attempt to decipher cause and both types were at the highest end of normal ranges of 0.2 and 1.2. Which does not give [place] much to go on.  - to be complete, and since he does have that right side discomfort which is over his liver area (although also over his prior fx ribs), I believe we should perform an image of the liver.   I have ordered the abd [place] and the [place] of his soft tissue mass of his back- I have asked they be completed same day/time. Please have him call back to schedule an appt 5-7 days after [place] scheduled date to review results and discuss further plan (can be virtual appt). Pleas advise him to call as soon as he finds out his appt date for [place] to schedule here so we can make sure we have an appt slot for him.

## 2019-08-30 NOTE — Telephone Encounter (Signed)
Pt was called and given information, he verbalized understanding. He will call to schedule appt

## 2019-08-30 NOTE — Telephone Encounter (Signed)
Pt was called and message was left to return call  

## 2019-09-12 ENCOUNTER — Ambulatory Visit
Admission: RE | Admit: 2019-09-12 | Discharge: 2019-09-12 | Disposition: A | Discharge status: Home / Self Care | Payer: BC Managed Care – PPO | Source: Ambulatory Visit | Attending: Family Medicine | Admitting: Family Medicine

## 2019-09-12 DIAGNOSIS — R109 Unspecified abdominal pain: Secondary | ICD-10-CM

## 2019-09-12 DIAGNOSIS — R221 Localized swelling, mass and lump, neck: Secondary | ICD-10-CM | POA: Diagnosis not present

## 2019-09-12 DIAGNOSIS — R222 Localized swelling, mass and lump, trunk: Secondary | ICD-10-CM

## 2019-09-12 DIAGNOSIS — R17 Unspecified jaundice: Secondary | ICD-10-CM

## 2019-09-13 ENCOUNTER — Telehealth: Payer: Self-pay | Admitting: Family Medicine

## 2019-09-13 NOTE — Telephone Encounter (Signed)
Results will be covered with pt on his followup in 2 days

## 2019-09-15 ENCOUNTER — Other Ambulatory Visit: Payer: Self-pay

## 2019-09-15 ENCOUNTER — Encounter: Payer: Self-pay | Admitting: Family Medicine

## 2019-09-15 ENCOUNTER — Ambulatory Visit (INDEPENDENT_AMBULATORY_CARE_PROVIDER_SITE_OTHER): Payer: BC Managed Care – PPO | Admitting: Family Medicine

## 2019-09-15 DIAGNOSIS — R1011 Right upper quadrant pain: Secondary | ICD-10-CM

## 2019-09-15 DIAGNOSIS — R109 Unspecified abdominal pain: Secondary | ICD-10-CM

## 2019-09-15 DIAGNOSIS — R17 Unspecified jaundice: Secondary | ICD-10-CM

## 2019-09-15 NOTE — Progress Notes (Signed)
[name] , 01/20/1956, 63 y.o., male MRN: [id number] Patient Care Team    Relationship Specialty Notifications Start End  Ma Hillock, DO PCP - General Family Medicine  04/15/17   Lorretta Harp, MD Consulting Physician Cardiology  03/22/18   Silverio Decamp, MD Consulting Physician Family Medicine  03/22/18   Irene Shipper, MD Consulting Physician Gastroenterology  07/19/19     Chief Complaint  Patient presents with  . Follow-up    recent US     Subjective: Naszier Kaylor is a 63 y.o. male pt present today virtually to discuss results of Ultrasound studies and follow up on right flank and quadrant pain.  He states he is still having discomfort in his right upper abdomen.  Today he states the pain is more right lateral and midline.  He states his discomfort may be a little improved, but still present.  His discomfort has ranged from right upper flank, right upper quadrant and right lower thoracic in nature.  He reports he has noticed the discomfort more when laying flat or bending.  His bilirubin has continued to be mildly elevated for a little over a year.  His ultrasound of his abdomen was normal.  The ultrasound of the soft tissue mass on his back was normal.  He has no other systemic symptoms other than having abrupt hearing loss in his right ear over the last 2 months that is requiring him to be fitted for a hearing aid.  He has seen a few ENTs, which both agree at some point he was likely infected with a virus that created nerve damage in his ear.  Interestingly he had a similar event in [place] many years ago, but the hearing returned after procedure.  Prior note:  Pt presents for an OV with complaints of right of lateral thoracic pain  of 2-3 weeks duration.  Associated symptoms include pain worsens with movement.  He denies any known injury.  He states he was moving a umbrella table prior to onset who there is a potential he may have strained a muscle.  Pt has tried nothing  to ease their symptoms.  He states the pain is mostly right lateral lower thoracic pain, but sometimes will radiate posteriorly to his back.  He states his back pain became worse after he was performing some more strenuous activity in the yard, the next day he was rather sore and felt like it might be "catching ".  He reports a history of broken ribs, but he thinks it was located on his left side.  Depression screen [facility] 2/9 07/19/2019 12/20/2018 03/22/2018 08/07/2017 04/15/2017  Decreased Interest 0 0 0 0 0  Down, Depressed, Hopeless 0 0 0 0 0  PHQ - 2 Score 0 0 0 0 0  Altered sleeping 0 0 - 0 -  Tired, decreased energy 0 0 - 0 -  Change in appetite 0 0 - 0 -  Feeling bad or failure about yourself  0 0 - 0 -  Trouble concentrating 0 0 - 0 -  Moving slowly or fidgety/restless 0 0 - 0 -  Suicidal thoughts 0 0 - 0 -  PHQ-9 Score 0 0 - 0 -    Allergies  Allergen Reactions  . Sulfa Antibiotics     Unknown reaction in childhood   Social History   Social History Narrative   Married to [place]. 2 children [place].    BS degree.    Wear seatbelt.  Wears a bicycle helmet. Smoke detector in the home.   Firearms in the home.   Feels safe in  Relationships.   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 Arthritis    hands and feet  . Hyperlipidemia   . Hypertension    pt denies  . Persistent atrial fibrillation   . RLS (restless legs syndrome)   . Sleep apnea    CPAP   Past Surgical History:  Procedure Laterality Date  . ATRIAL FIBRILLATION ABLATION  2015  . COLONOSCOPY    . FOOT SURGERY    . HERNIA REPAIR    . LUMBAR [place] SURGERY  2012   L3-L4, No hardware   Family History  Problem Relation Age of Onset  . Diabetes Mother   . Diabetes Brother   . Early death Brother   . Heart disease Brother   . Colon cancer Neg Hx   . Esophageal cancer Neg Hx    . Rectal cancer Neg Hx   . Stomach cancer Neg Hx    Allergies as of 09/15/2019      Reactions   Sulfa Antibiotics    Unknown reaction in childhood      Medication List       Accurate as of September 15, 2019  8:18 AM. If you have any questions, ask your nurse or doctor.        atorvastatin 40 MG tablet Commonly known as: LIPITOR Take 1 tablet (40 mg total) by mouth daily. Patient needs follow up/CPE for further refills.   dofetilide 500 MCG capsule Commonly known as: TIKOSYN Take 1 capsule (500 mcg total) by mouth 2 (two) times daily.   escitalopram 5 MG tablet Commonly known as: LEXAPRO Take 1 tablet (5 mg total) by mouth daily. Needs office visit prior to any additional refills.       All past medical history, surgical history, allergies, family history, immunizations andmedications were updated in the EMR today and reviewed under the history and medication portions of their EMR.     ROS: Negative, with the exception of above mentioned in HPI   Objective:  There were no vitals taken for this visit. There is no height or weight on file to calculate BMI. Gen: Afebrile. No acute distress.  HENT: AT. [place].  Eyes:Pupils Equal Round Reactive to light, Extraocular movements intact,  Conjunctiva without redness, discharge or icterus. Neuro:  Alert. Oriented x3 Psych: Normal affect, dress and demeanor. Normal speech. Normal thought content and judgment.  No exam data present No results found. No results found for this or any previous visit (from the past 24 hour(s)).  Assessment/Plan: Bear Gala is a 63 y.o. male present for OV for  Acute right-sided thoracic back pain/Mass of soft tissue, right thoracic back/elevated bili -Discussed results with patient today in detail.  Ultrasound of back mass was normal, indicating this was more than likely a fatty tissue mass or scar tissue from his trauma when he fell many years ago in [place] and fractured ribs as well.  Reassured.  -His abdominal ultrasound was also normal, which is reassuring. -His pain of his right abdomen and lateral right thoracic has improved minimally, but still present.  We discussed his rib fractures, although many years ago, were located on the right lateral side posteriorly-initially thought this could be muscle skeletal pain but has  not responded as muscle skeletal pain would have in this timeline and now seems to be more anterior and within the abdomen.  Since he does have elevated bilirubin, I do believe he would benefit from speaking to his gastroenterologist for their opinion and rule out GI causes. -May need to have CT of the abdomen, will defer to GI after the evaluation -Patient was agreeable with plan. -Follow-up PRN    Reviewed expectations re: course of current medical issues.  Discussed self-management of symptoms.  Outlined signs and symptoms indicating need for more acute intervention.  Patient verbalized understanding and all questions were answered.  Patient received an After-Visit Summary.    Orders Placed This Encounter  Procedures  . Ambulatory referral to Gastroenterology    > 15 minutes spent with patient, > 50% of that time face to face  Note is dictated utilizing voice recognition software. Although note has been proof read prior to signing, occasional typographical errors still can be missed. If any questions arise, please do not hesitate to call for verification.   electronically signed by:  Howard Pouch, DO  [place]

## 2019-10-20 ENCOUNTER — Ambulatory Visit: Payer: BC Managed Care – PPO | Admitting: Internal Medicine

## 2019-10-20 ENCOUNTER — Other Ambulatory Visit: Payer: Self-pay

## 2019-10-20 ENCOUNTER — Encounter: Payer: Self-pay | Admitting: Family Medicine

## 2019-10-20 ENCOUNTER — Encounter: Payer: Self-pay | Admitting: Internal Medicine

## 2019-10-20 VITALS — BP 116/70 | HR 64 | Temp 97.6°F | Ht 73.0 in | Wt 227.8 lb

## 2019-10-20 DIAGNOSIS — Z86010 Personal history of colonic polyps: Secondary | ICD-10-CM | POA: Diagnosis not present

## 2019-10-20 DIAGNOSIS — M7918 Myalgia, other site: Secondary | ICD-10-CM

## 2019-10-20 DIAGNOSIS — R17 Unspecified jaundice: Secondary | ICD-10-CM | POA: Diagnosis not present

## 2019-10-20 NOTE — Patient Instructions (Signed)
If you are age 63 or older, your body mass index should be between 23-30. Your Body mass index is 30.05 kg/m. If this is out of the aforementioned range listed, please consider follow up with your Primary Care Provider.  If you are age 74 or younger, your body mass index should be between 19-25. Your Body mass index is 30.05 kg/m. If this is out of the aformentioned range listed, please consider follow up with your Primary Care Provider.   Follow up as needed.  It was a pleasure to see you today!  Dr. Scarlette Shorts

## 2019-10-20 NOTE — Progress Notes (Signed)
HISTORY OF PRESENT ILLNESS:  Johnathan [name] is a 63 y.o. 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  See that dictation for details.  He subsequently underwent colonoscopy on January 18, 2019.  He was found to have 3 diminutive adenomas and internal hemorrhoids.  Follow-up in 3 years recommended.  Patient is sent today regarding recent problems with right subscapular/side pain with slight radiation to the right upper quadrant which began a few months ago after vigorous work activities.  He had various types of x-rays including plain films with suggested old rib fractures on the right.  Over time his discomfort has subsided.  There is a positional component.  He has noticed some exacerbation with resumption of vigorous work activity.  However, overall, significantly improved.  He is sent today as well regarding elevated bilirubin.  He has had this several years ago as well.  Other liver tests normal.  Fractionated bilirubin was obtained.  Total bilirubin was 1.4.  Indirect fraction was 1.2.  There is no family history of liver disease no personal history of liver disease GI review of systems is negative.  CBC is normal with normal MCV and platelets.  Normal TSH.  Medications as listed without change.  REVIEW OF SYSTEMS:  All non-GI ROS negative unless otherwise stated in the HPI except for arthritis Past Medical History:  Diagnosis Date  . Arthritis    hands and feet  . Elevated LFTs   . Hyperlipidemia   . Hypertension    pt denies  . Persistent atrial fibrillation ([place])   . RLS (restless legs syndrome)   . Sleep apnea    CPAP    Past Surgical History:  Procedure Laterality Date  . ATRIAL FIBRILLATION ABLATION  2015  . COLONOSCOPY    . FOOT SURGERY Left   . HERNIA REPAIR    . LUMBAR [place] SURGERY  2012   L3-L4, No hardware     Social History [name]  reports that he has never smoked. He has quit using smokeless tobacco.  His smokeless tobacco use included chew. He reports current alcohol use of about 10.0 standard drinks of alcohol per week. He reports that he does not use drugs.  family history includes Diabetes in his brother and mother; Early death in his brother; Heart disease in his brother.  Allergies  Allergen Reactions  . Sulfa Antibiotics     Unknown reaction in childhood       PHYSICAL EXAMINATION: Vital signs: BP 116/70   Pulse 64   Temp 97.6 F (36.4 C)   Ht 6\' 1"  (1.854 m)   Wt 227 lb 12.8 oz (103.3 kg)   BMI 30.05 kg/m   Constitutional: generally well-appearing, no acute distress Psychiatric: alert and oriented x3, cooperative Eyes: extraocular movements intact, anicteric, conjunctiva pink Mouth: oral pharynx moist, no lesions Neck: supple no lymphadenopathy Cardiovascular: heart regular rate and rhythm, no murmur Lungs: clear to auscultation bilaterally Abdomen: soft, nontender, nondistended, no obvious ascites, no peritoneal signs, normal bowel sounds, no organomegaly Rectal: Omitted Extremities: no clubbing, cyanosis, or lower extremity edema bilaterally Skin: no lesions on visible extremities Neuro: No focal deficits. No asterixis.    ASSESSMENT:  1.  Musculoskeletal side pain secondary to work-related activities.  Improving 2.  Gilbert syndrome (benign unconjugated hyperbilirubinemia).  This is an inheritable condition without clinical consequence.  He would be expected to have  mild elevations of bilirubin with his routine blood work.  This could elevate into the 2-4 range with stress of illness or fasting.  Nothing further needs to be done in this regard, aside from awareness. 3.  History of multiple adenomatous colon polyps January 2020  PLAN:  1.  Reassurance regarding musculoskeletal pain 2.  Reassurance regarding Gilbert's syndrome 3.  Surveillance  colonoscopy around January 2023.  Interval GI follow-up as needed 4.  Return to the care of your primary provider, Dr. Raoul Pitch

## 2019-12-16 ENCOUNTER — Other Ambulatory Visit: Payer: Self-pay | Admitting: Cardiovascular Disease

## 2019-12-19 NOTE — Telephone Encounter (Signed)
Rx(s) sent to pharmacy electronically.  

## 2019-12-20 NOTE — Progress Notes (Signed)
Cardiology Clinic Note   Patient Name: Johnathan [name] Date of Encounter: 12/21/2019  Primary Care Provider:  Ma Hillock, DO Primary Cardiologist:  Quay Burow, MD  Patient Profile    Johnathan [name] 63 year old male presents today for follow-up of his permanent atrial fibrillation and OSA, and hyperlipidemia  Past Medical History    Past Medical History:  Diagnosis Date  . Arthritis    hands and feet  . Elevated LFTs   . Hyperlipidemia   . Hypertension    pt denies  . Persistent atrial fibrillation ([place])   . RLS (restless legs syndrome)   . Sleep apnea    CPAP   Past Surgical History:  Procedure Laterality Date  . ATRIAL FIBRILLATION ABLATION  2015  . COLONOSCOPY    . FOOT SURGERY Left   . HERNIA REPAIR    . LUMBAR DISC SURGERY  2012   L3-L4, No hardware    Allergies  Allergies  Allergen Reactions  . Sulfa Antibiotics     Unknown reaction in childhood    History of Present Illness    Johnathan [name] has past medical history of atrial fibrillation, hyperlipidemia and obstructive sleep apnea (CPAP intermittently).  He has a family history of cardiovascular disease and a brother who died of an MI at age 67.  He has never had a heart attack or CVA.  He has been treated by Dr. Tally Due at [facility] 8 years ago with an a fib ablation.  He has been managed well on Tikosyn and is not on oral anticoagulation.  He has had 4-5 DCCV procedures since his ablation.  His last DCCV was around 3 years ago at [facility] by Dr. Kelli Churn.   He was last seen 10/2018 by Dr. Gwenlyn Found he denied chest pain, shortness of breath.  He had had 3 episodes of PAF which were thought to be related to excessive alcohol intake.  He was also seen by Dr. Rayann Heman on 12/13/2018 and was doing well during that time.  He was continued on his Tikosyn 500 mcg twice daily.  He presents the clinic today for follow-up and states he feels well and has not had any episodes of atrial fibrillation in  12 months.  He states that he has a terrible diet and eats convenience foods.  He has gained between 15 to 20 pounds and has stopped exercising.  He noticed heart flutters/atrial fibrillation during a remote bout of activity and states he is a little bit paranoid about physical activity at this time.  He states he will change his diet and become more physically active.  He needs a new face mask and tubing for CPAP.  He has been trying to contact his supplier which has not been getting back to him.  We will have him contact Dr. Halford Chessman for further help with this.  He denies chest pain, shortness of breath, lower extremity edema, fatigue, palpitations, melena, hematuria, hemoptysis, diaphoresis, weakness, presyncope, syncope, orthopnea, and PND.  Home Medications    Prior to Admission medications   Medication Sig Start Date End Date Taking? Authorizing Provider  atorvastatin (LIPITOR) 40 MG tablet Take 1 tablet (40 mg total) by mouth daily. Patient needs follow up/CPE for further refills. 07/19/19   Kuneff, Renee A, DO  dofetilide (TIKOSYN) 500 MCG capsule Take 1 capsule (500 mcg total) by mouth 2 (two) times daily. NEEDS APPOINTMENT FOR FUTURE REFILLS 12/19/19   Lorretta Harp, MD  escitalopram (LEXAPRO) 5 MG tablet Take  1 tablet (5 mg total) by mouth daily. Needs office visit prior to any additional refills. 07/19/19   Ma Hillock, DO    Family History    Family History  Problem Relation Age of Onset  . Diabetes Mother   . Diabetes Brother   . Early death Brother   . Heart disease Brother   . Colon cancer Neg Hx   . Esophageal cancer Neg Hx   . Rectal cancer Neg Hx   . Stomach cancer Neg Hx   . Liver disease Neg Hx   . Pancreatic cancer Neg Hx    He indicated that his mother is alive. He indicated that his father is alive. He indicated that his sister is alive. He indicated that his brother is deceased. He indicated that his maternal grandmother is deceased. He indicated that his  maternal grandfather is deceased. He indicated that his paternal grandmother is deceased. He indicated that his paternal grandfather is deceased. He indicated that his daughter is alive. He indicated that his son is alive. He indicated that his maternal aunt is alive. He indicated that his maternal uncle is alive. He indicated that his paternal aunt is deceased. He indicated that his paternal uncle is deceased. He indicated that the status of his neg hx is unknown.  Social History    Social History   Socioeconomic History  . Marital status: Married    Spouse name: [name]  . Number of children: 2  . Years of education: 22  . Highest education level: Not on file  Occupational History  . Not on file  Tobacco Use  . Smoking status: Never Smoker  . Smokeless tobacco: Former Systems developer    Types: Chew  Substance and Sexual Activity  . Alcohol use: Yes    Alcohol/week: 10.0 standard drinks    Types: 10 Cans of beer per week  . Drug use: No  . Sexual activity: Yes    Partners: Female    Comment: Married  Other Topics Concern  . Not on file  Social History Narrative   Married to [place]. 2 children [place].    BS degree.    Wear seatbelt. Wears a bicycle helmet. Smoke detector in the home.   Firearms in the home.   Feels safe in  Relationships.   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 Difficulty of Paying Living Expenses: Not on file  Food Insecurity:   . Worried About Charity fundraiser in the Last Year: Not on file  . Ran Out of Food in the Last Year: Not on file  Transportation Needs:   . Lack of Transportation (Medical): Not on file  . Lack of Transportation (Non-Medical): Not on file  Physical Activity:   . Days of Exercise per Week: Not on file  . Minutes of Exercise per Session: Not on file  Stress:   . Feeling of  Stress : Not on file  Social Connections:   . Frequency of Communication with Friends and Family: Not on file  . Frequency of Social Gatherings with Friends and Family: Not on file  . Attends Religious Services: Not on file  . Active Member of Clubs or Organizations: Not on file  . Attends Archivist Meetings: Not on file  . Marital Status: Not on file  Intimate Partner Violence:   . Fear of Current or Ex-Partner: Not on file  . Emotionally Abused: Not on file  . Physically Abused: Not on file  . Sexually Abused: Not on file     Review of Systems    General:  No chills, fever, night sweats or weight changes.  Cardiovascular:  No chest pain, dyspnea on exertion, edema, orthopnea, palpitations, paroxysmal nocturnal dyspnea. Dermatological: No rash, lesions/masses Respiratory: No cough, dyspnea Urologic: No hematuria, dysuria Abdominal:   No nausea, vomiting, diarrhea, bright red blood per rectum, melena, or hematemesis Neurologic:  No visual changes, wkns, changes in mental status. All other systems reviewed and are otherwise negative except as noted above.  Physical Exam    VS:  BP (!) 150/92   Pulse 71   Ht 6\' 1"  (1.854 m)   Wt 234 lb 12.8 oz (106.5 kg)   SpO2 99%   BMI 30.98 kg/m  , BMI Body mass index is 30.98 kg/m. GEN: Well nourished, well developed, in no acute distress. HEENT: normal. Neck: Supple, no JVD, carotid bruits, or masses. Cardiac: RRR, no murmurs, rubs, or gallops. No clubbing, cyanosis, edema.  Radials/DP/PT 2+ and equal bilaterally.  Respiratory:  Respirations regular and unlabored, clear to auscultation bilaterally. GI: Soft, nontender, nondistended, BS + x 4. MS: no deformity or atrophy. Skin: warm and dry, no rash. Neuro:  Strength and sensation are intact. Psych: Normal affect.  Accessory Clinical Findings    ECG personally reviewed by me today-sinus rhythm 71 bpm- No acute changes  EKG 11/17/2018 Sinus bradycardia 54  bpm  Assessment & Plan   1.  Persistent atrial fibrillation-EKG today shows sinus rhythm 71 bpm.  Previous ablation at [facility] around 9 years ago.  Underwent last DCCV 3 years ago at [facility]. Continue Tikosyn 500 mg tablet twice daily Avoid triggers EtOH, caffeine, chocolate, sleep apnea, etc. Increase physical activity as tolerated-goal 150 minutes of moderate physical activity weekly Heart healthy low-sodium diet  Hyperlipidemia-LDL 112 07/18/2019-had a lengthy conversation about increasing the fiber in his diet and increasing his physical activity.  He wishes to try this before adding ezetimibe. Continue atorvastatin 40 mg tablet daily Heart healthy low-sodium high-fiber diet Increase physical activity as tolerated  Obstructive sleep apnea-on CPAP.  Currently using 3 times per week.  Needs new face mask and parts for his machine.  We will have him follow-up with Dr. Halford Chessman in regard to this.   Follow-up with Dr. Gwenlyn Found in 12 months.  Jossie Ng. [facility] [place] [address] Office [phone] Fax [phone]

## 2019-12-21 ENCOUNTER — Encounter: Payer: Self-pay | Admitting: General Practice

## 2019-12-21 ENCOUNTER — Ambulatory Visit: Payer: BC Managed Care – PPO | Admitting: General Practice

## 2019-12-21 ENCOUNTER — Other Ambulatory Visit: Payer: Self-pay

## 2019-12-21 VITALS — BP 128/78 | HR 71 | Ht 73.0 in | Wt 234.8 lb

## 2019-12-21 DIAGNOSIS — G4733 Obstructive sleep apnea (adult) (pediatric): Secondary | ICD-10-CM | POA: Diagnosis not present

## 2019-12-21 DIAGNOSIS — Z9989 Dependence on other enabling machines and devices: Secondary | ICD-10-CM | POA: Diagnosis not present

## 2019-12-21 DIAGNOSIS — E7849 Other hyperlipidemia: Secondary | ICD-10-CM | POA: Diagnosis not present

## 2019-12-21 DIAGNOSIS — I4819 Other persistent atrial fibrillation: Secondary | ICD-10-CM

## 2019-12-21 NOTE — Patient Instructions (Signed)
Medication Instructions:  The current medical regimen is effective;  continue present plan and medications as directed. Please refer to the Current Medication list given to you today. If you need a refill on your cardiac medications before your next appointment, please call your pharmacy.  Follow-Up: IN 12 months Please call our office 2 months in advance, OCT 2021 to schedule this [place] 2021 appointment. Either In Person or Virtual You may see Quay Burow, MD or one of the following Advanced Practice Providers on your designated Care Team: Coletta Memos, [place], PA-C  [place], [place].    Special Instructions: PLEASE CALL DR [facility] FOR YOUR CPAP NEEDS Phone: [phone]  Reduce your risk of getting COVID-19 With your heart disease it is especially important for people at increased risk of severe illness from COVID-19, and those who live with them, to protect themselves from getting COVID-19. The best way to protect yourself and to help reduce the spread of the virus that causes COVID-19 is to: [name] Limit your interactions with other people as much as possible. . Take precautions to prevent getting COVID-19 when you do interact with others. If you start feeling sick and think you may have COVID-19, get in touch with your healthcare provider within 24  At [facility], you and your health needs are our priority.  As part of our continuing mission to provide you with exceptional heart care, we have created designated Provider Care Teams.  These Care Teams include your primary Cardiologist (physician) and Advanced Practice Providers (APPs -  Physician Assistants and Nurse Practitioners) who all work together to provide you with the care you need, when you need it.  Thank you for choosing [facility] at [facility]!!        Happy Holidays!!

## 2019-12-30 DIAGNOSIS — Z974 Presence of external hearing-aid: Secondary | ICD-10-CM

## 2019-12-30 HISTORY — DX: Presence of external hearing-aid: Z97.4

## 2020-01-04 ENCOUNTER — Telehealth: Payer: Self-pay | Admitting: Cardiovascular Disease

## 2020-01-04 NOTE — Telephone Encounter (Signed)
Pt would like to start a Multi Vitamin and wanted to ask the [facility] of he should worry about any contraindications with his Tikosyn and what their recommendations are for something OTC he can purchase. Will forward to the [facility] for recommendations and review.

## 2020-01-04 NOTE — Telephone Encounter (Signed)
Pt advised.

## 2020-01-04 NOTE — Telephone Encounter (Signed)
Patient states that he would like to add a new vitamin to his diet. He is inquiring about whether he is able to take a additional supplements because he is concerned that it may not interact well with his heart arrhythmia medication. Patient states he would also like recommendations for said supplement. Please call and advise.

## 2020-01-04 NOTE — Telephone Encounter (Signed)
Regular Multi-vitamin is NOT expected to interact with your Tikosyn. If additional supplement is desired, please provide the name of the supplement and we can verify if safe to take.

## 2020-01-06 ENCOUNTER — Telehealth: Payer: Self-pay | Admitting: Cardiovascular Disease

## 2020-01-06 NOTE — Telephone Encounter (Signed)
New Message    Pt is calling and says Dr Gwenlyn Found told him he could see a sleep Dr in our Clinic    Please advise

## 2020-01-06 NOTE — Telephone Encounter (Signed)
Pt states he can not get any of his CPAP supplies d/t expired prescription. He would like to know who Dr. Gwenlyn Found suggests he say as a sleep doctor for his sleep apnea. Will route to [place] for advisement.

## 2020-01-09 NOTE — Telephone Encounter (Signed)
Make him an appointment to see Dr Claiborne Billings for OSA

## 2020-01-25 ENCOUNTER — Ambulatory Visit: Payer: BC Managed Care – PPO | Admitting: Cardiovascular Disease

## 2020-01-25 ENCOUNTER — Encounter: Payer: Self-pay | Admitting: Cardiovascular Disease

## 2020-01-25 ENCOUNTER — Other Ambulatory Visit: Payer: Self-pay

## 2020-01-25 VITALS — BP 133/83 | HR 62 | Temp 97.3°F | Ht 73.0 in | Wt 236.4 lb

## 2020-01-25 DIAGNOSIS — I4819 Other persistent atrial fibrillation: Secondary | ICD-10-CM

## 2020-01-25 DIAGNOSIS — Z9989 Dependence on other enabling machines and devices: Secondary | ICD-10-CM | POA: Diagnosis not present

## 2020-01-25 DIAGNOSIS — G4733 Obstructive sleep apnea (adult) (pediatric): Secondary | ICD-10-CM | POA: Diagnosis not present

## 2020-01-25 DIAGNOSIS — E7800 Pure hypercholesterolemia, unspecified: Secondary | ICD-10-CM | POA: Diagnosis not present

## 2020-01-25 NOTE — Patient Instructions (Addendum)
Medication Instructions:  Continue current medications  *If you need a refill on your cardiac medications before your next appointment, please call your pharmacy*  Lab Work: None Ordered  Testing/Procedures: None Ordered  Follow-Up: At [facility], you and your health needs are our priority.  As part of our continuing mission to provide you with exceptional heart care, we have created designated Provider Care Teams.  These Care Teams include your primary Cardiologist (physician) and Advanced Practice Providers (APPs -  Physician Assistants and Nurse Practitioners) who all work together to provide you with the care you need, when you need it.  Your next appointment:   3 month(s)  The format for your next appointment:   In Person  Provider:   Shelva Majestic, MD [facility]

## 2020-01-25 NOTE — Progress Notes (Signed)
Cardiology Office Note    Date:  01/27/2020   ID:  Woodward Klem, DOB Sep 13, 1956, MRN [id number]  PCP:  Ma Hillock, DO  Cardiologist:  Shelva Majestic, MD (sleep); Dr. Gwenlyn Found  New Sleep evaluation  History of Present Illness:  Johnathan [name] is a 64 y.o. male who is followed by Dr. Gwenlyn Found for his cardiology care.  He has a history of obstructive sleep apnea originally diagnosed in 2013 after he had developed atrial fibrillation.Johnathan [name]  He presents to establish sleep care with me in follow-up of his sleep apnea.  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  In the past, he was found to have atrial fibrillation and he underwent ablation at  [facility] in 2013 by Dr. Tally Due.  He subsequently had several cardioversions following his ablation with his most recent episode approximately 2 years ago at [facility].  In 2013, following his diagnosis of atrial fibrillation, he underwent diagnostic polysomnogram at [facility] sleep diagnostic center.  He was found to have moderate overall sleep apnea with an AHI of 17.2/h and RDI of 17.9/h.  Events were worse during REM sleep with an AHI of 24.5/h supine sleep AHI was 23/h.  Oxygen nadir was 84%.  He apparently has been on CPAP therapy since that time.  He has an old Respironics CPAP unit which he brought with him to the office today.  He established cardiology care with Dr. Gwenlyn Found in 2018.  He has family history for CAD.  The patient has been on Tikosyn in addition to atorvastatin and Lexapro as medical therapy.  He admits to continuous but not consistent CPAP use.  Typically he goes to bed at 10 AM and wakes up at 5:30 AM.  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  He previously had been very active following his baseball career and would exercise regularly.  However his exercise has sloughed off.  He brought his CPAP unit with him to the  office today and interrogation by me revealed only a 30-day average of 3.18 hours per night.  His CPAP pressure is set at 10 cm with a ramp time of 20.  AHI for last evening was 2.2/h.  I was unable to obtain the AHI data for the last 30 days.  An Epworth Sleepiness Scale score was calculated in the office today this was a 2.  He has a history of snoring, previous nonrestorative sleep.  There is no daytime sleepiness.  He denies bruxism.  There are no episodes of sleep paralysis or cataplectic events.  He presents for initial sleep evaluation with me.   Past Medical History:  Diagnosis Date  . Arthritis    hands and feet  . Elevated LFTs   . Hyperlipidemia   . Hypertension    pt denies  . Persistent atrial fibrillation ([place])   . RLS (restless legs syndrome)   . Sleep apnea    CPAP    Past Surgical History:  Procedure Laterality Date  . ATRIAL FIBRILLATION ABLATION  2015  . COLONOSCOPY    . FOOT SURGERY Left   . HERNIA REPAIR    . LUMBAR [place] SURGERY  2012   L3-L4, No hardware    Current Medications: Outpatient Medications Prior to Visit  Medication Sig Dispense Refill  . atorvastatin (LIPITOR) 40 MG tablet Take 1 tablet (40 mg total) by mouth daily. Patient needs  follow up/CPE for further refills. 90 tablet 3  . dofetilide (TIKOSYN) 500 MCG capsule Take 1 capsule (500 mcg total) by mouth 2 (two) times daily. NEEDS APPOINTMENT FOR FUTURE REFILLS 180 capsule 0  . escitalopram (LEXAPRO) 5 MG tablet Take 1 tablet (5 mg total) by mouth daily. Needs office visit prior to any additional refills. 90 tablet 1   No facility-administered medications prior to visit.     Allergies:   Sulfa antibiotics   Social History   Socioeconomic History  . Marital status: Married    Spouse name: [name]  . Number of children: 2  . Years of education: 77  . Highest education level: Not on file  Occupational History  . Not on file  Tobacco Use  . Smoking status: Never Smoker  . Smokeless  tobacco: Former Systems developer    Types: Chew  Substance and Sexual Activity  . Alcohol use: Yes    Alcohol/week: 10.0 standard drinks    Types: 10 Cans of beer per week  . Drug use: No  . Sexual activity: Yes    Partners: Female    Comment: Married  Other Topics Concern  . Not on file  Social History Narrative   Married to [place]. 2 children [place].    BS degree.    Wear seatbelt. Wears a bicycle helmet. Smoke detector in the home.   Firearms in the home.   Feels safe in  Relationships.   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 Difficulty of Paying Living Expenses: Not on file  Food Insecurity:   . Worried About Charity fundraiser in the Last Year: Not on file  . Ran Out of Food in the Last Year: Not on file  Transportation Needs:   . Lack of Transportation (Medical): Not on file  . Lack of Transportation (Non-Medical): Not on file  Physical Activity:   . Days of Exercise per Week: Not on file  . Minutes of Exercise per Session: Not on file  Stress:   . Feeling of Stress : Not on file  Social Connections:   . Frequency of Communication with Friends and Family: Not on file  . Frequency of Social Gatherings with Friends and Family: Not on file  . Attends Religious Services: Not on file  . Active Member of Clubs or Organizations: Not on file  . Attends Archivist Meetings: Not on file  . Marital Status: Not on file    Socially he was born in [place].  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  He lived 45 years in [place], and spent time living in [place] as well as [place] prior to moving to [place].  He currently is in the third marriage.  He has 2 children ages 70 and 8.  He currently is vice president of operations at [employer] History:  The patient's family history includes Diabetes in his brother and mother; Early death in his brother; Heart disease in his brother.   Family history is notable that his father is 67 and healthy.  Mother is 39 and has diabetes mellitus.  1 brother died at age 48 with a myocardial infarction.  1 sister is alive at  61.  ROS General: Negative; No fevers, chills, or night sweats;  HEENT: Negative; No changes in vision or hearing, sinus congestion, difficulty swallowing Pulmonary: Negative; No cough, wheezing, shortness of breath, hemoptysis Cardiovascular : History of atrial fibrillation, status post ablation at [facility] in [place].  Status post 4 cardioversions.  No chest pain, no recent palpitations. GI: Negative; No nausea, vomiting, diarrhea, or abdominal pain GU: Negative; No dysuria, hematuria, or difficulty voiding Musculoskeletal: Negative; no myalgias, joint pain, or weakness Hematologic/Oncology: Negative; no easy bruising, bleeding Endocrine: Negative; no heat/cold intolerance; no diabetes Neuro: Negative; no changes in balance, headaches Skin: Negative; No rashes or skin lesions Psychiatric: Negative; No behavioral problems, depression Sleep: See HPI  other comprehensive 14 point system review is negative.   PHYSICAL EXAM:   VS:  BP 133/83   Pulse 62   Temp (!) 97.3 F (36.3 C)   Ht _0  (1.854 m)   Wt 236 lb 6.4 oz (107.2 kg)   SpO2 97%   BMI 31.19 kg/m     Repeat blood pressure by me 130/78  Wt Readings from Last 3 Encounters:  01/25/20 236 lb 6.4 oz (107.2 kg)  12/21/19 234 lb 12.8 oz (106.5 kg)  10/20/19 227 lb 12.8 oz (103.3 kg)    General: Alert, oriented, no distress.  Skin: normal turgor, no rashes, warm and dry HEENT: Normocephalic, atraumatic. Pupils equal round and reactive to light; sclera anicteric; extraocular muscles intact;  Nose without nasal septal hypertrophy Mouth/Parynx benign; Mallinpatti scale  2 Neck: No JVD, no carotid bruits; normal carotid upstroke Lungs: clear to ausculatation and percussion; no wheezing or rales Chest wall: without tenderness to palpitation Heart: PMI not displaced, RRR, s1 s2 normal, 1/6 systolic murmur, no diastolic murmur, no rubs, gallops, thrills, or heaves Abdomen: soft, nontender; no hepatosplenomehaly, BS+; abdominal aorta nontender and not dilated by palpation. Back: no CVA tenderness Pulses 2+ Musculoskeletal: full range of motion, normal strength, no joint deformities Extremities: no clubbing cyanosis or edema, Homan's sign negative  Neurologic: grossly nonfocal; Cranial nerves grossly wnl Psychologic: Normal mood and affect   Studies/Labs Reviewed:   EKG:  EKG is ordered today.  ECG (independently read by me): NSR at 62; normal intervals, no ectopy.  Recent Labs: BMP Latest Ref Rng & Units 07/26/2019 07/26/2019 12/13/2018  Glucose 65 - 99 mg/dL 97 - 88  BUN 7 - 25 mg/dL _1 Creatinine 0.70 - 1.25 mg/dL 0.95 - 0.98  BUN/Creat Ratio 6 - 22 (calc) NOT APPLICABLE - 21  Sodium 135 - 146 mmol/L 140 140 138  Potassium 3.5 - 5.3 mmol/L 4.4 - 4.6  Chloride 98 - 110 mmol/L 104 - 100  CO2 20 - 32 mmol/L 24 - 23  Calcium 8.6 - 10.3 mg/dL 9.3 - 9.6     Hepatic Function Latest Ref Rng & Units 08/26/2019 07/26/2019 03/22/2018  Total Protein 6.1 - 8.1 g/dL - 6.7 6.7  Albumin 3.5 - 5.2 g/dL - - 4.1  AST 10 - 35 U/L - 22 25  ALT 9 - 46 U/L - 17 20  Alk Phosphatase 25 - 125 - 80 81  Total Bilirubin 0.2 - 1.2 mg/dL 1.4(H) 1.8(H) 1.5(H)  Bilirubin, Direct 0.0 - 0.2 mg/dL 0.2 - -    CBC Latest Ref Rng & Units 07/26/2019 07/26/2019 03/22/2018  WBC 3.8 - 10.8 Thousand/uL 7.0 7.0 5.8  Hemoglobin 13.2 - 17.1 g/dL 16.1 - 15.7  Hematocrit 38.5 - 50.0 % 47.4 - 45.7  Platelets 140 -  400 Thousand/uL 266 - 264.0   Lab Results  Component Value Date   MCV 90.5 07/26/2019   MCV 90.4 03/22/2018   MCV 89.1 11/15/2017   Lab Results  Component Value Date    TSH 2.71 07/26/2019   Lab Results  Component Value Date   HGBA1C 5.8 (H) 07/26/2019     BNP No results found for: BNP  ProBNP No results found for: PROBNP   Lipid Panel     Component Value Date/Time   CHOL 179 07/26/2019 0908   TRIG 90 07/26/2019 0908   HDL 48 07/26/2019 0908   CHOLHDL 3.7 07/26/2019 0908   VLDL 24.8 12/20/2018 0857   LDLCALC 112 (H) 07/26/2019 0908     RADIOLOGY: No results found.   Additional studies/ records that were reviewed today include:  I reviewed the patient's initial sleep study from 2013 at [facility] sleep diagnostics are.  I reviewed records of Dr. Gwenlyn Found and Coletta Memos, NP.  I personally interrogated his CPAP machine which is not wireless.  ASSESSMENT:    1. OSA on CPAP   2. History of persistent atrial fibrillation [facility]): Status post ablation and subsequent 4 cardioversions   3. Pure hypercholesterolemia      PLAN:  Mr. Presley Summerlin is a 64 year old gentleman who has a history of remote persistent atrial fibrillation ultimately undergoing atrial fibrillation ablation at [facility] by Dr. Rolland Porter approximately 8 years ago.  He has been on Tikosyn therapy.  At present he is not on any anticoagulation and has been maintaining sinus rhythm.  He is status post 4 cardioversions since his initial ablation.  I reviewed his sleep 20 which demonstrates moderate overall sleep apnea with an AHI of 17.2/h.  Events are more significant during REM sleep and in the supine position.  He had significant oxygen desaturation to a nadir of 84%.  His machine is now 64 years old.  He has been using CPAP but typically has not been using it for the entire nights duration.  I had a long discussion with him in the office today reviewed normal sleep architecture and the effects of sleep apnea in inducing abnormal sleep architecture.  I specifically discussed potential exercising for maternal arrhythmias, latency  to REM sleep and reduction in REM sleep.  His sleep apnea is more significant and REM sleep and we discussed that the preponderance of rem sleep occurs in the second half of the night.  Presently, he is not using CPAP for the nights entirety and over the last 30 days has only been using treatment for 3.1 hours per night.  I also discussed the effects of sleep apnea on increased nocturnal urination, its effects on glucose metabolism as well as GERD and potential risk for ischemic events particularly with significant nocturnal hypoxemia in the setting of CAD or cerebrovascular disease.  I will prescribe a new ResMed air sense 10 CPAP auto unit and will initially set this at a range of 8 to 20 cm of water.  I also have recommended a trial of the ResMed air fit F 30i mask which I think he would do better with compared to his current fullface mask which results in  potential nasal bridge irritation and leak.  I will set him up with [facility] as his DME company.  He continues to be on atorvastatin 40 mg for hyperlipidemia and Tikosyn 500 mg twice a day for his history of atrial fibrillation.  QTc  interval today on ECG is normal.  Medication Adjustments/Labs and Tests Ordered: Current medicines are reviewed at length with the patient today.  Concerns regarding medicines are outlined above.  Medication changes, Labs and Tests ordered today are listed in the Patient Instructions below. Patient Instructions  Medication Instructions:  Continue current medications  *If you need a refill on your cardiac medications before your next appointment, please call your pharmacy*  Lab Work: None Ordered  Testing/Procedures: None Ordered  Follow-Up: At [facility], you and your health needs are our priority.  As part of our continuing mission to provide you with exceptional heart care, we have created designated Provider Care Teams.  These Care Teams include your primary Cardiologist (physician) and Advanced  Practice Providers (APPs -  Physician Assistants and Nurse Practitioners) who all work together to provide you with the care you need, when you need it.  Your next appointment:   3 month(s)  The format for your next appointment:   In Person  Provider:   Shelva Majestic, MD Sleep Clinic       Signed, Shelva Majestic, MD  01/27/2020 3:47 PM    [facility] [address] [place], [place], [place]  [address] Phone: [phone]

## 2020-01-27 ENCOUNTER — Encounter: Payer: Self-pay | Admitting: Cardiovascular Disease

## 2020-01-30 ENCOUNTER — Telehealth: Payer: Self-pay | Admitting: *Deleted

## 2020-01-30 NOTE — Telephone Encounter (Signed)
Orders sent to [facility] for new CPAP machine with masks and supplies.

## 2020-02-24 DIAGNOSIS — G4733 Obstructive sleep apnea (adult) (pediatric): Secondary | ICD-10-CM | POA: Diagnosis not present

## 2020-02-25 ENCOUNTER — Other Ambulatory Visit: Payer: Self-pay | Admitting: Family Medicine

## 2020-02-25 DIAGNOSIS — F419 Anxiety disorder, unspecified: Secondary | ICD-10-CM

## 2020-02-29 ENCOUNTER — Telehealth: Payer: Self-pay

## 2020-02-29 DIAGNOSIS — F419 Anxiety disorder, unspecified: Secondary | ICD-10-CM

## 2020-02-29 MED ORDER — ESCITALOPRAM OXALATE 5 MG PO TABS: 5.0000 mg | ORAL_TABLET | Freq: Every day | ORAL | 0 refills | Status: DC

## 2020-02-29 NOTE — Telephone Encounter (Signed)
Two weeks worth of medications sent to pharmacy to last until patients next appt

## 2020-02-29 NOTE — Telephone Encounter (Signed)
Refill request - out of meds completely - scheduled appt for 3/10  escitalopram (LEXAPRO) 5 MG tablet [id number]   [place]

## 2020-03-02 ENCOUNTER — Other Ambulatory Visit: Payer: Self-pay

## 2020-03-02 DIAGNOSIS — M25561 Pain in right knee: Secondary | ICD-10-CM

## 2020-03-05 ENCOUNTER — Ambulatory Visit: Payer: BC Managed Care – PPO | Admitting: Family Medicine

## 2020-03-05 ENCOUNTER — Ambulatory Visit: Payer: Self-pay

## 2020-03-05 ENCOUNTER — Other Ambulatory Visit: Payer: Self-pay

## 2020-03-05 ENCOUNTER — Encounter: Payer: Self-pay | Admitting: Family Medicine

## 2020-03-05 ENCOUNTER — Ambulatory Visit
Admission: RE | Admit: 2020-03-05 | Discharge: 2020-03-05 | Disposition: A | Discharge status: Home / Self Care | Payer: BC Managed Care – PPO | Source: Ambulatory Visit | Attending: Family Medicine | Admitting: Family Medicine

## 2020-03-05 VITALS — BP 146/90 | Ht 74.0 in | Wt 235.0 lb

## 2020-03-05 DIAGNOSIS — M25561 Pain in right knee: Secondary | ICD-10-CM

## 2020-03-05 DIAGNOSIS — M7041 Prepatellar bursitis, right knee: Secondary | ICD-10-CM | POA: Diagnosis not present

## 2020-03-05 NOTE — Patient Instructions (Addendum)
You have chronic prepatellar bursitis. The bursa is thickened on ultrasound with probable tophus as well. We will refer you to Dr. Griffin Basil to discuss excision of the bursa.  [place] [phone]  Appt: 03/06/20 @ 2:30 pm  Consider compression, icing as needed though at this point and with how hard the bursa is, it's unlikely to cause further resolution of this. Follow up with me as needed.

## 2020-03-05 NOTE — Progress Notes (Signed)
PCP: Ma Hillock, DO  Subjective:   HPI: Patient is a 64 y.o. male here for right knee pain, swelling.  Patient denies acute injury. He reports over 8 months ago he recalls swelling anterior right knee with some pain after repetitive kneeling, working in the yard. No fevers, chills, sweats, redness. Went to Urgent Care in [facility] and had this drained which helped but had some recurrence of swelling. Since that time continues to have swelling anterior knee with firm 'rock' feeling in the area - bothersome especially with kneeling. Has not been taking anything for this or icing - no real pain associated with this now. No giving out, locking, catching. He does have history of gout.  Past Medical History:  Diagnosis Date  . Arthritis    hands and feet  . Elevated LFTs   . Hyperlipidemia   . Hypertension    pt denies  . Persistent atrial fibrillation ([place])   . RLS (restless legs syndrome)   . Sleep apnea    CPAP    Current Outpatient Medications on File Prior to Visit  Medication Sig Dispense Refill  . atorvastatin (LIPITOR) 40 MG tablet Take 1 tablet (40 mg total) by mouth daily. Patient needs follow up/CPE for further refills. 90 tablet 3  . dofetilide (TIKOSYN) 500 MCG capsule Take 1 capsule (500 mcg total) by mouth 2 (two) times daily. NEEDS APPOINTMENT FOR FUTURE REFILLS 180 capsule 0  . escitalopram (LEXAPRO) 5 MG tablet Take 1 tablet (5 mg total) by mouth daily. Needs office visit prior to any additional refills. 14 tablet 0   No current facility-administered medications on file prior to visit.    Past Surgical History:  Procedure Laterality Date  . ATRIAL FIBRILLATION ABLATION  2015  . COLONOSCOPY    . FOOT SURGERY Left   . HERNIA REPAIR    . LUMBAR [place] SURGERY  2012   L3-L4, No hardware    Allergies  Allergen Reactions  . Sulfa Antibiotics     Unknown reaction in childhood    Social History   Socioeconomic History  . Marital status: Married   Spouse name: [name]  . Number of children: 2  . Years of education: 60  . Highest education level: Not on file  Occupational History  . Not on file  Tobacco Use  . Smoking status: Never Smoker  . Smokeless tobacco: Former Systems developer    Types: Chew  Substance and Sexual Activity  . Alcohol use: Yes    Alcohol/week: 10.0 standard drinks    Types: 10 Cans of beer per week  . Drug use: No  . Sexual activity: Yes    Partners: Female    Comment: Married  Other Topics Concern  . Not on file  Social History Narrative   Married to [place]. 2 children [place].    BS degree.    Wear seatbelt. Wears a bicycle helmet. Smoke detector in the home.   Firearms in the home.   Feels safe in  Relationships.   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 Difficulty of Paying Living Expenses: Not on file  Food Insecurity:   . Worried About Charity fundraiser in the Last Year: Not on file  . Ran Out of Food in the Last Year: Not on file  Transportation  Needs:   . Lack of Transportation (Medical): Not on file  . Lack of Transportation (Non-Medical): Not on file  Physical Activity:   . Days of Exercise per Week: Not on file  . Minutes of Exercise per Session: Not on file  Stress:   . Feeling of Stress : Not on file  Social Connections:   . Frequency of Communication with Friends and Family: Not on file  . Frequency of Social Gatherings with Friends and Family: Not on file  . Attends Religious Services: Not on file  . Active Member of Clubs or Organizations: Not on file  . Attends Archivist Meetings: Not on file  . Marital Status: Not on file  Intimate Partner Violence:   . Fear of Current or Ex-Partner: Not on file  . Emotionally Abused: Not on file  . Physically Abused: Not on file  . Sexually Abused: Not on file    Family  History  Problem Relation Age of Onset  . Diabetes Mother   . Diabetes Brother   . Early death Brother   . Heart disease Brother   . Colon cancer Neg Hx   . Esophageal cancer Neg Hx   . Rectal cancer Neg Hx   . Stomach cancer Neg Hx   . Liver disease Neg Hx   . Pancreatic cancer Neg Hx     BP (!) 146/90   Ht 6\' 2"  (1.88 m)   Wt 235 lb (106.6 kg)   BMI 30.17 kg/m   Review of Systems: See HPI above.     Objective:  Physical Exam:  Gen: NAD, comfortable in exam room  Right knee: Swollen prepatellar bursa without fluctuance - firm.  No effusion, bruising, other deformity. No TTP including joint lines. FROM with normal strength. Negative ant/post drawers. Negative valgus/varus testing. Negative lachmans. Negative mcmurrays, apleys, patellar apprehension. NV intact distally.  Limited MSK u/s right knee:  Quad and patellar tendons intact.  Prepatellar bursitis but no calcifications, anechoic pockets visualized.  Sound waves unable to penetrate through bursa to patella due to thickening.  No neovascularity.   Assessment & Plan:  1. Right prepatellar bursitis - chronic, present over 8 months.  Discussed conservative management - has had aspiration previously and no pocket today on ultrasound to repeat this - in fact, very thickened capsule and contents of bursa concerning for possible tophus.  We discussed compression, knee pad to protect this.  He would like to have this removed - will refer to ortho to discuss further.  Tylenol if needed but not causing pain currently.

## 2020-03-06 ENCOUNTER — Other Ambulatory Visit: Payer: Self-pay | Admitting: Family Medicine

## 2020-03-06 ENCOUNTER — Other Ambulatory Visit: Payer: Self-pay | Admitting: Cardiovascular Disease

## 2020-03-06 DIAGNOSIS — F419 Anxiety disorder, unspecified: Secondary | ICD-10-CM

## 2020-03-06 NOTE — Telephone Encounter (Signed)
Pt has OV tomorrow for [facility]. Will refill at appt.

## 2020-03-07 ENCOUNTER — Ambulatory Visit: Payer: BC Managed Care – PPO | Admitting: Family Medicine

## 2020-03-07 ENCOUNTER — Other Ambulatory Visit: Payer: Self-pay

## 2020-03-07 ENCOUNTER — Encounter: Payer: Self-pay | Admitting: Family Medicine

## 2020-03-07 VITALS — BP 134/84 | HR 62 | Temp 97.5°F | Resp 17 | Ht 73.0 in | Wt 235.0 lb

## 2020-03-07 DIAGNOSIS — Z23 Encounter for immunization: Secondary | ICD-10-CM | POA: Diagnosis not present

## 2020-03-07 DIAGNOSIS — I4821 Permanent atrial fibrillation: Secondary | ICD-10-CM | POA: Diagnosis not present

## 2020-03-07 DIAGNOSIS — Z9989 Dependence on other enabling machines and devices: Secondary | ICD-10-CM | POA: Diagnosis not present

## 2020-03-07 DIAGNOSIS — F419 Anxiety disorder, unspecified: Secondary | ICD-10-CM | POA: Diagnosis not present

## 2020-03-07 DIAGNOSIS — G4733 Obstructive sleep apnea (adult) (pediatric): Secondary | ICD-10-CM | POA: Diagnosis not present

## 2020-03-07 MED ORDER — ESCITALOPRAM OXALATE 5 MG PO TABS: 5.0000 mg | ORAL_TABLET | Freq: Every day | ORAL | 1 refills | Status: DC

## 2020-03-07 NOTE — Progress Notes (Signed)
VIRTUAL VISIT VIA VIDEO  I connected with Johnathan [name] on 03/07/20 at  3:30 PM EST by a video enabled telemedicine application and verified that I am speaking with the correct person using two identifiers. Location patient: Home Location provider: [facility], Office Persons participating in the virtual visit: Patient, Dr. Raoul Pitch and R.Baker, LPN  I discussed the limitations of evaluation and management by telemedicine and the availability of in person appointments. The patient expressed understanding and agreed to proceed.    Patient ID: [name], male  DOB: 06-09-1956, 64 y.o.   MRN: [id number] Patient Care Team    Relationship Specialty Notifications Start End  Ma Hillock, DO PCP - General Family Medicine  04/15/17   Lorretta Harp, MD PCP - Cardiology Cardiology  12/21/19   Con Memos PCP - Pulmonology Family Medicine  12/21/19   Lorretta Harp, MD Consulting Physician Cardiology  03/22/18   Silverio Decamp, MD Consulting Physician Family Medicine  03/22/18   Irene Shipper, MD Consulting Physician Gastroenterology  07/19/19     Chief Complaint  Patient presents with  . Anxiety    Pt needs refills on medication     Subjective: Johnathan [name] is a 64 y.o.  male present for [facility] follow up  Anxiety: Pt feels well controlled on lexapro 5 mg qd.  immunization: shingrix # 2 due today. Pt had his COVID vaccines > 2 weeks ago.     Depression screen [facility] 2/9 07/19/2019 12/20/2018 03/22/2018 08/07/2017 04/15/2017  Decreased Interest 0 0 0 0 0  Down, Depressed, Hopeless 0 0 0 0 0  PHQ - 2 Score 0 0 0 0 0  Altered sleeping 0 0 - 0 -  Tired, decreased energy 0 0 - 0 -  Change in appetite 0 0 - 0 -  Feeling bad or failure about yourself  0 0 - 0 -  Trouble concentrating 0 0 - 0 -  Moving slowly or fidgety/restless 0 0 - 0 -  Suicidal thoughts 0 0 - 0 -  PHQ-9 Score 0 0 - 0 -   GAD 7 : Generalized Anxiety Score 03/07/2020 07/19/2019 12/20/2018 08/07/2017    Nervous, Anxious, on Edge 0 0 0 0  Control/stop worrying 0 0 0 0  Worry too much - different things 0 0 0 0  Trouble relaxing 0 0 0 0  Restless 0 0 0 0  Easily annoyed or irritable 0 0 0 0  Afraid - awful might happen 0 0 0 0  Total GAD 7 Score 0 0 0 0  Anxiety Difficulty Not difficult at all - - -       Fall Risk  07/19/2019 03/22/2018 04/15/2017  Falls in the past year? 0 No No  Follow up Falls evaluation completed - -   Immunization History  Administered Date(s) Administered  . Influenza Split 10/21/2017  . Influenza,inj,Quad PF,6+ Mos 12/20/2018, 08/26/2019  . Tdap 03/22/2018  . Zoster Recombinat (Shingrix) 07/26/2019    No exam data present  Past Medical History:  Diagnosis Date  . Arthritis    hands and feet  . Elevated LFTs   . Hearing aid worn 2021   right  . Hyperlipidemia   . Hypertension    pt denies  . Persistent atrial fibrillation ([place])   . RLS (restless legs syndrome)   . Sleep apnea    CPAP   Allergies  Allergen Reactions  . Sulfa Antibiotics     Unknown  reaction in childhood   Past Surgical History:  Procedure Laterality Date  . ATRIAL FIBRILLATION ABLATION  2015  . COLONOSCOPY    . FOOT SURGERY Left   . HERNIA REPAIR    . LUMBAR Esko SURGERY  2012   L3-L4, No hardware   Family History  Problem Relation Age of Onset  . Diabetes Mother   . Diabetes Brother   . Early death Brother   . Heart disease Brother   . Colon cancer Neg Hx   . Esophageal cancer Neg Hx   . Rectal cancer Neg Hx   . Stomach cancer Neg Hx   . Liver disease Neg Hx   . Pancreatic cancer Neg Hx    Social History   Social History Narrative   Married to [place]. 2 children [place].    BS degree.    Wear seatbelt. Wears a bicycle helmet. Smoke detector in the home.   Firearms in the home.   Feels safe in  Relationships.   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 If you have any questions, ask your nurse or doctor.        atorvastatin 40 MG tablet Commonly known as: LIPITOR Take 1 tablet (40 mg total) by mouth daily. Patient needs follow up/CPE for further refills.   dofetilide 500 MCG capsule Commonly known as: TIKOSYN Take 1 capsule (500 mcg total) by mouth 2 (two) times daily. NEEDS APPOINTMENT FOR FUTURE REFILLS   escitalopram 5 MG tablet Commonly known as: LEXAPRO Take 1 tablet (5 mg total) by mouth daily. Needs office visit prior to any additional refills.       All past medical history, surgical history, allergies, family history, immunizations andmedications were updated in the EMR today and reviewed under the history and medication portions of their EMR.    No results found for this or any previous visit (from the past 2160 hour(s)).  No results found.   ROS: 14 pt review of systems performed and negative (unless mentioned in an HPI)  Objective: BP 134/84 (BP Location: Right Arm, Patient Position: Sitting, Cuff Size: Normal)   Pulse 62   Temp (!) 97.5 F (36.4 C) (Temporal)   Resp 17   Ht 6\' 1"  (1.854 m)   Wt 235 lb (106.6 kg)   SpO2 97%   BMI 31.00 kg/m  Gen: Afebrile. No acute distress.  HENT: AT. .  Eyes:Pupils Equal Round Reactive to light, Extraocular movements intact,  Conjunctiva without redness, discharge or icterus CV: RRR no murmur, no edema Chest: CTAB, no wheeze or crackles Neuro:  Normal gait. PERLA. EOMi. Alert. Oriented x3  Psych: Normal affect, dress and demeanor. Normal speech. Normal thought content and judgment.    Assessment/plan: Johnathan [name] is a 64 y.o. male present for cpe Permanent atrial fibrillation (HCC)/Pure hypercholesterolemia/obesity  Stable. >prescribed Tikosyn by cardiology Established w/ cards Dr. Gwenlyn Found.  Continue routine follow ups. continue daily asa 81.  Diet and exercise modifications discussed.   Anxiety - stable - continuelexapro 5 mg QD - follow up every 6 mos.   OSA on CPAP - follows with  Pulmonology> recent  new CPAP machine   Return in about 20 weeks (around 07/25/2020) for CPE (30 min).  Orders Placed This Encounter  Procedures  . Varicella-zoster vaccine IM   Meds ordered this encounter  Medications  . escitalopram (LEXAPRO) 5 MG tablet    Sig: Take 1 tablet (5 mg total) by mouth daily. Needs office visit prior to any additional refills.    Dispense:  90 tablet    Refill:  1   Referral Orders  No referral(s) requested today    Note is dictated utilizing voice recognition software. Although note has been proof read prior to signing, occasional typographical errors still can be missed. If any questions arise, please do not hesitate to call for verification.  Electronically signed by: Howard Pouch, DO [place]

## 2020-03-07 NOTE — Patient Instructions (Addendum)
2nd shingrix completed today.   Refills on meds completed.   Next visit End of July for physical with fasting labs.

## 2020-03-09 DIAGNOSIS — M25561 Pain in right knee: Secondary | ICD-10-CM | POA: Diagnosis not present

## 2020-03-14 ENCOUNTER — Telehealth: Payer: Self-pay | Admitting: *Deleted

## 2020-03-14 NOTE — Telephone Encounter (Signed)
   [facility] Pre-operative Risk Assessment    Request for surgical clearance:  1. What type of surgery is being performed? Right prepatella bursectomy   2. When is this surgery scheduled? TBD   3. What type of clearance is required (medical clearance vs. Pharmacy clearance to hold med vs. Both)? medical  4. Are there any medications that need to be held prior to surgery and how long? N/[name]   5. Practice name and name of physician performing surgery? [facility] Dr. Griffin Basil   6. What is your office phone number [phone]    7.   What is your office fax number [phone] attn: Sherri  8.   Anesthesia type (None, local, MAC, general) ?    Johnathan [name] Johnathan Johnathan [name], Johnathan [name]  _________________________________________________________________

## 2020-03-14 NOTE — Telephone Encounter (Signed)
Left voice mail to call back 

## 2020-03-15 SURGERY — Surgical Case
Anesthesia: *Unknown

## 2020-03-20 NOTE — Telephone Encounter (Signed)
   Primary Cardiologist: Quay Burow, MD  Chart reviewed as part of pre-operative protocol coverage. Patient was last seen by Coletta Memos, NP, on 12/21/2019 at which time patient was stable from a cardiac standpoint. Patient was contacted today for further pre-op evaluation and reports doing well from a cardiac standpoint since last office visit. He denies any worsening palpitations with his atrial fibrillation. No chest pain, shortness of breath, orthopnea, PND, edema, lightheadedness, dizziness, or syncope. He is able to complete >4.0 METS without any problems.  Given past medical history and time since last visit, based on ACC/AHA guidelines, Johnathan [name] would be at acceptable risk for the planned procedure without further cardiovascular testing.   Patient on Tikosyn for atrial fibrillation. This should be continued throughout peri-operative period.   I will route this recommendation to the requesting party via Epic fax function and remove from pre-op pool.  Please call with questions.  Darreld Mclean, PA-C 03/20/2020, 11:16 AM

## 2020-03-23 DIAGNOSIS — G4733 Obstructive sleep apnea (adult) (pediatric): Secondary | ICD-10-CM | POA: Diagnosis not present

## 2020-04-11 ENCOUNTER — Ambulatory Visit: Payer: BC Managed Care – PPO | Admitting: Cardiovascular Disease

## 2020-04-23 DIAGNOSIS — G4733 Obstructive sleep apnea (adult) (pediatric): Secondary | ICD-10-CM | POA: Diagnosis not present

## 2020-05-02 ENCOUNTER — Telehealth: Payer: Self-pay | Admitting: Cardiovascular Disease

## 2020-05-02 NOTE — Telephone Encounter (Signed)
Follow Up:     Pt is returning your call. 

## 2020-05-02 NOTE — Telephone Encounter (Signed)
The patient stated that he was prescribed Methylprednisolone for 6 days (tapering down) and wants to know if this is okay with Tikosyn.   Per pharmd, this is okay to take with the Tikosyn. The patient has been made aware.

## 2020-05-02 NOTE — Telephone Encounter (Signed)
Duplicate encounter

## 2020-05-02 NOTE — Telephone Encounter (Signed)
The patient is currently in [facility] He stated he has a bad elbow that he has had to have antibiotics for previously.  He is going to an Urgent Care at 11:30 and would like to know if he has any restrictions pertaining to antibiotic and his Tikosyn.   Per pharmD, the patient should not take Azithromax and Clarithromycin. He has verbalized his understanding.

## 2020-05-02 NOTE — Telephone Encounter (Signed)
   Pt c/o medication issue:  1. Name of Medication: antibiotics  2. How are you currently taking this medication (dosage and times per day)?   3. Are you having a reaction (difficulty breathing--STAT)?   4. What is your medication issue? Pt would like to know what kind of antibiotic he can take with his heart medicines  Please advise

## 2020-05-02 NOTE — Telephone Encounter (Signed)
Patient states he is calling to follow up in regards to antibiotic inquiry. Please call to discuss.

## 2020-05-02 NOTE — Telephone Encounter (Signed)
Left a message for the patient to call back.  

## 2020-05-23 DIAGNOSIS — G4733 Obstructive sleep apnea (adult) (pediatric): Secondary | ICD-10-CM | POA: Diagnosis not present

## 2020-07-20 ENCOUNTER — Telehealth: Payer: Self-pay | Admitting: Student

## 2020-07-20 NOTE — Telephone Encounter (Signed)
   Patient called after hours stated that he had gone into atrial fibrillation. Patient has known atrial fibrillation and is on Tikosyn. He states he went back into atrial fibrillation about 30 minutes ago. He is on a camping trip about 1.5 away from [place] and just got done unloading everything. He states he usually has to be cardioverted back into sinus rhythm when this happens. He has only spontaneously converted twice and this took about 1-2 hours. He states he is always aware when he goes into atrial fibrillation but denies any lightheadedness, dizziness, chest pain, or shortness of breath. He does not have a way to measure his BP but states heart rates are in the 120's. He did miss his morning dose of Tikoysn but he already took his evening dose. He would like to wait a little while longer and then drive back to [place] if he does not convert back to sinus rhythm on his own. Given patient is not having any concerning symptoms, I advised that it would be OK to wait 30-45 minutes longer to see if he converts on his own. However, if he doesn't would recommend going to the ED. [facility] is closer to where he is and he has gone there to be cardioverted before. I would recommend he go to [facility] since that is closer if needed rather than driving at least 1.5 hours to [place]. Emphasized that he should not drive himself. He voice understanding and agreed.   Darreld Mclean, PA-C 07/20/2020 8:12 PM

## 2020-08-18 ENCOUNTER — Other Ambulatory Visit: Payer: Self-pay | Admitting: Family Medicine

## 2020-08-18 DIAGNOSIS — F419 Anxiety disorder, unspecified: Secondary | ICD-10-CM

## 2020-08-20 NOTE — Telephone Encounter (Signed)
RF request for escitalopram 5mg  LOV:03/07/20 Next ov: 08/29/20 Last written:03/07/20 (90,1)  Please advise. Pt said he will likely run out of meds before scheduled appt but he will split med in half to make it to his appt.

## 2020-08-24 ENCOUNTER — Other Ambulatory Visit: Payer: Self-pay | Admitting: Cardiovascular Disease

## 2020-08-24 MED ORDER — DOFETILIDE 500 MCG PO CAPS: 500.0000 ug | ORAL_CAPSULE | Freq: Two times a day (BID) | ORAL | 8 refills | Status: DC

## 2020-08-24 NOTE — Telephone Encounter (Signed)
*  STAT* If patient is at the pharmacy, call can be transferred to refill team.   1. Which medications need to be refilled? (please list name of each medication and dose if known) dofetilide (TIKOSYN) 500 MCG capsule   2. Which pharmacy/location (including street and city if local pharmacy) is medication to be sent to? [id number] [place], [place] - [address]  3. Do they need a 30 day or 90 day supply? [place]

## 2020-08-28 ENCOUNTER — Other Ambulatory Visit: Payer: Self-pay

## 2020-08-28 ENCOUNTER — Ambulatory Visit (INDEPENDENT_AMBULATORY_CARE_PROVIDER_SITE_OTHER): Payer: 59 | Admitting: Cardiovascular Disease

## 2020-08-28 ENCOUNTER — Encounter: Payer: Self-pay | Admitting: Cardiovascular Disease

## 2020-08-28 DIAGNOSIS — G4733 Obstructive sleep apnea (adult) (pediatric): Secondary | ICD-10-CM | POA: Diagnosis not present

## 2020-08-28 DIAGNOSIS — Z9989 Dependence on other enabling machines and devices: Secondary | ICD-10-CM

## 2020-08-28 DIAGNOSIS — I4821 Permanent atrial fibrillation: Secondary | ICD-10-CM

## 2020-08-28 DIAGNOSIS — E782 Mixed hyperlipidemia: Secondary | ICD-10-CM

## 2020-08-28 MED ORDER — METOPROLOL TARTRATE 25 MG PO TABS: 25.0000 mg | ORAL_TABLET | ORAL | 3 refills | Status: DC | PRN

## 2020-08-28 NOTE — Progress Notes (Signed)
08/28/2020 Johnathan [name]   21-Jan-1956  [id number]  Primary Physician Ma Hillock, DO Primary Cardiologist: Lorretta Harp MD Renae Gloss  HPI:  Johnathan [name] is a 64 y.o.  mildly overweight married Caucasian male father of 2 children, grandfather of 1 grandchild [employer] in [place].   He was referred by Dr. Trude Mcburney.O. establish her cardiovascular practice because his prior A. fib history.  I last saw him in the office 11/17/2018.[name]  His cardiovascular risk factor profile is notable for treated hyperlipidemia and family history. Brother died of myocardial infarction at age 68. He's never had a heart attack or stroke. He denies chest pain or shortness of breath. He does have obstructive sleep apnea on CPAP which she wears intermittently. He had A. fib ablation at [facility] 8  years ago by Dr. Tally Due. He currently is on Tikosyn. He is not on oral anticoagulation. He said 4-5 DC cardioversions in the past since his ablation, the last one being 4  years ago at [facility] by Dr. Dwyane Dee.  Since I saw him 2 years ago he is remained stable.  He said 3 episodes of breakthrough PAF most of which have been rather transient.  His last episode 1 month ago lasted 3 to 4 hours and occurred on a camping trip point which he attributes to dehydration.  He remains on Tikosyn.  He denies chest pain or shortness of breath.   Current Meds  Medication Sig  . atorvastatin (LIPITOR) 40 MG tablet Take 1 tablet (40 mg total) by mouth daily. Patient needs follow up/CPE for further refills.  . dofetilide (TIKOSYN) 500 MCG capsule Take 1 capsule (500 mcg total) by mouth 2 (two) times daily. NEEDS APPOINTMENT FOR FUTURE REFILLS  . escitalopram (LEXAPRO) 5 MG tablet TAKE 1 TABLET (5 MG TOTAL) BY MOUTH DAILY. NEEDS OFFICE VISIT PRIOR TO ANY ADDITIONAL REFILLS.     Allergies  Allergen Reactions  . Sulfa Antibiotics     Unknown reaction in  childhood    Social History   Socioeconomic History  . Marital status: Married    Spouse name: Johnathan [name]  . Number of children: 2  . Years of education: 33  . Highest education level: Not on file  Occupational History  . Not on file  Tobacco Use  . Smoking status: Never Smoker  . Smokeless tobacco: Former Systems developer    Types: Secondary school teacher  . Vaping Use: Never used  Substance and Sexual Activity  . Alcohol use: Yes    Alcohol/week: 10.0 standard drinks    Types: 10 Cans of beer per week  . Drug use: No  . Sexual activity: Yes    Partners: Female    Comment: Married  Other Topics Concern  . Not on file  Social History Narrative   Married to [place]. 2 children [place].    BS degree.    Wear seatbelt. Wears a bicycle helmet. Smoke detector in the home.   Firearms in the home.   Feels safe in  Relationships.   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 Difficulty of Paying Living Expenses: Not on file  Food Insecurity:   . Worried About Charity fundraiser in the Last Year: Not on file  .  Ran Out of Food in the Last Year: Not on file  Transportation Needs:   . Lack of Transportation (Medical): Not on file  . Lack of Transportation (Non-Medical): Not on file  Physical Activity:   . Days of Exercise per Week: Not on file  . Minutes of Exercise per Session: Not on file  Stress:   . Feeling of Stress : Not on file  Social Connections:   . Frequency of Communication with Friends and Family: Not on file  . Frequency of Social Gatherings with Friends and Family: Not on file  . Attends Religious Services: Not on file  . Active Member of Clubs or Organizations: Not on file  . Attends Archivist Meetings: Not on file  . Marital Status: Not on file  Intimate Partner Violence:   . Fear of Current or Ex-Partner:  Not on file  . Emotionally Abused: Not on file  . Physically Abused: Not on file  . Sexually Abused: Not on file     Review of Systems: General: negative for chills, fever, night sweats or weight changes.  Cardiovascular: negative for chest pain, dyspnea on exertion, edema, orthopnea, palpitations, paroxysmal nocturnal dyspnea or shortness of breath Dermatological: negative for rash Respiratory: negative for cough or wheezing Urologic: negative for hematuria Abdominal: negative for nausea, vomiting, diarrhea, bright red blood per rectum, melena, or hematemesis Neurologic: negative for visual changes, syncope, or dizziness All other systems reviewed and are otherwise negative except as noted above.    Blood pressure (!) 142/84, pulse (!) 56, height 6\' 1"  (1.854 m), weight 229 lb 12.8 oz (104.2 kg), SpO2 98 %.  General appearance: alert and no distress Neck: no adenopathy, no carotid bruit, no JVD, supple, symmetrical, trachea midline and thyroid not enlarged, symmetric, no tenderness/mass/nodules Lungs: clear to auscultation bilaterally Heart: regular rate and rhythm, S1, S2 normal, no murmur, click, rub or gallop Extremities: extremities normal, atraumatic, no cyanosis or edema Pulses: 2+ and symmetric Skin: Skin color, texture, turgor normal. No rashes or lesions Neurologic: Alert and oriented X 3, normal strength and tone. Normal symmetric reflexes. Normal coordination and gait  EKG sinus bradycardia 56 without ST or T wave changes.  I personally reviewed this EKG.  ASSESSMENT AND PLAN:   Permanent atrial fibrillation (HCC) History of paroxysmal A. fib status post A. fib ablation by Dr. Tally Due at [facility] approximately 10 years ago.  Is maintained sinus rhythm since.  He gets occasional breakthrough episodes most recently about a month ago when he was camping in the woods.  The episode lasted 3 to 4 hours and resolve spontaneously.  He attributes this to dehydration.  Also had  breakthrough episode secondary to alcohol use in the past.  These have not changed in frequency or severity since I saw him 2 years ago.  Hyperlipidemia Patient hyperlipidemia on statin therapy with lipid profile performed 07/18/2019 revealing total cholesterol 179, LDL 112 and HDL 48.  OSA on CPAP History of obstructive sleep apnea on CPAP followed by Dr. Dickie La MD [facility] 08/28/2020 11:01 AM

## 2020-08-28 NOTE — Assessment & Plan Note (Signed)
History of obstructive sleep apnea on CPAP followed by Dr. Kelly 

## 2020-08-28 NOTE — Assessment & Plan Note (Signed)
History of paroxysmal A. fib status post A. fib ablation by Dr. Tally Due at [facility] approximately 10 years ago.  Is maintained sinus rhythm since.  He gets occasional breakthrough episodes most recently about a month ago when he was camping in the woods.  The episode lasted 3 to 4 hours and resolve spontaneously.  He attributes this to dehydration.  Also had breakthrough episode secondary to alcohol use in the past.  These have not changed in frequency or severity since I saw him 2 years ago.

## 2020-08-28 NOTE — Assessment & Plan Note (Signed)
Patient hyperlipidemia on statin therapy with lipid profile performed 07/18/2019 revealing total cholesterol 179, LDL 112 and HDL 48.

## 2020-08-28 NOTE — Patient Instructions (Signed)
Medication Instructions:  Take Metoprolol 25 mg as needed for high heart rate   *If you need a refill on your cardiac medications before your next appointment, please call your pharmacy*   Follow-Up: At [facility], you and your health needs are our priority.  As part of our continuing mission to provide you with exceptional heart care, we have created designated Provider Care Teams.  These Care Teams include your primary Cardiologist (physician) and Advanced Practice Providers (APPs -  Physician Assistants and Nurse Practitioners) who all work together to provide you with the care you need, when you need it.  We recommend signing up for the patient portal called "MyChart".  Sign up information is provided on this After Visit Summary.  MyChart is used to connect with patients for Virtual Visits (Telemedicine).  Patients are able to view lab/test results, encounter notes, upcoming appointments, etc.  Non-urgent messages can be sent to your provider as well.   To learn more about what you can do with MyChart, go to [email or web address].    Your next appointment:   12 month(s)  The format for your next appointment:   In Person  Provider:   Quay Burow, MD

## 2020-08-29 ENCOUNTER — Telehealth (INDEPENDENT_AMBULATORY_CARE_PROVIDER_SITE_OTHER): Payer: 59 | Admitting: Family Medicine

## 2020-08-29 ENCOUNTER — Encounter: Payer: Self-pay | Admitting: Family Medicine

## 2020-08-29 VITALS — BP 144/82 | Temp 97.0°F | Ht 73.0 in | Wt 229.0 lb

## 2020-08-29 DIAGNOSIS — E7800 Pure hypercholesterolemia, unspecified: Secondary | ICD-10-CM

## 2020-08-29 DIAGNOSIS — I4821 Permanent atrial fibrillation: Secondary | ICD-10-CM | POA: Diagnosis not present

## 2020-08-29 DIAGNOSIS — F419 Anxiety disorder, unspecified: Secondary | ICD-10-CM | POA: Diagnosis not present

## 2020-08-29 MED ORDER — ESCITALOPRAM OXALATE 5 MG PO TABS: 5.0000 mg | ORAL_TABLET | Freq: Every day | ORAL | 1 refills | Status: DC

## 2020-08-29 MED ORDER — ATORVASTATIN CALCIUM 40 MG PO TABS: 40.0000 mg | ORAL_TABLET | Freq: Every day | ORAL | 3 refills | Status: DC

## 2020-08-29 NOTE — Addendum Note (Signed)
Addended by: Howard Pouch A on: 08/29/2020 10:31 AM   Modules accepted: Level of Service

## 2020-08-29 NOTE — Patient Instructions (Signed)
Follow up for physical in 3-5.5 months.

## 2020-08-29 NOTE — Progress Notes (Signed)
VIRTUAL VISIT VIA VIDEO  I connected with Johnathan [name] on 08/29/20 at 10:00 AM EDT by a video enabled telemedicine application and verified that I am speaking with the correct person using two identifiers. Location patient: Home Location provider: [facility], Office Persons participating in the virtual visit: Patient, V.White, CMA  I discussed the limitations of evaluation and management by telemedicine and the availability of in person appointments. The patient expressed understanding and agreed to proceed.  Interactive audio and video telecommunications were attempted between this provider and patient, however failed, due to patient having technical difficulties OR patient did not have access to video capability. We continued and completed visit with audio only.    Patient ID: Johnathan [name], male  DOB: October 14, 1956, 64 y.o.   MRN: [id number] Patient Care Team    Relationship Specialty Notifications Start End  Ma Hillock, DO PCP - General Family Medicine  04/15/17   Lorretta Harp, MD PCP - Cardiology Cardiology  12/21/19   Con Memos PCP - Pulmonology Family Medicine  12/21/19   Lorretta Harp, MD Consulting Physician Cardiology  03/22/18   Irene Shipper, MD Consulting Physician Gastroenterology  07/19/19   Dene Gentry, MD Consulting Physician Sports Medicine  03/07/20   Hiram Gash, MD Consulting Physician Orthopedic Surgery  03/07/20   Troy Sine, MD Consulting Physician Cardiology  04/02/20     Chief Complaint  Patient presents with  . Follow-up    medication refills    Subjective: Johnathan [name] is a 64 y.o.  male present for [facility] follow up  Anxiety: Pt feels well controlled on lexapro 5 mg qd.  HLD: Follows with cardio for a.fib. Compliant with statin. Due for labs.    Depression screen [facility] 2/9 08/29/2020 07/19/2019 12/20/2018 03/22/2018 08/07/2017  Decreased Interest 0 0 0 0 0  Down, Depressed, Hopeless 0 0 0 0 0  PHQ - 2 Score 0 0 0 0 0  Altered  sleeping - 0 0 - 0  Tired, decreased energy - 0 0 - 0  Change in appetite - 0 0 - 0  Feeling bad or failure about yourself  - 0 0 - 0  Trouble concentrating - 0 0 - 0  Moving slowly or fidgety/restless - 0 0 - 0  Suicidal thoughts - 0 0 - 0  PHQ-9 Score - 0 0 - 0   GAD 7 : Generalized Anxiety Score 03/07/2020 07/19/2019 12/20/2018 08/07/2017  Nervous, Anxious, on Edge 0 0 0 0  Control/stop worrying 0 0 0 0  Worry too much - different things 0 0 0 0  Trouble relaxing 0 0 0 0  Restless 0 0 0 0  Easily annoyed or irritable 0 0 0 0  Afraid - awful might happen 0 0 0 0  Total GAD 7 Score 0 0 0 0  Anxiety Difficulty Not difficult at all - - -       Fall Risk  08/29/2020 07/19/2019 03/22/2018 04/15/2017  Falls in the past year? 0 0 No No  Number falls in past yr: 0 - - -  Injury with Fall? 0 - - -  Follow up Falls evaluation completed Falls evaluation completed - -   Immunization History  Administered Date(s) Administered  . Influenza Split 10/21/2017  . Influenza,inj,Quad PF,6+ Mos 12/20/2018, 08/26/2019  . PFIZER SARS-COV-2 Vaccination 01/23/2020, 02/18/2020  . Tdap 03/22/2018  . Zoster Recombinat (Shingrix) 07/26/2019, 03/07/2020    No exam data present  Past Medical History:  Diagnosis Date  . Arthritis    hands and feet  . Elevated LFTs   . Hearing aid worn 2021   right  . Hyperlipidemia   . Hypertension    pt denies  . Persistent atrial fibrillation ([place])   . RLS (restless legs syndrome)   . Sleep apnea    CPAP   Allergies  Allergen Reactions  . Sulfa Antibiotics     Unknown reaction in childhood   Past Surgical History:  Procedure Laterality Date  . ATRIAL FIBRILLATION ABLATION  2015  . COLONOSCOPY    . FOOT SURGERY Left   . HERNIA REPAIR    . LUMBAR [place] SURGERY  2012   L3-L4, No hardware   Family History  Problem Relation Age of Onset  . Diabetes Mother   . Diabetes Brother   . Early death Brother   . Heart disease Brother   . Colon cancer Neg Hx     . Esophageal cancer Neg Hx   . Rectal cancer Neg Hx   . Stomach cancer Neg Hx   . Liver disease Neg Hx   . Pancreatic cancer Neg Hx    Social History   Social History Narrative   Married to [place]. 2 children [place].    BS degree.    Wear seatbelt. Wears a bicycle helmet. Smoke detector in the home.   Firearms in the home.   Feels safe in  Relationships.   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 If you have any questions, ask your nurse or doctor.        atorvastatin 40 MG tablet Commonly known as: LIPITOR Take 1 tablet (40 mg total) by mouth daily. What changed: additional instructions Changed by: Howard Pouch, DO   dofetilide 500 MCG capsule Commonly known as: TIKOSYN Take 1 capsule (500 mcg total) by mouth 2 (two) times daily. NEEDS APPOINTMENT FOR FUTURE REFILLS   escitalopram 5 MG tablet Commonly known as: LEXAPRO Take 1 tablet (5 mg total) by mouth daily. What changed: additional instructions Changed by: Howard Pouch, DO   metoprolol tartrate 25 MG tablet Commonly known as: LOPRESSOR Take 1 tablet (25 mg total) by mouth as needed (high heart rate).       All past medical history, surgical history, allergies, family history, immunizations andmedications were updated in the EMR today and reviewed under the history and medication portions of their EMR.    No results found for this or any previous visit (from the past 2160 hour(s)).  No results found.   ROS: 14 pt review of systems performed and negative (unless mentioned in an HPI)  Objective: BP (!) 144/82 Comment: 08/28/2020 cardiologist appt  Temp (!) 97 F (36.1 C) (Temporal) Comment: checks at work  Ht 6\' 1"  (1.854 m)   Wt 229 lb  (103.9 kg)   SpO2 98%   BMI 30.21 kg/m  Gen: Afebrile. No acute distress.  HENT: AT. [place].  Eyes:Pupils Equal Round Reactive to light, Extraocular movements intact,  Conjunctiva without redness, discharge or icterus. Chest: no cough or shortness of breath Neuro: Normal gait. PERLA. EOMi.  Alert. Oriented x3 Psych: Normal affect, dress and demeanor. Normal speech. Normal thought content and judgment.  Assessment/plan: Johnathan [name] is a 64 y.o. male present for cpe Permanent atrial fibrillation (HCC)/Pure hypercholesterolemia/obesity  Stable. >prescribed metoprolol prn and Tikosyn by cardiology Established w/ cards Dr. Gwenlyn Found. Continue routine follow ups. continue daily asa 81.  Continue statin - labs DUE- cbc, bmp, tsh, lipid orders placed for collection week of 9/20 fasting.  Diet and exercise modifications discussed.   Anxiety - Stable.  - continuelexapro 5 mg QD - follow up every  5.5 mos.   OSA on CPAP - follows with  Pulmonology compliant with CPAP   Return in about 25 weeks (around 02/20/2021) for CPE (30 min)/cmc.  Orders Placed This Encounter  Procedures  . CBC  . Basic Metabolic Panel (BMET)  . Lipid panel  . TSH   Meds ordered this encounter  Medications  . escitalopram (LEXAPRO) 5 MG tablet    Sig: Take 1 tablet (5 mg total) by mouth daily.    Dispense:  90 tablet    Refill:  1  . atorvastatin (LIPITOR) 40 MG tablet    Sig: Take 1 tablet (40 mg total) by mouth daily.    Dispense:  90 tablet    Refill:  3   Referral Orders  No referral(s) requested today    Note is dictated utilizing voice recognition software. Although note has been proof read prior to signing, occasional typographical errors still can be missed. If any questions arise, please do not hesitate to call for verification.  Electronically signed by: Howard Pouch, DO [place]

## 2020-09-19 ENCOUNTER — Ambulatory Visit (INDEPENDENT_AMBULATORY_CARE_PROVIDER_SITE_OTHER): Payer: 59

## 2020-09-19 ENCOUNTER — Other Ambulatory Visit: Payer: Self-pay

## 2020-09-19 DIAGNOSIS — I4821 Permanent atrial fibrillation: Secondary | ICD-10-CM

## 2020-09-19 DIAGNOSIS — E7800 Pure hypercholesterolemia, unspecified: Secondary | ICD-10-CM | POA: Diagnosis not present

## 2020-09-19 LAB — BASIC METABOLIC PANEL
BUN: 17 mg/dL (ref 6–23)
CO2: 30 mEq/L (ref 19–32)
Calcium: 9.9 mg/dL (ref 8.4–10.5)
Chloride: 103 mEq/L (ref 96–112)
Creatinine, Ser: 1.03 mg/dL (ref 0.40–1.50)
GFR: 72.58 mL/min (ref >60.00)
Glucose, Bld: 98 mg/dL (ref 70–99)
Potassium: 4.3 mEq/L (ref 3.5–5.1)
Sodium: 139 mEq/L (ref 135–145)

## 2020-09-19 LAB — CBC
HCT: 46.2 % (ref 39.0–52.0)
Hemoglobin: 15.5 g/dL (ref 13.0–17.0)
MCHC: 33.6 g/dL (ref 30.0–36.0)
MCV: 89.6 fl (ref 78.0–100.0)
Platelets: 240 10*3/uL (ref 150.0–400.0)
RBC: 5.16 Mil/uL (ref 4.22–5.81)
RDW: 14.3 % (ref 11.5–15.5)
WBC: 8.5 10*3/uL (ref 4.0–10.5)

## 2020-09-19 LAB — LIPID PANEL
Cholesterol: 174 mg/dL (ref 0–200)
HDL: 49.4 mg/dL (ref >39.00)
LDL Cholesterol: 99 mg/dL (ref 0–99)
NonHDL: 125.05
Total CHOL/HDL Ratio: 4
Triglycerides: 128 mg/dL (ref 0.0–149.0)
VLDL: 25.6 mg/dL (ref 0.0–40.0)

## 2020-09-19 LAB — TSH: TSH: 3.98 u[IU]/mL (ref 0.35–4.50)

## 2020-09-26 IMAGING — DX THORACIC SPINE - 3 VIEWS
3 series · 3 of 3 positions shown · non-contrast
Comparison: None.

CLINICAL DATA: Upper back pain for several weeks.

EXAM:
THORACIC SPINE - 3 VIEWS

[t-spine ap]
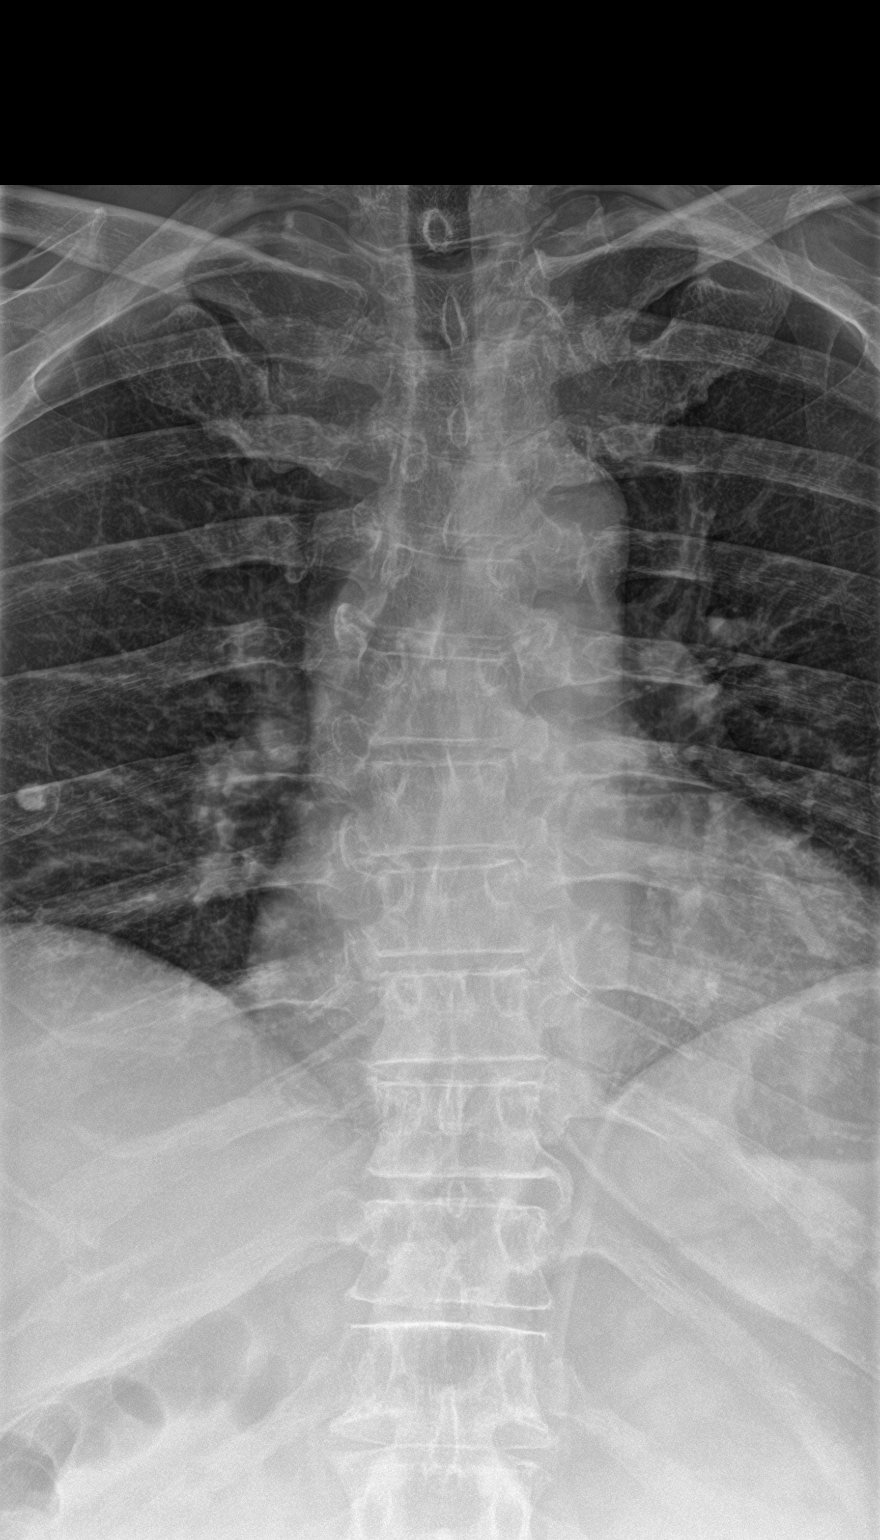

[t-spine lat]
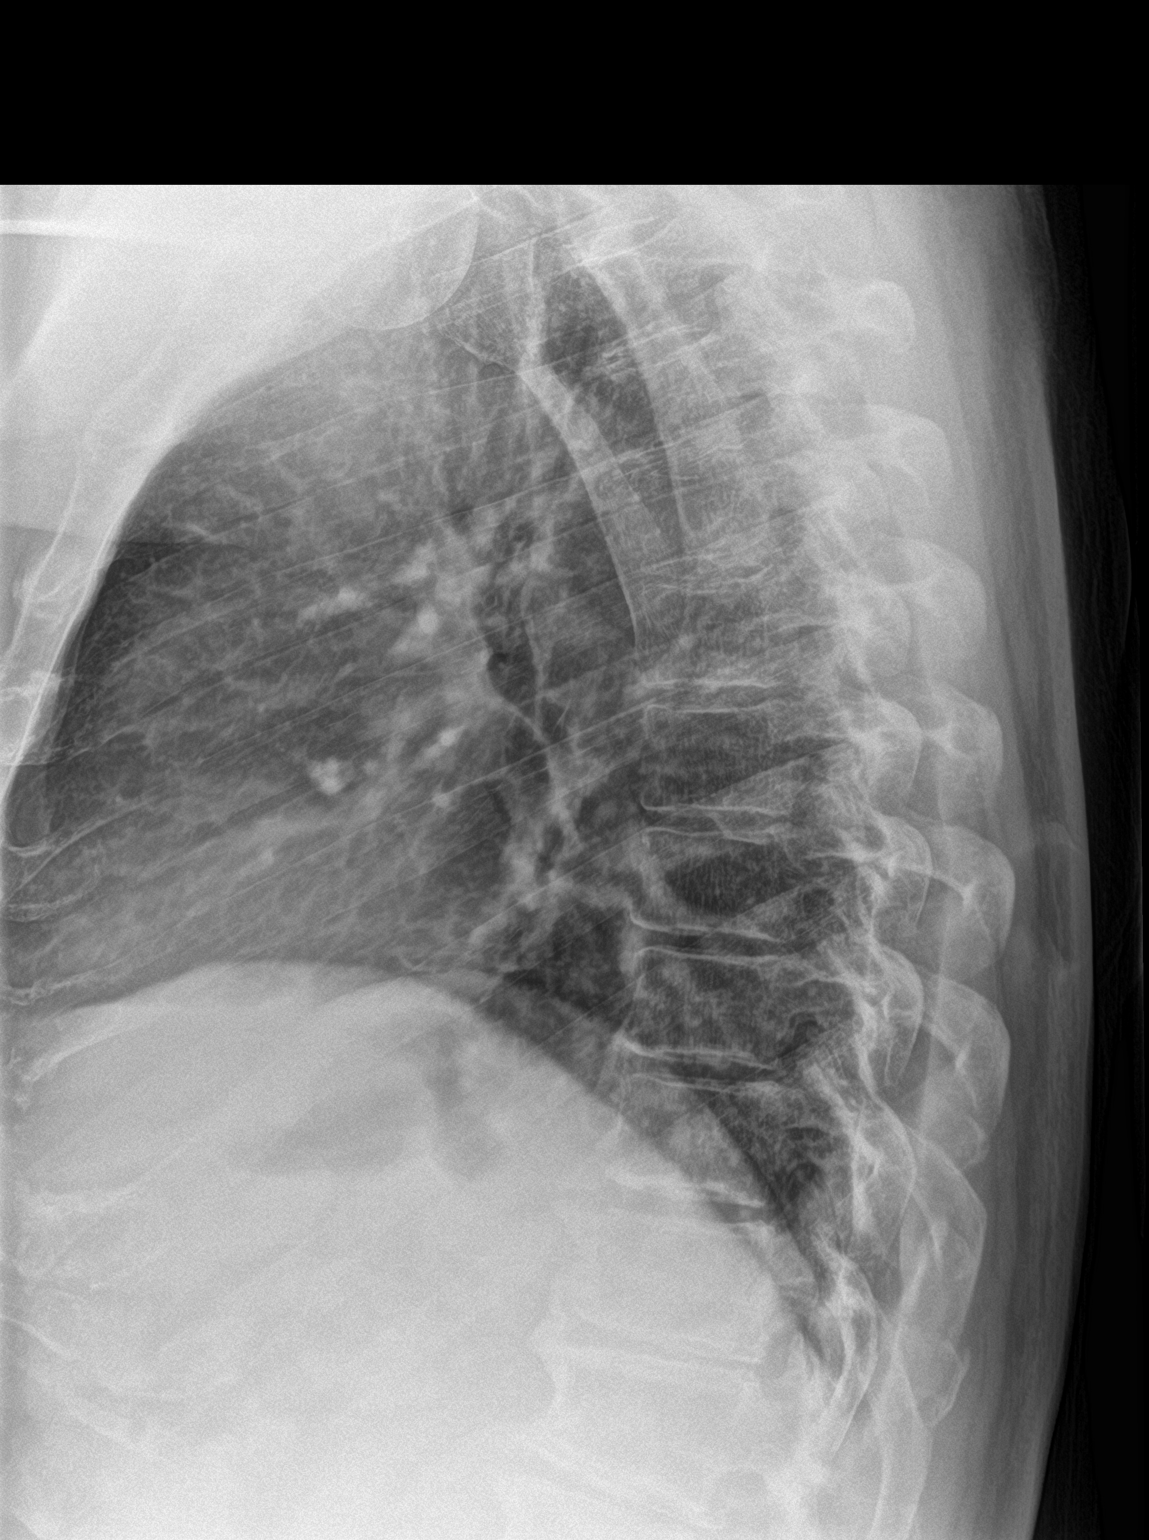

[t-spine swimmers]
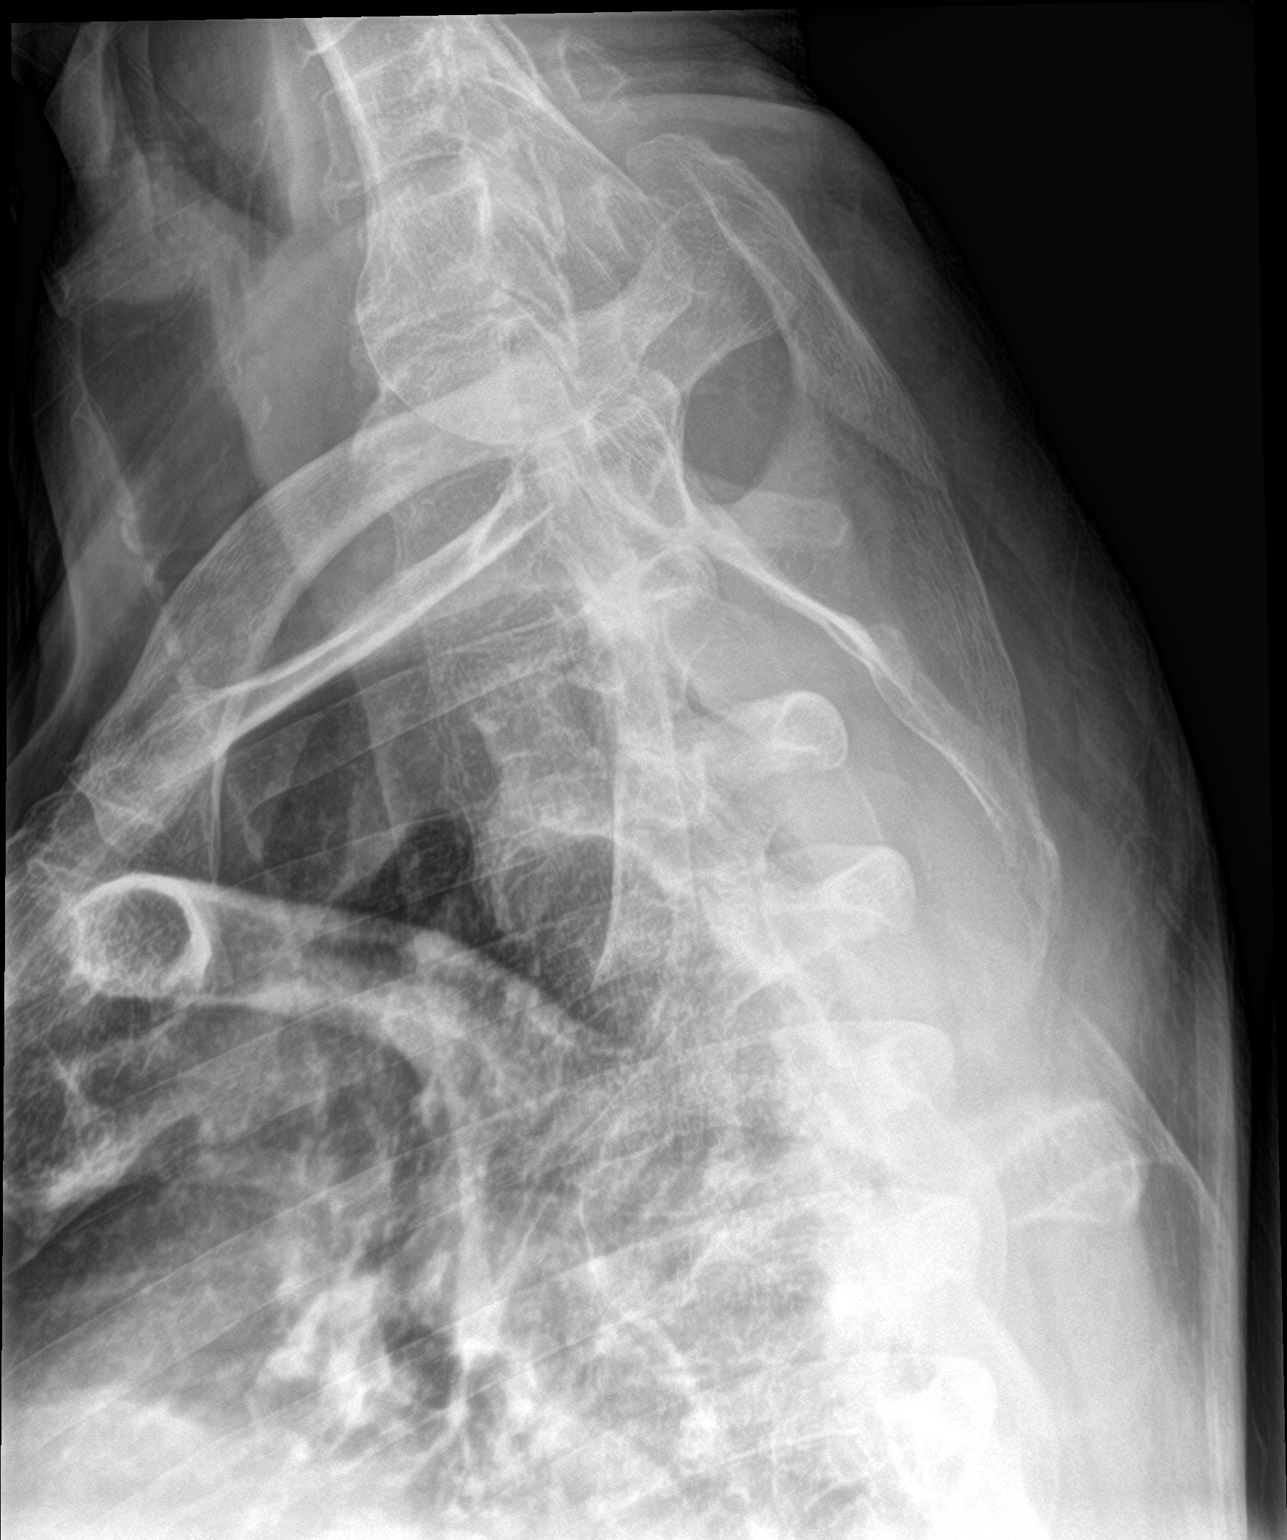

[3 of 3 positions shown; findings below may reference images not displayed]

FINDINGS: There is no evidence of thoracic spine fracture. Alignment is
normal. No other significant bone abnormalities are identified.
IMPRESSION: Negative.

## 2020-12-26 ENCOUNTER — Encounter: Payer: Self-pay | Admitting: Cardiovascular Disease

## 2020-12-26 NOTE — Progress Notes (Signed)
Mr. Gauthier called to report that he is in atrial fibrillation.  He is not sure when it began but noticed that he was winding down for the day that his heart rhythm was regular.  His heart rate was 102 bpm.  He denies angina, dyspnea or syncope/near syncope.  He does not feel particularly bad. Past episodes of atrial fibrillation have been triggered by drinking alcohol or drinking insufficient water throughout the day.  He has not had any alcohol today. He is fairly confident that he took his dofetilide this morning at 9 AM, although he is not 100% sure.  He has not yet taken his evening dose of dofetilide. He is not on chronic anticoagulation but did take a Xarelto 20 mg tablets this evening, as soon as he realized he is in atrial fibrillation.  He has not taken metoprolol yet.  I advised that he should take his evening dose of dofetilide as usual, take metoprolol 25 mg now, drink plenty of water and rest.  It is quite possible that he will spontaneously convert to normal rhythm overnight.  If he is still in atrial fibrillation tomorrow morning, he should call our office.  We may need to arrange an elective outpatient cardioversion.  If at any point he develops angina, severe dyspnea or sensation of fainting/syncope, he should seek emergency medical attention.  At this point, a visit to the emergency room does not appear necessary.

## 2021-03-22 ENCOUNTER — Other Ambulatory Visit: Payer: Self-pay

## 2021-03-22 DIAGNOSIS — F419 Anxiety disorder, unspecified: Secondary | ICD-10-CM

## 2021-03-22 MED ORDER — ESCITALOPRAM OXALATE 5 MG PO TABS: 5.0000 mg | ORAL_TABLET | Freq: Every day | ORAL | 0 refills | Status: DC

## 2021-04-24 ENCOUNTER — Other Ambulatory Visit: Payer: Self-pay | Admitting: Family Medicine

## 2021-04-24 DIAGNOSIS — F419 Anxiety disorder, unspecified: Secondary | ICD-10-CM

## 2021-05-02 ENCOUNTER — Telehealth: Payer: Self-pay

## 2021-05-02 DIAGNOSIS — F419 Anxiety disorder, unspecified: Secondary | ICD-10-CM

## 2021-05-02 MED ORDER — ESCITALOPRAM OXALATE 5 MG PO TABS: 5.0000 mg | ORAL_TABLET | Freq: Every day | ORAL | 0 refills | Status: DC

## 2021-05-02 NOTE — Progress Notes (Signed)
Chart reviewed by Dr. Sabra Heck. Ok to continue with surgery at [facility] as scheduled. States cardiac clearance is not needed.

## 2021-05-02 NOTE — Telephone Encounter (Signed)
Rx sent must keep upcoming appt

## 2021-05-02 NOTE — Telephone Encounter (Signed)
Patient refill request.  He states he has been without meds for 3 days now. Scheduled CPE appt for 6/1 with Dr. Raoul Pitch.     escitalopram (LEXAPRO) 5 MG tablet [[id number]]    [facility]

## 2021-05-03 ENCOUNTER — Encounter (HOSPITAL_BASED_OUTPATIENT_CLINIC_OR_DEPARTMENT_OTHER): Payer: Self-pay | Admitting: Orthopaedic Surgery

## 2021-05-03 ENCOUNTER — Other Ambulatory Visit: Payer: Self-pay

## 2021-05-06 ENCOUNTER — Other Ambulatory Visit: Payer: Self-pay | Admitting: Orthopaedic Surgery

## 2021-05-06 ENCOUNTER — Telehealth: Payer: Self-pay

## 2021-05-06 ENCOUNTER — Other Ambulatory Visit (HOSPITAL_COMMUNITY): Payer: PRIVATE HEALTH INSURANCE

## 2021-05-06 NOTE — Telephone Encounter (Signed)
   [facility] Pre-operative Risk Assessment    Patient Name: Johnathan [name]  DOB: 1956/06/15  MRN: [id number]   HEARTCARE STAFF: - Please ensure there is not already an duplicate clearance open for this procedure. - Under Visit Info/Reason for Call, type in Other and utilize the format Clearance MM/DD/YY or Clearance TBD. Do not use dashes or single digits. - If request is for dental extraction, please clarify the # of teeth to be extracted.  Request for surgical clearance:  1. What type of surgery is being performed? excision of right toe mass, possible excision of skin  2. When is this surgery scheduled? 05/09/21  3. What type of clearance is required (medical clearance vs. Pharmacy clearance to hold med vs. Both)? medical  4. Are there any medications that need to be held prior to surgery and how long?   5. Practice name and name of physician performing surgery? [facility], Dr. Melony Overly  6. What is the office phone number? [phone]   7.   What is the office fax number? [phone]  8.   Anesthesia type (None, local, MAC, general) ? Choice   Beatrix Fetters 05/06/2021, 5:36 PM  _________________________________________________________________   (provider comments below)

## 2021-05-07 ENCOUNTER — Encounter (HOSPITAL_BASED_OUTPATIENT_CLINIC_OR_DEPARTMENT_OTHER)
Admission: RE | Admit: 2021-05-07 | Discharge: 2021-05-07 | Disposition: A | Discharge status: Home / Self Care | Payer: 59 | Source: Ambulatory Visit | Attending: Orthopaedic Surgery | Admitting: Orthopaedic Surgery

## 2021-05-07 ENCOUNTER — Other Ambulatory Visit (HOSPITAL_COMMUNITY)
Admission: RE | Admit: 2021-05-07 | Discharge: 2021-05-07 | Disposition: A | Discharge status: Home / Self Care | Payer: 59 | Source: Ambulatory Visit | Attending: Orthopaedic Surgery | Admitting: Orthopaedic Surgery

## 2021-05-07 DIAGNOSIS — Z20822 Contact with and (suspected) exposure to covid-19: Secondary | ICD-10-CM | POA: Insufficient documentation

## 2021-05-07 DIAGNOSIS — Z01812 Encounter for preprocedural laboratory examination: Secondary | ICD-10-CM | POA: Insufficient documentation

## 2021-05-07 LAB — SARS CORONAVIRUS 2 (TAT 6-24 HRS): SARS Coronavirus 2: NEGATIVE

## 2021-05-07 NOTE — Telephone Encounter (Signed)
Left message to call back and ask to speak with pre-op team.  Darreld Mclean, PA-C 05/07/2021 9:29 AM

## 2021-05-07 NOTE — Telephone Encounter (Signed)
Johnathan [name] with Johnathan [name] states the faxed recommendation was not received. She is requesting to have it resubmitted.  Phone #: [phone] Fax #: [phone]

## 2021-05-07 NOTE — Progress Notes (Signed)

## 2021-05-07 NOTE — Telephone Encounter (Signed)
    Tatsuya Okray DOB:  04/29/1956  MRN:  [id number]   Primary Cardiologist: Quay Burow, MD  Chart reviewed as part of pre-operative protocol coverage. Patient was last seen by Dr. Gwenlyn Found in 07/2020. He was contacted today for further evaluation and reported doing well since last visit. No chest pain, shortness of breath, acute CHF symptoms, recent palpitations, or syncope. Able to complete >4.0 METS without any problems. Given past medical history and time since last visit, based on ACC/AHA guidelines, Jazon Jipson would be at acceptable risk for the planned procedure without further cardiovascular testing.   I will route this recommendation to the requesting party via Epic fax function and remove from pre-op pool.  Please call with questions.  Darreld Mclean, PA-C 05/07/2021, 12:05 PM

## 2021-05-08 NOTE — Anesthesia Preprocedure Evaluation (Addendum)
Anesthesia Evaluation  Patient identified by MRN, date of birth, ID band Patient awake    Reviewed: Allergy & Precautions, NPO status , Patient's Chart, lab work & pertinent test results  Airway Mallampati: II  TM Distance: >3 FB Neck ROM: Full    Dental no notable dental hx. (+) Teeth Intact, Dental Advisory Given   Pulmonary sleep apnea ,    Pulmonary exam normal breath sounds clear to auscultation       Cardiovascular Exercise Tolerance: Good Normal cardiovascular exam+ dysrhythmias Atrial Fibrillation  Rhythm:Regular Rate:Normal  11/2018 Echo Left ventricle: The cavity size was normal. Wall thickness was  increased in a pattern of moderate LVH. Systolic function was  normal. The estimated ejection fraction was in the range of 55%  to 60%. Wall motion was normal; there were no regional wall  motion abnormalities. Left ventricular diastolic function  parameters were normal.  - Aortic valve: There was mild regurgitation.  - Left atrium: The atrium was mildly dilated.  - Atrial septum: No defect or patent foramen ovale was identified.    Neuro/Psych negative neurological ROS  negative psych ROS   GI/Hepatic negative GI ROS, Neg liver ROS,   Endo/Other  negative endocrine ROS  Renal/GU negative Renal ROS     Musculoskeletal  (+) Arthritis ,   Abdominal   Peds  Hematology   Anesthesia Other Findings   Reproductive/Obstetrics                           Anesthesia Physical Anesthesia Plan  ASA: II  Anesthesia Plan: General   Post-op Pain Management:    Induction: Intravenous  PONV Risk Score and Plan: 3 and Treatment may vary due to age or medical condition, Ondansetron, Dexamethasone and Midazolam  Airway Management Planned: LMA  Additional Equipment: None  Intra-op Plan:   Post-operative Plan:   Informed Consent: I have reviewed the patients History and Physical,  chart, labs and discussed the procedure including the risks, benefits and alternatives for the proposed anesthesia with the patient or authorized representative who has indicated his/her understanding and acceptance.     Dental advisory given  Plan Discussed with:   Anesthesia Plan Comments: (LMA GA w)       Anesthesia Quick Evaluation

## 2021-05-09 ENCOUNTER — Ambulatory Visit (HOSPITAL_BASED_OUTPATIENT_CLINIC_OR_DEPARTMENT_OTHER)
Admission: RE | Admit: 2021-05-09 | Discharge: 2021-05-09 | Disposition: A | Discharge status: Home / Self Care | Payer: 59 | Attending: Orthopaedic Surgery | Admitting: Orthopaedic Surgery

## 2021-05-09 ENCOUNTER — Other Ambulatory Visit: Payer: Self-pay

## 2021-05-09 ENCOUNTER — Ambulatory Visit (HOSPITAL_BASED_OUTPATIENT_CLINIC_OR_DEPARTMENT_OTHER): Payer: 59 | Admitting: Anesthesiology

## 2021-05-09 ENCOUNTER — Encounter (HOSPITAL_BASED_OUTPATIENT_CLINIC_OR_DEPARTMENT_OTHER)
Admission: RE | Disposition: A | Discharge status: Home / Self Care | Payer: Self-pay | Source: Home / Self Care | Attending: Orthopaedic Surgery

## 2021-05-09 ENCOUNTER — Encounter (HOSPITAL_BASED_OUTPATIENT_CLINIC_OR_DEPARTMENT_OTHER): Payer: Self-pay | Admitting: Orthopaedic Surgery

## 2021-05-09 DIAGNOSIS — E785 Hyperlipidemia, unspecified: Secondary | ICD-10-CM | POA: Insufficient documentation

## 2021-05-09 DIAGNOSIS — Z8249 Family history of ischemic heart disease and other diseases of the circulatory system: Secondary | ICD-10-CM | POA: Diagnosis not present

## 2021-05-09 DIAGNOSIS — M85461 Solitary bone cyst, right tibia and fibula: Secondary | ICD-10-CM | POA: Insufficient documentation

## 2021-05-09 DIAGNOSIS — R2240 Localized swelling, mass and lump, unspecified lower limb: Secondary | ICD-10-CM | POA: Diagnosis not present

## 2021-05-09 DIAGNOSIS — I4819 Other persistent atrial fibrillation: Secondary | ICD-10-CM | POA: Insufficient documentation

## 2021-05-09 DIAGNOSIS — M1A9XX1 Chronic gout, unspecified, with tophus (tophi): Secondary | ICD-10-CM | POA: Insufficient documentation

## 2021-05-09 DIAGNOSIS — Z79899 Other long term (current) drug therapy: Secondary | ICD-10-CM | POA: Insufficient documentation

## 2021-05-09 DIAGNOSIS — Z882 Allergy status to sulfonamides status: Secondary | ICD-10-CM | POA: Insufficient documentation

## 2021-05-09 HISTORY — DX: Cardiac arrhythmia, unspecified: I49.9

## 2021-05-09 HISTORY — PX: BONE EXCISION: SHX6730

## 2021-05-09 HISTORY — PX: EXCISION MORTON'S NEUROMA: SHX5013

## 2021-05-09 SURGERY — EXCISION MASS
Anesthesia: Choice | Site: Second Toe | Laterality: Right

## 2021-05-09 SURGERY — BONE EXCISION
Anesthesia: General | Site: Knee | Laterality: Right

## 2021-05-09 MED ORDER — FENTANYL CITRATE (PF) 100 MCG/2ML IJ SOLN
INTRAMUSCULAR | Status: DC | PRN
  Administered 2021-05-09 (×2): 25 ug via INTRAVENOUS

## 2021-05-09 MED ORDER — FENTANYL CITRATE (PF) 100 MCG/2ML IJ SOLN
INTRAMUSCULAR | Status: AC
  Filled 2021-05-09: qty 2

## 2021-05-09 MED ORDER — ACETAMINOPHEN 500 MG PO TABS: 1000.0000 mg | ORAL_TABLET | Freq: Three times a day (TID) | ORAL | 0 refills | Status: AC

## 2021-05-09 MED ORDER — EPHEDRINE SULFATE 50 MG/ML IJ SOLN
INTRAMUSCULAR | Status: DC | PRN
  Administered 2021-05-09 (×2): 5 mg via INTRAVENOUS

## 2021-05-09 MED ORDER — ONDANSETRON HCL 4 MG/2ML IJ SOLN: 4.0000 mg | Freq: Once | INTRAMUSCULAR | Status: DC | PRN

## 2021-05-09 MED ORDER — ASPIRIN 81 MG PO CHEW
81.0000 mg | CHEWABLE_TABLET | Freq: Two times a day (BID) | ORAL | 0 refills | Status: DC
Start: 2021-05-09 — End: 2022-03-18

## 2021-05-09 MED ORDER — LIDOCAINE HCL (CARDIAC) PF 100 MG/5ML IV SOSY
PREFILLED_SYRINGE | INTRAVENOUS | Status: DC | PRN
  Administered 2021-05-09: 100 mg via INTRAVENOUS

## 2021-05-09 MED ORDER — KETOROLAC TROMETHAMINE 30 MG/ML IJ SOLN
INTRAMUSCULAR | Status: AC
  Filled 2021-05-09: qty 1

## 2021-05-09 MED ORDER — OXYCODONE HCL 5 MG PO TABS: ORAL_TABLET | ORAL | 0 refills | Status: AC

## 2021-05-09 MED ORDER — ONDANSETRON HCL 4 MG/2ML IJ SOLN
INTRAMUSCULAR | Status: DC | PRN
  Administered 2021-05-09: 4 mg via INTRAVENOUS

## 2021-05-09 MED ORDER — FENTANYL CITRATE (PF) 100 MCG/2ML IJ SOLN: 25.0000 ug | INTRAMUSCULAR | Status: DC | PRN

## 2021-05-09 MED ORDER — PROPOFOL 10 MG/ML IV BOLUS
INTRAVENOUS | Status: AC
  Filled 2021-05-09: qty 20

## 2021-05-09 MED ORDER — DEXAMETHASONE SODIUM PHOSPHATE 10 MG/ML IJ SOLN
INTRAMUSCULAR | Status: AC
  Filled 2021-05-09: qty 1

## 2021-05-09 MED ORDER — CEFAZOLIN SODIUM-DEXTROSE 2-4 GM/100ML-% IV SOLN
2.0000 g | INTRAVENOUS | Status: AC
  Administered 2021-05-09: 2 g via INTRAVENOUS

## 2021-05-09 MED ORDER — DEXMEDETOMIDINE (PRECEDEX) IN NS 20 MCG/5ML (4 MCG/ML) IV SYRINGE
PREFILLED_SYRINGE | INTRAVENOUS | Status: AC
  Filled 2021-05-09: qty 5

## 2021-05-09 MED ORDER — ONDANSETRON HCL 4 MG/2ML IJ SOLN
INTRAMUSCULAR | Status: AC
  Filled 2021-05-09: qty 2

## 2021-05-09 MED ORDER — CEFAZOLIN SODIUM-DEXTROSE 2-4 GM/100ML-% IV SOLN
INTRAVENOUS | Status: AC
  Filled 2021-05-09: qty 100

## 2021-05-09 MED ORDER — MIDAZOLAM HCL 5 MG/5ML IJ SOLN
INTRAMUSCULAR | Status: DC | PRN
  Administered 2021-05-09: 2 mg via INTRAVENOUS

## 2021-05-09 MED ORDER — VANCOMYCIN HCL 1000 MG IV SOLR
INTRAVENOUS | Status: AC
  Filled 2021-05-09: qty 1000

## 2021-05-09 MED ORDER — LIDOCAINE 2% (20 MG/ML) 5 ML SYRINGE
INTRAMUSCULAR | Status: AC
  Filled 2021-05-09: qty 10

## 2021-05-09 MED ORDER — MIDAZOLAM HCL 2 MG/2ML IJ SOLN
INTRAMUSCULAR | Status: AC
  Filled 2021-05-09: qty 2

## 2021-05-09 MED ORDER — DEXAMETHASONE SODIUM PHOSPHATE 10 MG/ML IJ SOLN
INTRAMUSCULAR | Status: DC | PRN
  Administered 2021-05-09: 10 mg via INTRAVENOUS

## 2021-05-09 MED ORDER — ONDANSETRON HCL 4 MG PO TABS: 4.0000 mg | ORAL_TABLET | Freq: Three times a day (TID) | ORAL | 0 refills | Status: AC | PRN

## 2021-05-09 MED ORDER — DEXMEDETOMIDINE (PRECEDEX) IN NS 20 MCG/5ML (4 MCG/ML) IV SYRINGE
PREFILLED_SYRINGE | INTRAVENOUS | Status: DC | PRN
  Administered 2021-05-09: 12 ug via INTRAVENOUS

## 2021-05-09 MED ORDER — ACETAMINOPHEN 10 MG/ML IV SOLN: 1000.0000 mg | Freq: Once | INTRAVENOUS | Status: DC | PRN

## 2021-05-09 MED ORDER — LACTATED RINGERS IV SOLN: INTRAVENOUS | Status: DC

## 2021-05-09 MED ORDER — PROPOFOL 10 MG/ML IV BOLUS
INTRAVENOUS | Status: DC | PRN
  Administered 2021-05-09: 110 mg via INTRAVENOUS

## 2021-05-09 MED ORDER — EPHEDRINE 5 MG/ML INJ
INTRAVENOUS | Status: AC
  Filled 2021-05-09: qty 20

## 2021-05-09 MED ORDER — VANCOMYCIN HCL 1 G IV SOLR
INTRAVENOUS | Status: DC | PRN
  Administered 2021-05-09: 1000 mg

## 2021-05-09 MED ORDER — BUPIVACAINE HCL (PF) 0.25 % IJ SOLN
INTRAMUSCULAR | Status: DC | PRN
  Administered 2021-05-09: 10 mL
  Administered 2021-05-09: 20 mL

## 2021-05-09 SURGICAL SUPPLY — 90 items
BANDAGE ESMARK 6X9 LF (GAUZE/BANDAGES/DRESSINGS) IMPLANT
BENZOIN TINCTURE PRP APPL 2/3 (GAUZE/BANDAGES/DRESSINGS) IMPLANT
BLADE SURG 10 STRL SS (BLADE) ×3 IMPLANT
BLADE SURG 15 STRL LF DISP TIS (BLADE) ×8 IMPLANT
BLADE SURG 15 STRL SS (BLADE) ×4
BNDG COHESIVE 4X5 TAN STRL (GAUZE/BANDAGES/DRESSINGS) IMPLANT
BNDG CONFORM 2 STRL LF (GAUZE/BANDAGES/DRESSINGS) ×3 IMPLANT
BNDG ELASTIC 4X5.8 VLCR STR LF (GAUZE/BANDAGES/DRESSINGS) ×3 IMPLANT
BNDG ELASTIC 6X5.8 VLCR STR LF (GAUZE/BANDAGES/DRESSINGS) ×3 IMPLANT
BNDG ESMARK 4X9 LF (GAUZE/BANDAGES/DRESSINGS) IMPLANT
BNDG ESMARK 6X9 LF (GAUZE/BANDAGES/DRESSINGS)
CHLORAPREP W/TINT 26 (MISCELLANEOUS) ×3 IMPLANT
CLSR STERI-STRIP ANTIMIC 1/2X4 (GAUZE/BANDAGES/DRESSINGS) ×3 IMPLANT
COVER BACK TABLE 60X90IN (DRAPES) IMPLANT
COVER WAND RF STERILE (DRAPES) IMPLANT
CUFF TOURN SGL QUICK 34 (TOURNIQUET CUFF) ×1
CUFF TOURN SGL QUICK 42 (TOURNIQUET CUFF) IMPLANT
CUFF TRNQT CYL 34X4.125X (TOURNIQUET CUFF) ×2 IMPLANT
DECANTER SPIKE VIAL GLASS SM (MISCELLANEOUS) ×3 IMPLANT
DRAPE C-ARM 42X72 X-RAY (DRAPES) IMPLANT
DRAPE C-ARMOR (DRAPES) IMPLANT
DRAPE EXTREMITY T 121X128X90 (DISPOSABLE) ×6 IMPLANT
DRAPE IMP U-DRAPE 54X76 (DRAPES) IMPLANT
DRAPE INCISE IOBAN 66X45 STRL (DRAPES) IMPLANT
DRAPE U-SHAPE 47X51 STRL (DRAPES) ×3 IMPLANT
DRSG AQUACEL AG ADV 3.5X10 (GAUZE/BANDAGES/DRESSINGS) IMPLANT
ELECT REM PT RETURN 9FT ADLT (ELECTROSURGICAL) ×3
ELECTRODE REM PT RTRN 9FT ADLT (ELECTROSURGICAL) ×2 IMPLANT
GAUZE SPONGE 4X4 12PLY STRL (GAUZE/BANDAGES/DRESSINGS) ×3 IMPLANT
GAUZE XEROFORM 1X8 LF (GAUZE/BANDAGES/DRESSINGS) ×3 IMPLANT
GLOVE SRG 8 PF TXTR STRL LF DI (GLOVE) ×4 IMPLANT
GLOVE SURG ENC MOIS LTX SZ6.5 (GLOVE) ×6 IMPLANT
GLOVE SURG ENC TEXT LTX SZ7.5 (GLOVE) ×3 IMPLANT
GLOVE SURG LTX SZ8 (GLOVE) ×3 IMPLANT
GLOVE SURG UNDER POLY LF SZ6.5 (GLOVE) ×3 IMPLANT
GLOVE SURG UNDER POLY LF SZ7 (GLOVE) ×3 IMPLANT
GLOVE SURG UNDER POLY LF SZ8 (GLOVE) ×2
GOWN STRL REUS W/ TWL LRG LVL3 (GOWN DISPOSABLE) ×2 IMPLANT
GOWN STRL REUS W/ TWL XL LVL3 (GOWN DISPOSABLE) IMPLANT
GOWN STRL REUS W/TWL LRG LVL3 (GOWN DISPOSABLE) ×1
GOWN STRL REUS W/TWL XL LVL3 (GOWN DISPOSABLE) ×6 IMPLANT
IMMOBILIZER KNEE 22 UNIV (SOFTGOODS) IMPLANT
IMMOBILIZER KNEE 24 THIGH 36 (MISCELLANEOUS) IMPLANT
IMMOBILIZER KNEE 24 UNIV (MISCELLANEOUS)
KIT ASCP FXDISP 3X8XBTNDS (KITS) IMPLANT
KIT BIO-TENODESIS 3X8 DISP (KITS)
NDL SAFETY ECLIPSE 18X1.5 (NEEDLE) IMPLANT
NDL SUT 6 .5 CRC .975X.05 MAYO (NEEDLE) IMPLANT
NEEDLE HYPO 18GX1.5 SHARP (NEEDLE)
NEEDLE MAYO TAPER (NEEDLE)
NEEDLE MAYO TROCAR (NEEDLE) IMPLANT
NS IRRIG 1000ML POUR BTL (IV SOLUTION) ×3 IMPLANT
PACK ARTHROSCOPY DSU (CUSTOM PROCEDURE TRAY) ×3 IMPLANT
PACK BASIN DAY SURGERY FS (CUSTOM PROCEDURE TRAY) ×3 IMPLANT
PAD CAST 4YDX4 CTTN HI CHSV (CAST SUPPLIES) IMPLANT
PADDING CAST ABS 6INX4YD NS (CAST SUPPLIES)
PADDING CAST ABS COTTON 6X4 NS (CAST SUPPLIES) IMPLANT
PADDING CAST COTTON 4X4 STRL (CAST SUPPLIES)
PADDING CAST SYNTHETIC 4 (CAST SUPPLIES)
PADDING CAST SYNTHETIC 4X4 STR (CAST SUPPLIES) IMPLANT
PENCIL SMOKE EVACUATOR (MISCELLANEOUS) ×3 IMPLANT
RETRIEVER SUT HEWSON (MISCELLANEOUS) IMPLANT
SHEET MEDIUM DRAPE 40X70 STRL (DRAPES) IMPLANT
SLEEVE SCD COMPRESS KNEE MED (STOCKING) ×3 IMPLANT
SPONGE LAP 18X18 RF (DISPOSABLE) ×3 IMPLANT
STAPLER VISISTAT 35W (STAPLE) IMPLANT
STOCKINETTE 6  STRL (DRAPES)
STOCKINETTE 6 STRL (DRAPES) IMPLANT
STRIP CLOSURE SKIN 1/2X4 (GAUZE/BANDAGES/DRESSINGS) IMPLANT
SUCTION FRAZIER HANDLE 10FR (MISCELLANEOUS)
SUCTION TUBE FRAZIER 10FR DISP (MISCELLANEOUS) IMPLANT
SUT BONE WAX W31G (SUTURE) IMPLANT
SUT ETHILON 3 0 PS 1 (SUTURE) ×9 IMPLANT
SUT FIBERWIRE #2 38 REV NDL BL (SUTURE)
SUT FIBERWIRE #5 38 CONV NDL (SUTURE)
SUT MNCRL AB 3-0 PS2 18 (SUTURE) ×3 IMPLANT
SUT MNCRL AB 4-0 PS2 18 (SUTURE) IMPLANT
SUT PDS AB 2-0 CT2 27 (SUTURE) ×3 IMPLANT
SUT VIC AB 3-0 FS2 27 (SUTURE) IMPLANT
SUT VIC AB 3-0 SH 27 (SUTURE) ×1
SUT VIC AB 3-0 SH 27X BRD (SUTURE) ×2 IMPLANT
SUTURE FIBERWR #5 38 CONV NDL (SUTURE) IMPLANT
SUTURE FIBERWR#2 38 REV NDL BL (SUTURE) IMPLANT
SUTURE TAPE 1.3 FIBERLOP 20 ST (SUTURE) IMPLANT
SUTURETAPE 1.3 FIBERLOOP 20 ST (SUTURE)
SYR 10ML LL (SYRINGE) IMPLANT
SYR BULB EAR ULCER 3OZ GRN STR (SYRINGE) ×3 IMPLANT
TOWEL GREEN STERILE FF (TOWEL DISPOSABLE) ×6 IMPLANT
TUBE CONNECTING 20X1/4 (TUBING) IMPLANT
TUBE SUCTION HIGH CAP CLEAR NV (SUCTIONS) ×3 IMPLANT

## 2021-05-09 NOTE — H&P (Signed)
PREOPERATIVE H&P  Chief Complaint: SOLITARY BONE CYST, RIGHT TIBIA AND FIBULA M85.461  HPI: Johnathan [name] is a 65 y.o. male who is scheduled for, Procedure(s): RIGHT TIBIA EXCISION EXCISION OF RIGHT SECOND TOE MASS, POSSIBLE EXCISION OF SKIN.   Patient has a past medical history significant for atrial fibrillation not on anticoagulation and RLS .   This is a 65 year old who initially had a large gouty tophus on the anterior aspect of his knee based on an ultrasound evaluation by Dr. Barbaraann Barthel.. He has had right knee pain for quite some time.  He has had eight months of swelling in the anterior knee. No fevers or chills. He has had this drained multiple times but he continues to have a firm nodule at the front of his knee. He has a history of gout. It has not really affected his knee in the past from a symptomatic standpoint. He now has some of the same symptoms in his 2nd toe.  He wants to consider removing these as well.    His symptoms are rated as moderate to severe, and have been worsening.  This is significantly impairing activities of daily living.    Please see clinic note for further details on this patient's care.    He has elected for surgical management.   Past Medical History:  Diagnosis Date  . Arthritis    hands and feet  . Dysrhythmia    a-fib  . Elevated LFTs   . Hearing aid worn 2021   right  . Hyperlipidemia   . Persistent atrial fibrillation ([place])   . RLS (restless legs syndrome)   . Sleep apnea    does not use CPAP   Past Surgical History:  Procedure Laterality Date  . ATRIAL FIBRILLATION ABLATION  2015  . COLONOSCOPY    . FOOT SURGERY Left   . HERNIA REPAIR    . LUMBAR [place] SURGERY  2012   L3-L4, No hardware   Social History   Socioeconomic History  . Marital status: Married    Spouse name: [name]  . Number of children: 2  . Years of education: 22  . Highest education level: Not on file  Occupational History  . Not on file  Tobacco Use   . Smoking status: Never Smoker  . Smokeless tobacco: Former Systems developer    Types: Secondary school teacher  . Vaping Use: Never used  Substance and Sexual Activity  . Alcohol use: Yes    Alcohol/week: 10.0 standard drinks    Types: 10 Cans of beer per week    Comment: social  . Drug use: No  . Sexual activity: Yes    Partners: Female    Comment: Married  Other Topics Concern  . Not on file  Social History Narrative   Married to [place]. 2 children [place].    BS degree.    Wear seatbelt. Wears a bicycle helmet. Smoke detector in the home.   Firearms in the home.   Feels safe in  Relationships.   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  Diabetes Mother   . Diabetes Brother   . Early death Brother   . Heart disease Brother   . Colon cancer Neg Hx   . Esophageal cancer Neg Hx   . Rectal cancer Neg Hx   . Stomach cancer Neg Hx   . Liver disease Neg Hx   . Pancreatic cancer Neg Hx    Allergies  Allergen Reactions  . Sulfa Antibiotics     Unknown reaction in childhood   Prior to Admission medications   Medication Sig Start Date End Date Taking? Authorizing Provider  atorvastatin (LIPITOR) 40 MG tablet Take 1 tablet (40 mg total) by mouth daily. 08/29/20  Yes Kuneff, Renee A, DO  dofetilide (TIKOSYN) 500 MCG capsule Take 1 capsule (500 mcg total) by mouth 2 (two) times daily. NEEDS APPOINTMENT FOR FUTURE REFILLS 08/24/20  Yes Lorretta Harp, MD  escitalopram (LEXAPRO) 5 MG tablet Take 1 tablet (5 mg total) by mouth daily. 05/02/21  Yes Kuneff, Renee A, DO  metoprolol tartrate (LOPRESSOR) 25 MG tablet Take 1 tablet (25 mg  total) by mouth as needed (high heart rate). 08/28/20 11/26/20 Yes Lorretta Harp, MD    ROS: All other systems have been reviewed and were otherwise negative with the exception of those mentioned in the HPI and as above.  Physical Exam: General: Alert, no acute distress Cardiovascular: No pedal edema Respiratory: No cyanosis, no use of accessory musculature GI: No organomegaly, abdomen is soft and non-tender Skin: No lesions in the area of chief complaint Neurologic: Sensation intact distally Psychiatric: Patient is competent for consent with normal mood and affect Lymphatic: No axillary or cervical lymphadenopathy  MUSCULOSKELETAL:  Right lower extremity: Right knee demonstrates full range of motion.  Large mass on the anterior aspect of the knee in the prepatellar bursal region.  He has no joint line tenderness to palpation.  He has good straight leg raise test.  He has atypical appearance of the 2nd toe with what appears to be gouty tophus in the distal interphalangeal joint.    Imaging: Xrays show no acute bony injury   Assessment: Right knee and foot gouty tophi  SOLITARY BONE CYST, RIGHT TIBIA AND FIBULA M85.461  Plan: Plan for Procedure(s): RIGHT TIBIA EXCISION EXCISION OF RIGHT SECOND TOE MASS, POSSIBLE EXCISION OF SKIN  The risks benefits and alternatives were discussed with the patient including but not limited to the risks of nonoperative treatment, versus surgical intervention including infection, bleeding, nerve injury,  blood clots, cardiopulmonary complications, morbidity, mortality, among others, and they were willing to proceed.   The patient acknowledged the explanation, agreed to proceed with the plan and consent was signed.   Operative Plan: Removal of right knee lesion. Dr. Lucia Gaskins to perform removal of gouty tophus from right second toe concomitantly. Discharge Medications: Standard DVT Prophylaxis: Aspirin Physical Therapy: +/- Special Discharge needs:  +/-   Ethelda Chick, PA-C  05/09/2021 8:07 AM

## 2021-05-09 NOTE — Op Note (Signed)
Orthopaedic Surgery Operative Note (CSN: [id number])  Johnathan [name]  12/23/1956 Date of Surgery: 05/09/2021   Diagnoses:  Soft tissue tumor anterior knee  Procedure: Soft tissue removal 6 x 8 cm   Operative Finding Successful completion of the planned procedure.  Patient's lesion was consistent with gout and measurements above.  Sent for pathology.  No involvement of the patella retinaculum and the patellar tendon was intact completely.  Post-operative plan: The patient will be weightbearing to tolerance with sutures removed about 2 weeks after surgery.  The patient will be discharged home.  DVT prophylaxis Aspirin 81 mg twice daily for 6 weeks.   Pain control with PRN pain medication preferring oral medicines.  Follow up plan will be scheduled in approximately 7 days for incision check.  Post-Op Diagnosis: Same Surgeons:Primary: Hiram Gash, MD, simultaneously Dr. Lucia Gaskins performed surgery on the patient's foot. Assistants:Caroline McBane PA-C Location: [facility] OR ROOM 6 Anesthesia: General with local anesthesia Antibiotics: Ancef 2 g with local vancomycin powder 1 g at the surgical site Tourniquet time:  Total Tourniquet Time Documented: Thigh (Right) - 30 minutes Total: Thigh (Right) - 30 minutes  Estimated Blood Loss: Minimal Complications: None Specimens: 1 for pathology, right knee anterior mass Implants: * No implants in log *  Indications for Surgery:   Johnathan [name] is a 65 y.o. male with history of gout and mass in the anterior knee.  He additionally had a mass on his second toe on the same side and this was handled by Dr. Lucia Gaskins.  Benefits and risks of operative and nonoperative management were discussed prior to surgery with patient/guardian(s) and informed consent form was completed.  Specific risks including infection, need for additional surgery, extensor mechanism issue, wound breakdown, stiffness and recurrence amongst others.   Procedure:   The patient was identified  properly. Informed consent was obtained and the surgical site was marked. The patient was taken up to suite where general anesthesia was induced.  The patient was positioned supine on a regular bed.  The right knee was prepped and draped in the usual sterile fashion.  Timeout was performed before the beginning of the case.  Tourniquet was used for the above duration.  We identified the mass over the inferior half of the patella and the patellar tendon.  We made a longitudinal incision about 6 cm in length directly over top of this going through skin sharp achieving hemostasis we progressed.  We identified the mass and it was well encapsulated though when we nicked it there was clearly gouty paste that was visible.  We able to remove the mass en bloc and remove any additional small areas of gouty buildup.  We irrigated copiously at that point and noted that the patellar tendon was intact.  Mass was sent for pathology.  We took the knee through range of motion to ensure that it was not damaging to the patellar tendon or extensor mechanism and it was not.  We irrigated the wound copiously before placing local antibiotic as listed above.  We closed the incision in a multilayer fashion with nonabsorbable suture.  Sterile dressing was placed.  Patient was awoken taken to PACU in stable condition.  Due to the complexity of this case and the need for a foot and ankle subspecialist as well as a sports medicine subspecialist this was a combination surgery with Dr. Lucia Gaskins.  Noemi Chapel, PA-C, present and scrubbed throughout the case, critical for completion in a timely fashion, and for retraction, instrumentation, closure.

## 2021-05-09 NOTE — Transfer of Care (Signed)
Immediate Anesthesia Transfer of Care Note  Patient: Johnathan [name]  Procedure(s) Performed: RIGHT TIBIA EXCISION (Right Knee) EXCISION RIGHT SECOND TOE MASS (Right Toe) EXCISION OF RIGHT SECOND TOE MASS, POSSIBLE EXCISION OF SKIN (Right )  Patient Location: PACU  Anesthesia Type:General  Level of Consciousness: awake, alert  and oriented  Airway & Oxygen Therapy: Patient Spontanous Breathing and Patient connected to face mask oxygen  Post-op Assessment: Report given to RN and Post -op Vital signs reviewed and stable  Post vital signs: Reviewed and stable  Last Vitals:  Vitals Value Taken Time  BP 112/77 05/09/21 1245  Temp    Pulse 53 05/09/21 1246  Resp 22 05/09/21 1246  SpO2 96 % 05/09/21 1246  Vitals shown include unvalidated device data.  Last Pain:  Vitals:   05/09/21 1128  TempSrc: Oral  PainSc:          Complications: No complications documented.

## 2021-05-09 NOTE — Anesthesia Procedure Notes (Signed)
Procedure Name: LMA Insertion Performed by: Ceylin Dreibelbis S, CRNA Pre-anesthesia Checklist: Patient identified, Emergency Drugs available, Suction available and Patient being monitored Patient Re-evaluated:Patient Re-evaluated prior to induction Oxygen Delivery Method: Circle System Utilized Preoxygenation: Pre-oxygenation with 100% oxygen Induction Type: IV induction Ventilation: Mask ventilation without difficulty LMA: LMA inserted LMA Size: 5.0 Number of attempts: 1 Airway Equipment and Method: Bite block Placement Confirmation: positive ETCO2 Tube secured with: Tape Dental Injury: Teeth and Oropharynx as per pre-operative assessment        

## 2021-05-09 NOTE — Anesthesia Postprocedure Evaluation (Signed)
Anesthesia Post Note  Patient: Johnathan [name]  Procedure(s) Performed: RIGHT TIBIA EXCISION (Right Knee) EXCISION OF RIGHT SECOND TOE MASS (Right Foot)     Patient location during evaluation: PACU Anesthesia Type: General Level of consciousness: awake and alert Pain management: pain level controlled Vital Signs Assessment: post-procedure vital signs reviewed and stable Respiratory status: spontaneous breathing, nonlabored ventilation, respiratory function stable and patient connected to nasal cannula oxygen Cardiovascular status: blood pressure returned to baseline and stable Postop Assessment: no apparent nausea or vomiting Anesthetic complications: no   No complications documented.  Last Vitals:  Vitals:   05/09/21 1300 05/09/21 1327  BP: 123/69 121/78  Pulse: (!) 50 (!) 57  Resp: 17 18  Temp:  36.8 C  SpO2: 94% 95%    Last Pain:  Vitals:   05/09/21 1327  TempSrc:   PainSc: 0-No pain        RLE Motor Response: Purposeful movement (05/09/21 1327) RLE Sensation: Full sensation (05/09/21 1327)      Barnet Glasgow

## 2021-05-09 NOTE — Interval H&P Note (Signed)
All questions answered. I signed for Dr.Adair as well as myself.

## 2021-05-09 NOTE — Discharge Instructions (Signed)
Ophelia Charter MD, MPH Noemi Chapel, PA-C [place] [address], [address] [phone] (tel)   [phone] (fax)   POST-OPERATIVE INSTRUCTIONS - Knee  WOUND CARE - You may remove the Operative Dressing on Post-Op Day #3 (72hrs after surgery).   -  Alternatively if you would like you can leave dressing on until follow-up if within 7-8 days but keep it dry. - An ACE wrap may be used to control swelling, do not wrap this too tight.  If the initial ACE wrap feels too tight you may loosen it. - There may be a small amount of fluid/bleeding leaking at the surgical site.  - This is normal, you may change/reinforce the bandage as needed.  - Use the Cryocuff or Ice as often as possible for the first 7 days, then as needed for pain relief. Always keep a towel, ACE wrap or other barrier between the cooling unit and your skin.  - You may shower on Post-Op Day #3. Gently pat the area dry. Do not soak the knee in water or submerge it.  -           Remove the dressings before showering -           You may cover incisions with a new bandage or gauze/tape afterwards - Do not go swimming in the pool or ocean until 4 weeks after surgery or when otherwise instructed.  -           Keep incisions as dry as possible.   BRACE/AMBULATION -  You will not need a brace after this procedure -            You can put full weight on your operative leg as you feel comfortable  REGIONAL ANESTHESIA (NERVE BLOCKS) - The anesthesia team may have performed a nerve block for you if safe in the setting of your care.  This is a great tool used to minimize pain.  Typically the block may start wearing off overnight.  This can be a challenging period but please utilize your as needed pain medications to try and manage this period and know it will be a brief transition as the nerve block wears completely   POST-OP MEDICATIONS - Multimodal approach to pain control - In general your pain will be  controlled with a combination of substances.  Prescriptions unless otherwise discussed are electronically sent to your pharmacy.  This is a carefully made plan we use to minimize narcotic use.     - Acetaminophen - Non-narcotic pain medicine taken on a scheduled basis    - Take two 500 mg tablets (1,000mg  total) every 8 hours - Oxycodone - This is a strong narcotic, to be used only on an "as needed" basis for severe pain. - Aspirin 81mg  - This medicine is used to minimize the risk of blood clots after surgery. -  Zofran - take as needed for nausea   FOLLOW-UP   Please call the office to schedule a follow-up appointment for your incision check, 7-10 days post-operatively.   IF YOU HAVE ANY QUESTIONS, PLEASE FEEL FREE TO CALL OUR OFFICE.   HELPFUL INFORMATION  - If you had a block, it will wear off between 8-24 hrs postop typically.  This is period when your pain may go from nearly zero to the pain you would have had post-op without the block.  This is an abrupt transition but nothing dangerous is happening.  You may take an extra dose of  narcotic when this happens.   Keep your leg elevated to decrease swelling, which will then in turn decrease your pain. I would elevate the foot of your bed by putting a couple of couch pillows between your mattress and box spring. I would not keep pillow directly under your ankle.  - Do not sleep with a pillow behind your knee even if it is more comfortable as this may make it harder to get your knee fully straight long term.   There will be MORE swelling on days 1-3 than there is on the day of surgery.  This also is normal. The swelling will decrease with the anti-inflammatory medication, ice and keeping it elevated. The swelling will make it more difficult to bend your knee. As the swelling goes down your motion will become easier   You may develop swelling and bruising that extends from your knee down to your calf and perhaps even to your foot over  the next week. Do not be alarmed. This too is normal, and it is due to gravity   There may be some numbness adjacent to the incision site. This may last for 6-12 months or longer in some patients and is expected.   You may return to sedentary work/school in the next couple of days when you feel up to it. You will need to keep your leg elevated as much as possible    You should wean off your narcotic medicines as soon as you are able.  Most patients will be off or using minimal narcotics before their first postop appointment.    We suggest you use the pain medication the first night prior to going to bed, in order to ease any pain when the anesthesia wears off. You should avoid taking pain medications on an empty stomach as it will make you nauseous.   Do not drink alcoholic beverages or take illicit drugs when taking pain medications.   It is against the law to drive while taking narcotics. You cannot drive if your Right leg is in brace locked in extension.   Pain medication may make you constipated.  Below are a few solutions to try in this order:  o Decrease the amount of pain medication if you aren't having pain.  o Drink lots of decaffeinated fluids.  o Drink prune juice and/or eat dried prunes   o If the first 3 don't work start with additional solutions  o Take Colace - an over-the-counter stool softener  o Take Senokot - an over-the-counter laxative  o Take Miralax - a stronger over-the-counter laxative    For more information including helpful videos and documents visit our website:   [email or web address]    Post Anesthesia Home Care Instructions  Activity: Get plenty of rest for the remainder of the day. A responsible individual must stay with you for 24 hours following the procedure.  For the next 24 hours, DO NOT: -Drive a car -Paediatric nurse -Drink alcoholic beverages -Take any medication unless instructed by your  physician -Make any legal decisions or sign important papers.  Meals: Start with liquid foods such as gelatin or soup. Progress to regular foods as tolerated. Avoid greasy, spicy, heavy foods. If nausea and/or vomiting occur, drink only clear liquids until the nausea and/or vomiting subsides. Call your physician if vomiting continues.  Special Instructions/Symptoms: Your throat may feel dry or sore from the anesthesia or the breathing tube placed in your throat during surgery. If this causes discomfort, gargle with warm salt water.  The discomfort should disappear within 24 hours.  If you had a scopolamine patch placed behind your ear for the management of post- operative nausea and/or vomiting:  1. The medication in the patch is effective for 72 hours, after which it should be removed.  Wrap patch in a tissue and discard in the trash. Wash hands thoroughly with soap and water. 2. You may remove the patch earlier than 72 hours if you experience unpleasant side effects which may include dry mouth, dizziness or visual disturbances. 3. Avoid touching the patch. Wash your hands with soap and water after contact with the patch.

## 2021-05-10 ENCOUNTER — Encounter (HOSPITAL_BASED_OUTPATIENT_CLINIC_OR_DEPARTMENT_OTHER): Payer: Self-pay | Admitting: Orthopaedic Surgery

## 2021-05-10 LAB — SURGICAL PATHOLOGY

## 2021-05-10 NOTE — Op Note (Addendum)
Macrae Wiegman male 65 y.o. 05/09/2021  PreOperative Diagnosis: Right second toe soft tissue mass medial and lateral  PostOperative Diagnosis: same  PROCEDURE: Excision of right second toe mass, medial Excision of right second toe mass, lateral through separate incision  SURGEON: Melony Overly, MD; concomitant surgery with Dr. Ophelia Charter for right knee mass  ASSISTANT: none  ANESTHESIA: General anesthesia with local infiltration of 10 cc of quarter percent Marcaine plain at the surgical site  FINDINGS: Medial and lateral distal soft tissue mass near the level of the DIP joint of the second toe  IMPLANTS: none  INDICATIONS:65 y.o. male presented with soft tissue mass of the distal aspect of the second toe medially and laterally.  He was having some skin irritation difficulty with shoe wear due to this mass formation.  He had seen Dr. Griffin Basil and he was diagnosed with a anterior knee mass as well.  Patient wished to have these done concomitantly under the same anesthesia and therefore this was set up.  We did discuss the role of conservative treatment but given the skin irritation overlying the masses he was indicated for surgery.   Patient understood the risks, benefits and alternatives to surgery which include but are not limited to wound healing complications, infection, nonunion, malunion, need for further surgery as well as damage to surrounding structures. They also understood the potential for continued pain in that there were no guarantees of acceptable outcome After weighing these risks the patient opted to proceed with surgery.  PROCEDURE: Patient was identified in the preoperative holding area.  The right leg was marked by Dr. Ophelia Charter.  Consent was signed.  Patient was taken to the operative suite and placed supine on the operative table.  General anesthesia was induced without difficulty. Bump was placed under the operative hip.  All bony prominences were well padded.   Tourniquet was placed on the operative thigh.  Preoperative antibiotics were given. The extremity was prepped and draped in the usual sterile fashion and surgical timeout was performed.  The limb was elevated and the tourniquet was inflated to 300 mmHg.  I began by making an incision overlying the soft tissue mass on the lateral aspect of the second toe.  This was taken sharply down to skin and subcu tissues tissue.  Upon entry of this area the mass was circumferentially dissected out.  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  This mass was deep to the fascia and apparently originating from the DIP joint of the  Second toe.  The mass was fully removed.  There is no full-thickness skin loss in this area.  The mass tissue was discarded.  Overall mass size was approximately 7 x 5 mm.  The wound was irrigated copiously with normal saline.  The underlying tissue was cleaned using a Ronguer and through sharp excisional debridement with a 15 blade.  There was some redundant skin that was removed.  Approximately 2 mm of redundant skin was removed.  The wound was then closed using 3-0 nylon suture.  We then turned our attention to the medial aspect of the second toe.  A separate vision was made overlying the mass on the medial aspect of the toe.  This is taken sharply down through skin and subcutaneous tissue.Upon entry of this area the mass was circumferentially dissected out.  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  This mass was deep to the fascia and apparently originating from the DIP joint of  the  Second toelaterally.  The mass was fully removed.  There was no full-thickness skin loss in this area.  The mass tissue was discarded.  Overall mass size was approximately 4 x 23mm.  The wound was irrigated copiously with normal saline.  The underlying tissue was cleaned using a Ronguer and through sharp excisional debridement with a 15 blade.  Was some redundant skin that  was removed.  The wound was then closed using 3-0 nylon suture.  10 cc of quarter percent Marcaine plain was injected in a digital block fashion around the second toe.  Soft dressing was placed including Xeroform and 4 x 4's.  Tourniquet was released.  He was awake from anesthesia and taken recovery in stable condition.  There were no complications.   POST OPERATIVE INSTRUCTIONS: Weightbearing as tolerated on operative extremity. He will follow-up in 2 weeks for wound check and possible suture removal No x-rays needed.   TOURNIQUET TIME: 86minutes  BLOOD LOSS:  Minimal         DRAINS: none         SPECIMEN: none       COMPLICATIONS:  * No complications entered in OR log *         Disposition: PACU - hemodynamically stable.         Condition: stable

## 2021-05-28 ENCOUNTER — Other Ambulatory Visit: Payer: Self-pay | Admitting: Family Medicine

## 2021-05-28 DIAGNOSIS — F419 Anxiety disorder, unspecified: Secondary | ICD-10-CM

## 2021-05-29 ENCOUNTER — Other Ambulatory Visit: Payer: Self-pay

## 2021-05-29 ENCOUNTER — Ambulatory Visit (INDEPENDENT_AMBULATORY_CARE_PROVIDER_SITE_OTHER): Payer: 59 | Admitting: Family Medicine

## 2021-05-29 ENCOUNTER — Encounter: Payer: Self-pay | Admitting: Family Medicine

## 2021-05-29 VITALS — BP 137/85 | HR 68 | Temp 98.4°F | Ht 72.5 in | Wt 230.0 lb

## 2021-05-29 DIAGNOSIS — Z0000 Encounter for general adult medical examination without abnormal findings: Secondary | ICD-10-CM | POA: Diagnosis not present

## 2021-05-29 DIAGNOSIS — F419 Anxiety disorder, unspecified: Secondary | ICD-10-CM

## 2021-05-29 DIAGNOSIS — G4733 Obstructive sleep apnea (adult) (pediatric): Secondary | ICD-10-CM

## 2021-05-29 DIAGNOSIS — Z79899 Other long term (current) drug therapy: Secondary | ICD-10-CM

## 2021-05-29 DIAGNOSIS — I4821 Permanent atrial fibrillation: Secondary | ICD-10-CM | POA: Diagnosis not present

## 2021-05-29 DIAGNOSIS — M1A9XX1 Chronic gout, unspecified, with tophus (tophi): Secondary | ICD-10-CM

## 2021-05-29 DIAGNOSIS — Z125 Encounter for screening for malignant neoplasm of prostate: Secondary | ICD-10-CM

## 2021-05-29 DIAGNOSIS — E7800 Pure hypercholesterolemia, unspecified: Secondary | ICD-10-CM

## 2021-05-29 DIAGNOSIS — Z131 Encounter for screening for diabetes mellitus: Secondary | ICD-10-CM

## 2021-05-29 DIAGNOSIS — Z23 Encounter for immunization: Secondary | ICD-10-CM

## 2021-05-29 DIAGNOSIS — E782 Mixed hyperlipidemia: Secondary | ICD-10-CM

## 2021-05-29 DIAGNOSIS — Z9989 Dependence on other enabling machines and devices: Secondary | ICD-10-CM

## 2021-05-29 MED ORDER — ESCITALOPRAM OXALATE 5 MG PO TABS: 5.0000 mg | ORAL_TABLET | Freq: Every day | ORAL | 1 refills | Status: DC

## 2021-05-29 MED ORDER — ATORVASTATIN CALCIUM 40 MG PO TABS: 40.0000 mg | ORAL_TABLET | Freq: Every day | ORAL | 3 refills | Status: DC

## 2021-05-29 NOTE — Patient Instructions (Addendum)
Great to see you today.  I have refilled the medication(s) we provide.   If labs were collected, we will inform you of lab results once received either by echart message or telephone call.   - echart message- for normal results that have been seen by the patient already.   - telephone call: abnormal results or if patient has not viewed results in their echart.  Yearly physical and if needing refills in 6 mos on lexapro follow up then as well.   Health Maintenance After Age 60 After age 80, you are at a higher risk for certain long-term diseases and infections as well as injuries from falls. Falls are a major cause of broken bones and head injuries in people who are older than age 73. Getting regular preventive care can help to keep you healthy and well. Preventive care includes getting regular testing and making lifestyle changes as recommended by your health care provider. Talk with your health care provider about:  Which screenings and tests you should have. A screening is a test that checks for a disease when you have no symptoms.  A diet and exercise plan that is right for you. What should I know about screenings and tests to prevent falls? Screening and testing are the best ways to find a health problem early. Early diagnosis and treatment give you the best chance of managing medical conditions that are common after age 65. Certain conditions and lifestyle choices may make you more likely to have a fall. Your health care provider may recommend:  Regular vision checks. Poor vision and conditions such as cataracts can make you more likely to have a fall. If you wear glasses, make sure to get your prescription updated if your vision changes.  Medicine review. Work with your health care provider to regularly review all of the medicines you are taking, including over-the-counter medicines. Ask your health care provider about any side effects that may make you more likely to have a fall. Tell  your health care provider if any medicines that you take make you feel dizzy or sleepy.  Osteoporosis screening. Osteoporosis is a condition that causes the bones to get weaker. This can make the bones weak and cause them to break more easily.  Blood pressure screening. Blood pressure changes and medicines to control blood pressure can make you feel dizzy.  Strength and balance checks. Your health care provider may recommend certain tests to check your strength and balance while standing, walking, or changing positions.  Foot health exam. Foot pain and numbness, as well as not wearing proper footwear, can make you more likely to have a fall.  Depression screening. You may be more likely to have a fall if you have a fear of falling, feel emotionally low, or feel unable to do activities that you used to do.  Alcohol use screening. Using too much alcohol can affect your balance and may make you more likely to have a fall. What actions can I take to lower my risk of falls? General instructions  Talk with your health care provider about your risks for falling. Tell your health care provider if: ? You fall. Be sure to tell your health care provider about all falls, even ones that seem minor. ? You feel dizzy, sleepy, or off-balance.  Take over-the-counter and prescription medicines only as told by your health care provider. These include any supplements.  Eat a healthy diet and maintain a healthy weight. A healthy diet includes low-fat dairy products,  low-fat (lean) meats, and fiber from whole grains, beans, and lots of fruits and vegetables. Home safety  Remove any tripping hazards, such as rugs, cords, and clutter.  Install safety equipment such as grab bars in bathrooms and safety rails on stairs.  Keep rooms and walkways well-lit. Activity  Follow a regular exercise program to stay fit. This will help you maintain your balance. Ask your health care provider what types of exercise are  appropriate for you.  If you need a cane or walker, use it as recommended by your health care provider.  Wear supportive shoes that have nonskid soles.   Lifestyle  Do not drink alcohol if your health care provider tells you not to drink.  If you drink alcohol, limit how much you have: ? 0-1 drink a day for women. ? 0-2 drinks a day for men.  Be aware of how much alcohol is in your drink. In the [place] one drink equals one typical bottle of beer (12 oz), one-half glass of wine (5 oz), or one shot of hard liquor (1 oz).  Do not use any products that contain nicotine or tobacco, such as cigarettes and e-cigarettes. If you need help quitting, ask your health care provider. Summary  Having a healthy lifestyle and getting preventive care can help to protect your health and wellness after age 59.  Screening and testing are the best way to find a health problem early and help you avoid having a fall. Early diagnosis and treatment give you the best chance for managing medical conditions that are more common for people who are older than age 49.  Falls are a major cause of broken bones and head injuries in people who are older than age 52. Take precautions to prevent a fall at home.  Work with your health care provider to learn what changes you can make to improve your health and wellness and to prevent falls. This information is not intended to replace advice given to you by your health care provider. Make sure you discuss any questions you have with your health care provider. Document Revised: 04/07/2019 Document Reviewed: 10/28/2017 Elsevier Patient Education  2021 [employer].

## 2021-05-29 NOTE — Progress Notes (Signed)
This visit occurred during the SARS-CoV-2 public health emergency.  Safety protocols were in place, including screening questions prior to the visit, additional usage of staff PPE, and extensive cleaning of exam room while observing appropriate contact time as indicated for disinfecting solutions.    Patient ID: Johnathan [name], male  DOB: 10-04-1956, 65 y.o.   MRN: [id number] Patient Care Team    Relationship Specialty Notifications Start End  Ma Hillock, DO PCP - General Family Medicine  04/15/17   Lorretta Harp, MD PCP - Cardiology Cardiology  12/21/19   Con Memos PCP - Pulmonology Family Medicine  12/21/19   Lorretta Harp, MD Consulting Physician Cardiology  03/22/18   Irene Shipper, MD Consulting Physician Gastroenterology  07/19/19   Dene Gentry, MD Consulting Physician Sports Medicine  03/07/20   Hiram Gash, MD Consulting Physician Orthopedic Surgery  03/07/20   Troy Sine, MD Consulting Physician Cardiology  04/02/20     Chief Complaint  Patient presents with  . Annual Exam    Pt is fasting     Subjective:  Johnathan [name] is a 65 y.o. male present for CPE/CMC. All past medical history, surgical history, allergies, family history, immunizations, medications and social history were updated in the electronic medical record today. All recent labs, ED visits and hospitalizations within the last year were reviewed.  Health maintenance:  Colonoscopy: last screen1/21/2020, recommend follow up3 yr; Immunizations: tdap UTD3/2019, influenzaencouraged yearly.  shingrix completed, covid x3 completed. PNA 20 completed today.  Infectious disease screening: HIVandHep Ccompleted PSA: low risk- desires yearly PSA Lab Results  Component Value Date   PSA 1.1 07/26/2019   PSA 1.51 03/22/2018   PSA 1.0 04/16/2015  , pt was counseled on prostate cancer screenings.  Assistive device: none Oxygen WCB:JSEG Patient has a Dental home. Hospitalizations/ED visits:  reviewed- recent surgery.    Anxiety: Pt feels anxiety is well  controlled on lexapro 5 mg qd. He may want to eventually try to take less, but he gets a headache when he stops use.   A.fib/HLD: Follows with cardio for a.fib. compliant with statin.   Depression screen [facility] 2/9 05/29/2021 08/29/2020 07/19/2019 12/20/2018 03/22/2018  Decreased Interest 0 0 0 0 0  Down, Depressed, Hopeless 0 0 0 0 0  PHQ - 2 Score 0 0 0 0 0  Altered sleeping - - 0 0 -  Tired, decreased energy - - 0 0 -  Change in appetite - - 0 0 -  Feeling bad or failure about yourself  - - 0 0 -  Trouble concentrating - - 0 0 -  Moving slowly or fidgety/restless - - 0 0 -  Suicidal thoughts - - 0 0 -  PHQ-9 Score - - 0 0 -   GAD 7 : Generalized Anxiety Score 03/07/2020 07/19/2019 12/20/2018 08/07/2017  Nervous, Anxious, on Edge 0 0 0 0  Control/stop worrying 0 0 0 0  Worry too much - different things 0 0 0 0  Trouble relaxing 0 0 0 0  Restless 0 0 0 0  Easily annoyed or irritable 0 0 0 0  Afraid - awful might happen 0 0 0 0  Total GAD 7 Score 0 0 0 0  Anxiety Difficulty Not difficult at all - - -     Fall Risk  05/29/2021 08/29/2020 07/19/2019 03/22/2018 04/15/2017  Falls in the past year? 0 0 0 No No  Number falls in past yr: 0 0 - - -  Injury with Fall? 0 0 - - -  Follow up Falls evaluation completed Falls evaluation completed Falls evaluation completed - -   Immunization History  Administered Date(s) Administered  . Influenza Split 10/21/2017  . Influenza,inj,Quad PF,6+ Mos 12/20/2018, 08/26/2019  . PFIZER(Purple Top)SARS-COV-2 Vaccination 01/23/2020, 02/18/2020, 11/01/2020  . PNEUMOCOCCAL CONJUGATE-20 05/29/2021  . Tdap 03/22/2018  . Zoster Recombinat (Shingrix) 07/26/2019, 03/07/2020   Past Medical History:  Diagnosis Date  . Arthritis    hands and feet  . Dysrhythmia    a-fib  . Elevated LFTs   . Hearing aid worn 2021   right  . Hyperlipidemia   . Persistent atrial fibrillation ([place])   . RLS (restless  legs syndrome)   . Sleep apnea    does not use CPAP   Allergies  Allergen Reactions  . Sulfa Antibiotics     Unknown reaction in childhood   Past Surgical History:  Procedure Laterality Date  . ATRIAL FIBRILLATION ABLATION  2015  . BONE EXCISION Right 05/09/2021   Procedure: RIGHT TIBIA EXCISION;  Surgeon: Hiram Gash, MD;  Location: [place];  Service: Orthopedics;  Laterality: Right;  . COLONOSCOPY    . EXCISION MORTON'S NEUROMA Right 05/09/2021   Procedure: EXCISION OF RIGHT SECOND TOE MASS;  Surgeon: Erle Crocker, MD;  Location: [place];  Service: Orthopedics;  Laterality: Right;  . FOOT SURGERY Left   . HERNIA REPAIR    . LUMBAR [place] SURGERY  2012   L3-L4, No hardware   Family History  Problem Relation Age of Onset  . Diabetes Mother   . Diabetes Brother   . Early death Brother   . Heart disease Brother   . Colon cancer Neg Hx   . Esophageal cancer Neg Hx   . Rectal cancer Neg Hx   . Stomach cancer Neg Hx   . Liver disease Neg Hx   . Pancreatic cancer Neg Hx    Social History   Social History Narrative   Married to [place]. 2 children [place].    BS degree.    Wear seatbelt. Wears a bicycle helmet. Smoke detector in the home.   Firearms in the home.   Feels safe in  Relationships.   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 If you have any questions, ask your nurse or doctor.        aspirin 81 MG chewable tablet Commonly known as: Aspirin Childrens Chew 1 tablet (81 mg total) by mouth 2 (two) times daily. For DVT prophylaxis after surgery   atorvastatin 40 MG tablet Commonly known as: LIPITOR Take 1 tablet (40 mg total) by mouth daily.   dofetilide 500  MCG capsule Commonly known as: TIKOSYN Take 1 capsule (500 mcg total) by mouth 2 (two) times daily. NEEDS APPOINTMENT FOR FUTURE REFILLS   escitalopram 5 MG tablet Commonly known as: LEXAPRO Take 1 tablet (5 mg total) by mouth daily.   metoprolol tartrate 25 MG tablet Commonly known as: LOPRESSOR Take 1 tablet (25 mg total) by mouth as needed (high heart rate).      All past medical  history, surgical history, allergies, family history, immunizations andmedications were updated in the EMR today and reviewed under the history and medication portions of their EMR.     No results found.  ROS: 14 pt review of systems performed and negative (unless mentioned in an HPI)  Objective: BP 137/85   Pulse 68   Temp 98.4 F (36.9 C) (Oral)   Ht 6' 0.5" (1.842 m)   Wt 230 lb (104.3 kg)   SpO2 98%   BMI 30.76 kg/m  Gen: Afebrile. No acute distress. Nontoxic in appearance, well-developed, well-nourished,  Pleasant male. Physically fit.  HENT: AT. [place]. Bilateral TM visualized and normal in appearance, normal external auditory canal. MMM, no oral lesions, adequate dentition. Bilateral nares within normal limits. Throat without erythema, ulcerations or exudates. no Cough on exam, no hoarseness on exam. Eyes:Pupils Equal Round Reactive to light, Extraocular movements intact,  Conjunctiva without redness, discharge or icterus. Neck/lymp/endocrine: Supple,no lymphadenopathy, no thyromegaly CV: RRR no murmru, no edema, +2/4 P posterior tibialis pulses.  Chest: CTAB, no wheeze, rhonchi or crackles. normal Respiratory effort. good Air movement. Abd: Soft. flat. NTND. BS present. no Masses palpated. No hepatosplenomegaly. No rebound tenderness or guarding. Skin: no rashes, purpura or petechiae. Warm and well-perfused. Skin intact. Neuro/Msk:  Normal gait. PERLA. EOMi. Alert. Oriented x3.  Cranial nerves II through XII intact. Muscle strength 5/5 upper/lower extremity. DTRs equal bilaterally. Psych: Normal  affect, dress and demeanor. Normal speech. Normal thought content and judgment.  No exam data present  Assessment/plan: Johnathan [name] is a 65 y.o. male present for CPE/CMC Need for pneumococcal vaccination - Pneumococcal conjugate vaccine 20-valent (Prevnar 20) Permanent atrial fibrillation (HCC) Stable meds managed by cardio Encouraged baby ASA restart - CBC with Differential/Platelet  Anxiety Stable.  Discussed tapering down/off and provided instructions for him to do so if desired.  - escitalopram (LEXAPRO) 5 MG tablet; Take 1 tablet (5 mg total) by mouth daily.  Dispense: 90 tablet; Refill: 1 If refills requested, f/u after 6 mos.  Long-term use of high-risk medication CMP Gilbert disease - Comprehensive metabolic panel Mixed hyperlipidemia - Lipid panel - TSH - atorvastatin (LIPITOR) 40 MG tablet; Take 1 tablet (40 mg total) by mouth daily.  Dispense: 90 tablet; Refill: 3 Prostate cancer screening - PSA Diabetes mellitus screening A1C Chronic gout with tophus, unspecified cause, unspecified site - Uric acid Routine general medical examination at a health care facility Colonoscopy: last screen1/21/2020, recommend follow up3 yr; Immunizations: tdap UTD3/2019, influenzaencouraged yearly.  shingrix completed, covid x3 completed. PNA 20 completed today.  Infectious disease screening: HIVandHep Ccompleted PSA: low risk- desires yearly PSA Patient was encouraged to exercise greater than 150 minutes a week. Patient was encouraged to choose a diet filled with fresh fruits and vegetables, and lean meats. AVS provided to patient today for education/recommendation on gender specific health and safety maintenance.   Return in about 1 year (around 05/29/2022) for CPE (30 min).  Orders Placed This Encounter  Procedures  . Pneumococcal conjugate vaccine 20-valent (Prevnar 20)  . CBC with Differential/Platelet  . Hemoglobin A1c  . Lipid panel  . TSH  . Comprehensive metabolic  panel  . PSA  . Uric acid   Meds ordered this encounter  Medications  . escitalopram (LEXAPRO) 5 MG tablet    Sig: Take 1 tablet (5 mg total) by mouth daily.    Dispense:  90 tablet    Refill:  1  . atorvastatin (LIPITOR) 40 MG tablet    Sig: Take 1  tablet (40 mg total) by mouth daily.    Dispense:  90 tablet    Refill:  3   Referral Orders  No referral(s) requested today     Note is dictated utilizing voice recognition software. Although note has been proof read prior to signing, occasional typographical errors still can be missed. If any questions arise, please do not hesitate to call for verification.  Electronically signed by: Howard Pouch, DO [place]

## 2021-05-30 ENCOUNTER — Telehealth: Payer: Self-pay | Admitting: Family Medicine

## 2021-05-30 LAB — CBC WITH DIFFERENTIAL/PLATELET
Absolute Monocytes: 673 cells/uL (ref 200–950)
Basophils Absolute: 30 cells/uL (ref 0–200)
Basophils Relative: 0.4 %
Eosinophils Absolute: 222 cells/uL (ref 15–500)
Eosinophils Relative: 3 %
HCT: 46.5 % (ref 38.5–50.0)
Hemoglobin: 15.8 g/dL (ref 13.2–17.1)
Lymphs Abs: 1362 cells/uL (ref 850–3900)
MCH: 30.4 pg (ref 27.0–33.0)
MCHC: 34 g/dL (ref 32.0–36.0)
MCV: 89.4 fL (ref 80.0–100.0)
MPV: 10.8 fL (ref 7.5–12.5)
Monocytes Relative: 9.1 %
Neutro Abs: 5113 cells/uL (ref 1500–7800)
Neutrophils Relative %: 69.1 %
Platelets: 265 10*3/uL (ref 140–400)
RBC: 5.2 10*6/uL (ref 4.20–5.80)
RDW: 13.5 % (ref 11.0–15.0)
Total Lymphocyte: 18.4 %
WBC: 7.4 10*3/uL (ref 3.8–10.8)

## 2021-05-30 LAB — COMPREHENSIVE METABOLIC PANEL
AG Ratio: 2 (calc) (ref 1.0–2.5)
ALT: 16 U/L (ref 9–46)
AST: 17 U/L (ref 10–35)
Albumin: 4.4 g/dL (ref 3.6–5.1)
Alkaline phosphatase (APISO): 88 U/L (ref 35–144)
BUN: 12 mg/dL (ref 7–25)
CO2: 27 mmol/L (ref 20–32)
Calcium: 9.8 mg/dL (ref 8.6–10.3)
Chloride: 102 mmol/L (ref 98–110)
Creat: 0.98 mg/dL (ref 0.70–1.25)
Globulin: 2.2 g/dL (calc) (ref 1.9–3.7)
Glucose, Bld: 88 mg/dL (ref 65–99)
Potassium: 4.4 mmol/L (ref 3.5–5.3)
Sodium: 139 mmol/L (ref 135–146)
Total Bilirubin: 1.7 mg/dL — ABNORMAL HIGH (ref 0.2–1.2)
Total Protein: 6.6 g/dL (ref 6.1–8.1)

## 2021-05-30 LAB — PSA: PSA: 1.35 ng/mL (ref <=4.00)

## 2021-05-30 LAB — LIPID PANEL
Cholesterol: 179 mg/dL (ref <200)
HDL: 57 mg/dL (ref >=40)
LDL Cholesterol (Calc): 103 mg/dL (calc) — ABNORMAL HIGH
Non-HDL Cholesterol (Calc): 122 mg/dL (calc) (ref <130)
Total CHOL/HDL Ratio: 3.1 (calc) (ref <5.0)
Triglycerides: 94 mg/dL (ref <150)

## 2021-05-30 LAB — HEMOGLOBIN A1C
Hgb A1c MFr Bld: 5.8 % of total Hgb — ABNORMAL HIGH (ref <5.7)
Mean Plasma Glucose: 120 mg/dL
eAG (mmol/L): 6.6 mmol/L

## 2021-05-30 LAB — URIC ACID: Uric Acid, Serum: 8 mg/dL (ref 4.0–8.0)

## 2021-05-30 LAB — TSH: TSH: 2.91 mIU/L (ref 0.40–4.50)

## 2021-05-30 MED ORDER — ALLOPURINOL 100 MG PO TABS
100.0000 mg | ORAL_TABLET | Freq: Every day | ORAL | 1 refills | Status: DC
Start: 2021-05-30 — End: 2022-01-09

## 2021-05-30 NOTE — Addendum Note (Signed)
Addended by: Howard Pouch A on: 05/30/2021 12:22 PM   Modules accepted: Orders

## 2021-05-30 NOTE — Telephone Encounter (Signed)
Spoke with pt regarding labs and instructions.   

## 2021-05-30 NOTE — Telephone Encounter (Signed)
Please call patient Liver, kidney and thyroid function are normal Blood cell counts and electrolytes are normal Diabetes screening/A1c is normal  Cholesterol panel is at goal.  Uric acid level is at the highest end of normal at 8.0.  I do believe he could benefit from starting allopurinol daily which helps suppress uric acid levels-goal being less than a level of 6 would be beneficial to him, and help prevent further gouty tophi production.  If he is agreeable to this, I will call in allopurinol and then we would need to see him back in 4 weeks to recheck uric acid levels to ensure he is at goal.  Please advise of his decision

## 2021-08-01 ENCOUNTER — Encounter: Payer: PRIVATE HEALTH INSURANCE | Admitting: Internal Medicine

## 2021-08-05 ENCOUNTER — Encounter: Payer: Self-pay | Admitting: Family Medicine

## 2021-08-05 ENCOUNTER — Telehealth (INDEPENDENT_AMBULATORY_CARE_PROVIDER_SITE_OTHER): Payer: 59 | Admitting: Family Medicine

## 2021-08-05 ENCOUNTER — Other Ambulatory Visit: Payer: Self-pay

## 2021-08-05 DIAGNOSIS — U071 COVID-19: Secondary | ICD-10-CM

## 2021-08-05 NOTE — Patient Instructions (Signed)
10 Things You Can Do to Manage Your COVID-19 Symptoms at Home If you have possible or confirmed COVID-19 Stay home except to get medical care. Monitor your symptoms carefully. If your symptoms get worse, call your healthcare provider immediately. Get rest and stay hydrated. If you have a medical appointment, call the healthcare provider ahead of time and tell them that you have or may have COVID-19. For medical emergencies, call 911 and notify the dispatch personnel that you have or may have COVID-19. Cover your cough and sneezes with a tissue or use the inside of your elbow. Wash your hands often with soap and water for at least 20 seconds or clean your hands with an alcohol-based hand sanitizer that contains at least 60% alcohol. As much as possible, stay in a specific room and away from other people in your home. Also, you should use a separate bathroom, if available. If you need to be around other people in or outside of the home, wear a mask. Avoid sharing personal items with other people in your household, like dishes, towels, and bedding. Clean all surfaces that are touched often, like counters, tabletops, and doorknobs. Use household cleaning sprays or wipes according to the label instructions. cdc.gov/coronavirus 07/13/2020 This information is not intended to replace advice given to you by your health care provider. Make sure you discuss any questions you have with your healthcare provider. Document Revised: 02/01/2021 Document Reviewed: 02/01/2021 Elsevier Patient Education  2022 Elsevier Inc.  

## 2021-08-05 NOTE — Progress Notes (Signed)
VIRTUAL VISIT VIA VIDEO  I connected with Johnathan [name] on 08/05/21 at 11:00 AM EDT by elemedicine application and verified that I am speaking with the correct person using two identifiers. Location patient: Home Location provider: [facility], Office Persons participating in the virtual visit: Patient, Dr. Raoul Pitch and Darnell Level. Johnathan [name], CMA  I discussed the limitations of evaluation and management by telemedicine and the availability of in person appointments. The patient expressed understanding and agreed to proceed.   SUBJECTIVE Chief Complaint  Patient presents with   Covid Positive    Pt c/o mild cough, nasal drainage, sore throat, HA and fatigue x ; pt tested pos on     HPI: Johnathan [name] is a 65 y.o. male present for covid illness.  Tested positive Saturday. Symptoms started Thursday night.  Exposure:unknown.  Vaccine:pfizer x3 Pt endorses mild cough, nasal congestion and drainage, sore throat, headache and fatigue.  He denies shortness of breath, fever, nausea or vomit.  He states his symptoms are almost completely gone and he only has a mild headache today and some fatigue. Otc: Nothing GFR: > 90 Anticoag: N/A he is on Tikosyn and statin ROS: See pertinent positives and negatives per HPI.  Patient Active Problem List   Diagnosis Date Noted   Rosanna Randy disease 10/20/2019   Tinnitus aurium, right 03/29/2019   BPH associated with nocturia 12/20/2018   Obesity (BMI 30-39.9) 03/22/2018   OSA on CPAP 03/22/2018   Primary osteoarthritis of first carpometacarpal joint of right hand 05/07/2017   Permanent atrial fibrillation ([place]) 04/15/2017   Chronic pain of right thumb 04/15/2017   Anxiety 04/15/2017   Long-term use of high-risk medication 04/15/2017   Hyperlipidemia    Atypical atrial flutter ([place]) 09/10/2015    Social History   Tobacco Use   Smoking status: Never   Smokeless tobacco: Former    Types: Chew  Substance Use Topics   Alcohol use: Yes    Alcohol/week:  10.0 standard drinks    Types: 10 Cans of beer per week    Comment: social    Current Outpatient Medications:    allopurinol (ZYLOPRIM) 100 MG tablet, Take 1 tablet (100 mg total) by mouth daily., Disp: 90 tablet, Rfl: 1   aspirin (ASPIRIN CHILDRENS) 81 MG chewable tablet, Chew 1 tablet (81 mg total) by mouth 2 (two) times daily. For DVT prophylaxis after surgery, Disp: 84 tablet, Rfl: 0   atorvastatin (LIPITOR) 40 MG tablet, Take 1 tablet (40 mg total) by mouth daily., Disp: 90 tablet, Rfl: 3   dofetilide (TIKOSYN) 500 MCG capsule, Take 1 capsule (500 mcg total) by mouth 2 (two) times daily. NEEDS APPOINTMENT FOR FUTURE REFILLS, Disp: 60 capsule, Rfl: 8   escitalopram (LEXAPRO) 5 MG tablet, Take 1 tablet (5 mg total) by mouth daily., Disp: 90 tablet, Rfl: 1  Allergies  Allergen Reactions   Sulfa Antibiotics     Unknown reaction in childhood    OBJECTIVE: There were no vitals taken for this visit. Gen: No acute distress. Nontoxic in appearance.  HENT: AT. [place].  MMM.  Eyes:Pupils Equal Round Reactive to light, Extraocular movements intact,  Conjunctiva without redness, discharge or icterus. Chest: Cough not present.  Shortness of breath not present. Skin: No rashes, purpura or petechiae.  Neuro: Alert. Oriented x3  Psych: Normal affect and demeanor. Normal speech. Normal thought content and judgment.  ASSESSMENT AND PLAN: Johnathan [name] is a 65 y.o. male present for  COVID-19/headache Patient is almost symptom-free on day 5 of illness.  I would not recommend antiviral at this time since his symptoms are so greatly improved already. Continue to rest and hydrate. Monitor for any post-COVID bronchitis or sinusitis. Advil/Tylenol as tolerated for headache Reviewed home care instructions for COVID. Advised self-isolation at home for at least 5 days. After 5 days, if improved and fever resolved, can be in public, but should wear a mask around others for an additional 5 days. If symptoms,  esp, dyspnea develops/worsens, recommend in-person evaluation at either an urgent care or the emergency room.    Howard Pouch, DO 08/05/2021   No follow-ups on file.  No orders of the defined types were placed in this encounter.  No orders of the defined types were placed in this encounter.  Referral Orders  No referral(s) requested today

## 2021-08-27 ENCOUNTER — Other Ambulatory Visit: Payer: Self-pay | Admitting: Cardiovascular Disease

## 2021-09-21 ENCOUNTER — Other Ambulatory Visit: Payer: Self-pay | Admitting: Cardiovascular Disease

## 2021-10-21 ENCOUNTER — Other Ambulatory Visit: Payer: Self-pay | Admitting: Cardiovascular Disease

## 2021-11-16 ENCOUNTER — Other Ambulatory Visit: Payer: Self-pay | Admitting: Cardiovascular Disease

## 2021-11-20 ENCOUNTER — Other Ambulatory Visit: Payer: Self-pay

## 2021-11-20 DIAGNOSIS — F419 Anxiety disorder, unspecified: Secondary | ICD-10-CM

## 2021-11-20 MED ORDER — ESCITALOPRAM OXALATE 5 MG PO TABS: 5.0000 mg | ORAL_TABLET | Freq: Every day | ORAL | 0 refills | Status: DC

## 2021-12-16 ENCOUNTER — Other Ambulatory Visit: Payer: Self-pay | Admitting: Cardiovascular Disease

## 2021-12-26 ENCOUNTER — Telehealth: Payer: Self-pay | Admitting: Family Medicine

## 2021-12-26 DIAGNOSIS — F419 Anxiety disorder, unspecified: Secondary | ICD-10-CM

## 2021-12-26 MED ORDER — ESCITALOPRAM OXALATE 5 MG PO TABS: 5.0000 mg | ORAL_TABLET | Freq: Every day | ORAL | 0 refills | Status: DC

## 2021-12-26 NOTE — Telephone Encounter (Signed)
Rx sent 

## 2021-12-26 NOTE — Telephone Encounter (Signed)
Caller Name: [name], self Call back phone #: [phone]  MEDICATION(S): escitalopram (LEXAPRO) 5 MG tablet 1 day left  Has the patient contacted their pharmacy? Yes - advised to call MD - pt scheduled next available appt 01/09/2022 9am  Preferred Pharmacy:  [id number] - [place], [place]. AT [address] Phone:  [phone]  Fax:  [phone]

## 2022-01-09 ENCOUNTER — Ambulatory Visit: Payer: 59 | Admitting: Family Medicine

## 2022-01-09 ENCOUNTER — Telehealth: Payer: Self-pay | Admitting: Family Medicine

## 2022-01-09 ENCOUNTER — Encounter: Payer: Self-pay | Admitting: Family Medicine

## 2022-01-09 ENCOUNTER — Other Ambulatory Visit: Payer: Self-pay

## 2022-01-09 VITALS — BP 127/76 | HR 63 | Temp 98.7°F | Ht 73.0 in | Wt 230.0 lb

## 2022-01-09 DIAGNOSIS — E7800 Pure hypercholesterolemia, unspecified: Secondary | ICD-10-CM | POA: Diagnosis not present

## 2022-01-09 DIAGNOSIS — R351 Nocturia: Secondary | ICD-10-CM

## 2022-01-09 DIAGNOSIS — L918 Other hypertrophic disorders of the skin: Secondary | ICD-10-CM | POA: Insufficient documentation

## 2022-01-09 DIAGNOSIS — F419 Anxiety disorder, unspecified: Secondary | ICD-10-CM | POA: Diagnosis not present

## 2022-01-09 DIAGNOSIS — N401 Enlarged prostate with lower urinary tract symptoms: Secondary | ICD-10-CM | POA: Diagnosis not present

## 2022-01-09 HISTORY — DX: Other hypertrophic disorders of the skin: L91.8

## 2022-01-09 MED ORDER — ATORVASTATIN CALCIUM 40 MG PO TABS: 40.0000 mg | ORAL_TABLET | Freq: Every day | ORAL | 3 refills | Status: DC

## 2022-01-09 MED ORDER — ESCITALOPRAM OXALATE 5 MG PO TABS: 5.0000 mg | ORAL_TABLET | Freq: Every day | ORAL | 1 refills | Status: DC

## 2022-01-09 MED ORDER — TAMSULOSIN HCL 0.4 MG PO CAPS: 0.4000 mg | ORAL_CAPSULE | Freq: Every day | ORAL | 5 refills | Status: DC

## 2022-01-09 NOTE — Telephone Encounter (Signed)
Please call patient He was seen today for his chronic medical conditions and we talked about starting Flomax for his urinary symptoms. There is a slight chance if he had a true sulfa allergy, which it does not seem like he did or he would have known the reaction he had when he was a kid, that he could have a reaction to Flomax.  I did go ahead and call in the Flomax for him to start.  I just wanted to caution him if he develops a rash, hives, swelling of the lips or face he is to stop the Flomax immediately and take Benadryl 50 mg.  People with a true sulfa allergy rarely have a reaction to Flomax, and less it was a severe allergy such as anaphylaxis.  I just wanted him to be aware so he could make an educated decision on proceeding with medication.

## 2022-01-09 NOTE — Progress Notes (Signed)
This visit occurred during the SARS-CoV-2 public health emergency.  Safety protocols were in place, including screening questions prior to the visit, additional usage of staff PPE, and extensive cleaning of exam room while observing appropriate contact time as indicated for disinfecting solutions.    Patient ID: Johnathan [name], male  DOB: 22-Apr-1956, 66 y.o.   MRN: [id number] Patient Care Team    Relationship Specialty Notifications Start End  Johnathan Hillock, DO PCP - General Family Medicine  04/15/17   Johnathan Harp, MD PCP - Cardiology Cardiology  12/21/19   Johnathan [name] PCP - Pulmonology Family Medicine  12/21/19   Johnathan Harp, MD Consulting Physician Cardiology  03/22/18   Johnathan Shipper, MD Consulting Physician Gastroenterology  07/19/19   Johnathan Gentry, MD Consulting Physician Sports Medicine  03/07/20   Johnathan Gash, MD Consulting Physician Orthopedic Surgery  03/07/20   Johnathan Sine, MD Consulting Physician Cardiology  04/02/20     Chief Complaint  Patient presents with   Anxiety    [place]; pt is fasting    Subjective:  Johnathan [name] is a 66 y.o. male present for [place]. All past medical history, surgical history, allergies, family history, immunizations, medications and social history were updated in the electronic medical record today. All recent labs, ED visits and hospitalizations within the last year were reviewed.  Anxiety: Pt feels anxiety is well controlled on lexapro 5 mg qd. He may want to eventually try to take less, but he gets a headache when he stops use.   A.fib/HLD: Follows with cardio for a.fib. compliant with statin.   Nocturia: Patient reports he awakens approximately 3 times a night to urinate.  This is becoming bothersome to him.  His PSAs have been normal.  He is wondering if there are medications to help him with the nocturia.  Depression screen [facility] 2/9 01/09/2022 05/29/2021 08/29/2020 07/19/2019 12/20/2018  Decreased Interest 0 0 0 0 0  Down,  Depressed, Hopeless 0 0 0 0 0  PHQ - 2 Score 0 0 0 0 0  Altered sleeping 1 - - 0 0  Tired, decreased energy 0 - - 0 0  Change in appetite 0 - - 0 0  Feeling bad or failure about yourself  0 - - 0 0  Trouble concentrating 0 - - 0 0  Moving slowly or fidgety/restless 0 - - 0 0  Suicidal thoughts 0 - - 0 0  PHQ-9 Score 1 - - 0 0   GAD 7 : Generalized Anxiety Score 01/09/2022 03/07/2020 07/19/2019 12/20/2018  Nervous, Anxious, on Edge 0 0 0 0  Control/stop worrying 0 0 0 0  Worry too much - different things 0 0 0 0  Trouble relaxing 0 0 0 0  Restless 0 0 0 0  Easily annoyed or irritable 0 0 0 0  Afraid - awful might happen 0 0 0 0  Total GAD 7 Score 0 0 0 0  Anxiety Difficulty - Not difficult at all - -     Fall Risk  01/09/2022 05/29/2021 08/29/2020 07/19/2019 03/22/2018  Falls in the past year? 0 0 0 0 No  Number falls in past yr: 0 0 0 - -  Injury with Fall? 0 0 0 - -  Risk for fall due to : No Fall Risks - - - -  Follow up Falls evaluation completed Falls evaluation completed Falls evaluation completed Falls evaluation completed -   Immunization History  Administered Date(s) Administered  Influenza Split 10/21/2017   Influenza,inj,Quad PF,6+ Mos 12/20/2018, 08/26/2019   Influenza-Unspecified 09/28/2021   PFIZER(Purple Top)SARS-COV-2 Vaccination 01/23/2020, 02/18/2020, 11/01/2020   PNEUMOCOCCAL CONJUGATE-20 05/29/2021   Tdap 03/22/2018   Zoster Recombinat (Shingrix) 07/26/2019, 03/07/2020   Past Medical History:  Diagnosis Date   Arthritis    hands and feet   Dysrhythmia    a-fib   Elevated LFTs    Hearing aid worn 2021   right   Hyperlipidemia    Persistent atrial fibrillation (HCC)    RLS (restless legs syndrome)    Sleep apnea    does not use CPAP   Allergies  Allergen Reactions   Sulfa Antibiotics     Unknown reaction in childhood   Past Surgical History:  Procedure Laterality Date   ATRIAL FIBRILLATION ABLATION  2015   BONE EXCISION Right 05/09/2021    Procedure: RIGHT TIBIA EXCISION;  Surgeon: Johnathan Gash, MD;  Location: [place];  Service: Orthopedics;  Laterality: Right;   COLONOSCOPY     EXCISION MORTON'S NEUROMA Right 05/09/2021   Procedure: EXCISION OF RIGHT SECOND TOE MASS;  Surgeon: Erle Crocker, MD;  Location: [place];  Service: Orthopedics;  Laterality: Right;   FOOT SURGERY Left    HERNIA REPAIR     LUMBAR [place] SURGERY  2012   L3-L4, No hardware   Family History  Problem Relation Age of Onset   Diabetes Mother    Diabetes Brother    Early death Brother    Heart disease Brother    Colon cancer Neg Hx    Esophageal cancer Neg Hx    Rectal cancer Neg Hx    Stomach cancer Neg Hx    Liver disease Neg Hx    Pancreatic cancer Neg Hx    Social History   Social History Narrative   Married to [place]. 2 children [place].    BS degree.    Wear seatbelt. Wears a bicycle helmet. Smoke detector in the home.   Firearms in the home.   Feels safe in  Relationships.   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 If you have any questions, ask your nurse or doctor.          STOP taking these medications    allopurinol 100 MG tablet Commonly known as: ZYLOPRIM Stopped by: Howard Pouch, DO       TAKE these medications    aspirin 81 MG chewable tablet Commonly known as: Aspirin Childrens Chew 1 tablet (81 mg total) by mouth 2 (two) times daily. For DVT prophylaxis after surgery   atorvastatin 40 MG tablet Commonly known as: LIPITOR Take 1 tablet (40 mg total) by mouth daily.   dofetilide 500 MCG capsule Commonly known as: TIKOSYN TAKE 1 CAPSULE BY MOUTH TWICE A DAY *APPPOINTMENT NEEDED FOR MORE REFILLS*    escitalopram 5 MG tablet Commonly known as: LEXAPRO Take 1 tablet (5 mg total) by mouth daily.   tamsulosin 0.4 MG Caps capsule Commonly known as: FLOMAX Take 1 capsule (0.4 mg total) by mouth daily. Started by: Howard Pouch, DO  All past medical history, surgical history, allergies, family history, immunizations andmedications were updated in the EMR today and reviewed under the history and medication portions of their EMR.     No results found.  ROS: 14 pt review of systems performed and negative (unless mentioned in an HPI)  Objective: BP 127/76    Pulse 63    Temp 98.7 F (37.1 C) (Oral)    Ht 6\' 1"  (1.854 m)    Wt 230 lb (104.3 kg)    SpO2 97%    BMI 30.34 kg/m  Physical Exam Vitals and nursing note reviewed.  Constitutional:      General: He is not in acute distress.    Appearance: Normal appearance. He is not ill-appearing, toxic-appearing or diaphoretic.  HENT:     Head: Normocephalic and atraumatic.  Eyes:     General: No scleral icterus.       Right eye: No discharge.        Left eye: No discharge.     Extraocular Movements: Extraocular movements intact.     Pupils: Pupils are equal, round, and reactive to light.  Cardiovascular:     Rate and Rhythm: Normal rate and regular rhythm.  Pulmonary:     Effort: Pulmonary effort is normal.     Breath sounds: Normal breath sounds.  Lymphadenopathy:     Cervical: No cervical adenopathy.  Skin:    General: Skin is warm and dry.     Coloration: Skin is not jaundiced or pale.     Findings: No rash.     Comments: Approximately 10-15 small skin tags, flesh toned along neck line present.   Neurological:     Mental Status: He is alert and oriented to person, place, and time. Mental status is at baseline.  Psychiatric:        Mood and Affect: Mood normal.        Behavior: Behavior normal.        Thought Content: Thought content normal.        Judgment: Judgment normal.     No results  found.  Assessment/plan: Eh Sesay is a 66 y.o. male present for [facility] Permanent atrial fibrillation ([place]) Stable meds managed by cardio Encouraged baby ASA restart  Anxiety stable Discussed tapering down/off and provided instructions for him to do so if desired.  - continue (LEXAPRO) 5 MG tablet; Take 1 tablet (5 mg total) by mouth daily.  Dispense: 90 tablet; Refill: 1 If refills requested, f/u after 5.5 mos.   Mixed hyperlipidemia - stable - continue atorvastatin (LIPITOR) 40 MG tablet; Take 1 tablet (40 mg total) by mouth daily.  Dispense: 90 tablet; Refill: 3  Cutaneous skin tags Agreed to remove skin tags for him if desired.  He is to make a separate appointment to have this completed. Skin tag removal-procedure appointment in 1130 slot required.  BPH associated with nocturia Nocturia becoming more persistent of at least 3 times a night now. Encouraged him to monitor/limit the fluid consumed after 7 PM. He would like to try Flomax.  This was called in for him with caution on a reported possible sulfa allergy.  He stated before and it is in the record, they his symptoms were mild and he is not certain what they were because he was a child.  He has not had any issues in life since. Educated him people with true sulfa allergies rarely have a reaction to Flomax, but it can occur if he had a severe reaction  to sulfa in the past. He was encouraged that if he does decide to start Flomax to keep Benadryl 50 mg on him in the event he would have a reaction-of course seek immediate attention if any swelling of the face, lips or difficulty breathing.   Return for has apptscheduled for May 30, 2022.  No orders of the defined types were placed in this encounter.  Meds ordered this encounter  Medications   atorvastatin (LIPITOR) 40 MG tablet    Sig: Take 1 tablet (40 mg total) by mouth daily.    Dispense:  90 tablet    Refill:  3   escitalopram (LEXAPRO) 5 MG tablet    Sig: Take 1  tablet (5 mg total) by mouth daily.    Dispense:  90 tablet    Refill:  1   tamsulosin (FLOMAX) 0.4 MG CAPS capsule    Sig: Take 1 capsule (0.4 mg total) by mouth daily.    Dispense:  30 capsule    Refill:  5    Not a true sulfa allergy   Referral Orders  No referral(s) requested today     Note is dictated utilizing voice recognition software. Although note has been proof read prior to signing, occasional typographical errors still can be missed. If any questions arise, please do not hesitate to call for verification.  Electronically signed by: Howard Pouch, DO [place]

## 2022-01-09 NOTE — Telephone Encounter (Signed)
Spoke with pt regarding providers instructions on new medication. Pt state that issue is not "that bad" just wanted to make sure there was no reason to worry. Pt decline medication for now.

## 2022-01-09 NOTE — Patient Instructions (Signed)
Great to see you today.  I have refilled the medication(s) we provide.   If labs were collected, we will inform you of lab results once received either by echart message or telephone call.   - echart message- for normal results that have been seen by the patient already.   - telephone call: abnormal results or if patient has not viewed results in their echart.  

## 2022-01-09 NOTE — Telephone Encounter (Signed)
noted 

## 2022-01-14 ENCOUNTER — Encounter: Payer: Self-pay | Admitting: Family Medicine

## 2022-01-14 ENCOUNTER — Other Ambulatory Visit: Payer: Self-pay

## 2022-01-14 ENCOUNTER — Ambulatory Visit: Payer: 59 | Admitting: Family Medicine

## 2022-01-14 VITALS — BP 123/73 | HR 56 | Temp 97.8°F | Ht 73.0 in | Wt 233.2 lb

## 2022-01-14 DIAGNOSIS — L918 Other hypertrophic disorders of the skin: Secondary | ICD-10-CM

## 2022-01-14 NOTE — Progress Notes (Signed)
This visit occurred during the SARS-CoV-2 public health emergency.  Safety protocols were in place, including screening questions prior to the visit, additional usage of staff PPE, and extensive cleaning of exam room while observing appropriate contact time as indicated for disinfecting solutions.    Johnathan [name] , December 01, 1956, 66 y.o., male MRN: [id number] Patient Care Team    Relationship Specialty Notifications Start End  Ma Hillock, DO PCP - General Family Medicine  04/15/17   Lorretta Harp, MD PCP - Cardiology Cardiology  12/21/19   Con Memos PCP - Pulmonology Family Medicine  12/21/19   Lorretta Harp, MD Consulting Physician Cardiology  03/22/18   Irene Shipper, MD Consulting Physician Gastroenterology  07/19/19   Dene Gentry, MD Consulting Physician Sports Medicine  03/07/20   Hiram Gash, MD Consulting Physician Orthopedic Surgery  03/07/20   Troy Sine, MD Consulting Physician Cardiology  04/02/20     Chief Complaint  Patient presents with   Procedure    Skin tag removal     Subjective: Pt presents for an OV today to have 11 skin tags removed from his neckline that have been caught on clothing and cause discomfort.  Depression screen [facility] 2/9 01/09/2022 05/29/2021 08/29/2020 07/19/2019 12/20/2018  Decreased Interest 0 0 0 0 0  Down, Depressed, Hopeless 0 0 0 0 0  PHQ - 2 Score 0 0 0 0 0  Altered sleeping 1 - - 0 0  Tired, decreased energy 0 - - 0 0  Change in appetite 0 - - 0 0  Feeling bad or failure about yourself  0 - - 0 0  Trouble concentrating 0 - - 0 0  Moving slowly or fidgety/restless 0 - - 0 0  Suicidal thoughts 0 - - 0 0  PHQ-9 Score 1 - - 0 0    Allergies  Allergen Reactions   Sulfa Antibiotics     Unknown reaction in childhood   Social History   Social History Narrative   Married to [place]. 2 children [place].    BS degree.    Wear seatbelt. Wears a bicycle helmet. Smoke detector in the home.   Firearms in the  home.   Feels safe in  Relationships.   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;  Service: Orthopedics;  Laterality: Right;   COLONOSCOPY     EXCISION MORTON'S NEUROMA Right 05/09/2021   Procedure: EXCISION OF RIGHT SECOND TOE MASS;  Surgeon: Erle Crocker, MD;  Location: [place];  Service: Orthopedics;  Laterality: Right;   FOOT SURGERY Left    HERNIA REPAIR     LUMBAR [place] SURGERY  2012   L3-L4, No hardware   Family History  Problem Relation  Age of Onset   Diabetes Mother    Diabetes Brother    Early death Brother    Heart disease Brother    Colon cancer Neg Hx    Esophageal cancer Neg Hx    Rectal cancer Neg Hx    Stomach cancer Neg Hx    Liver disease Neg Hx    Pancreatic cancer Neg Hx    Allergies as of 01/14/2022       Reactions   Sulfa Antibiotics    Unknown reaction in childhood        Medication List        Accurate as of January 14, 2022 12:12 PM. If you have any questions, ask your nurse or doctor.          allopurinol 100 MG tablet Commonly known as: ZYLOPRIM Take 100 mg by mouth daily.   aspirin 81 MG chewable tablet Commonly known as: Aspirin Childrens Chew 1 tablet (81 mg total) by mouth 2 (two) times daily. For DVT prophylaxis after surgery   atorvastatin 40 MG tablet Commonly known as: LIPITOR Take 1 tablet (40 mg total) by mouth daily.    dofetilide 500 MCG capsule Commonly known as: TIKOSYN TAKE 1 CAPSULE BY MOUTH TWICE A DAY *APPPOINTMENT NEEDED FOR MORE REFILLS*   escitalopram 5 MG tablet Commonly known as: LEXAPRO Take 1 tablet (5 mg total) by mouth daily.   tamsulosin 0.4 MG Caps capsule Commonly known as: FLOMAX Take 1 capsule (0.4 mg total) by mouth daily.        All past medical history, surgical history, allergies, family history, immunizations andmedications were updated in the EMR today and reviewed under the history and medication portions of their EMR.     ROS Negative, with the exception of above mentioned in HPI   Objective:  BP 123/73 (BP Location: Left Arm, Patient Position: Sitting, Cuff Size: Normal)    Pulse (!) 56    Temp 97.8 F (36.6 C) (Oral)    Ht 6\' 1"  (1.854 m)    Wt 233 lb 3.2 oz (105.8 kg)    SpO2 100%    BMI 30.77 kg/m  Body mass index is 30.77 kg/m.  Physical Exam Gen: Afebrile. No acute distress. Nontoxic in appearance, well developed, well nourished.  Skin: x11 flesh toned skin tags surrounding anterior neck line.  No rashes, purpura or petechiae.  Neuro: Normal gait. PERLA. EOMi. Alert. Oriented x3  Psych: Normal affect, dress and demeanor. Normal speech. Normal thought content and judgment.  No results found. No results found. No results found for this or any previous visit (from the past 24 hour(s)).  Assessment/Plan: Curlee Bogan is a 66 y.o. male present for OV for  Cutaneous skin tags The x11 skin tags was performed on the anterior neck line. The patient was consented for removal and explained the potential complications, instructions as to how the procedure will be performed, and postoperative instructions were given to the patient.  Intent: To remove skin tag to prevent further irritation and discomfort Location: Anterior neck line PROCEDURE: The anterior neck was cleaned with antiseptic.  Ethyl chloride spray anesthetic per site.  A 11 blade was used to remove  each skin tag.   The site was then checked for bleeding. Once hemostasis was achieved, a local antibiotic ointment was placed and a pressure dressing applied. The patient was further instructed to keep the site completely dry for the next 24 hours, after which a new dressing and ointment should be applied  to the area. They were further instructed to avoid getting the site dirty or infected. The patient completed the procedure without any complications and tolerated the the procedure well.  The biopsy will not be sent for analysis.   Reviewed expectations re: course of current medical issues. Discussed self-management of symptoms. Outlined signs and symptoms indicating need for more acute intervention. Patient verbalized understanding and all questions were answered. Patient received an After-Visit Summary.    No orders of the defined types were placed in this encounter.  No orders of the defined types were placed in this encounter.  Referral Orders  No referral(s) requested today     Note is dictated utilizing voice recognition software. Although note has been proof read prior to signing, occasional typographical errors still can be missed. If any questions arise, please do not hesitate to call for verification.   electronically signed by:  Howard Pouch, DO  [place]

## 2022-01-14 NOTE — Patient Instructions (Signed)
Keep areas clean and dry.  They will scab over.  May use a little A&D ointment or bag balm until healed.   Skin Tag, Adult A skin tag (acrochordon) is a soft, extra growth of skin. Most skin tags are skin-colored and rarely bigger than a pencil eraser. They commonly form in areas where there is frequent rubbing, or friction, on the skin. This may be where there are folds in the skin, such as the eyelids, neck, armpit, or groin. Skin tags are not dangerous, and they do not spread from person to person (are not contagious). You may have one skin tag or several. Skin tags do not require treatment. However, your health care provider may recommend removal of a skin tag if it: Gets irritated from clothing or jewelry. Bleeds. Is visible and unsightly. What are the causes? This condition is linked with: Increasing age. Pregnancy. Diabetes. Obesity. What are the signs or symptoms? Skin tags usually do not cause symptoms unless they get irritated by items touching your skin, such as clothing or jewelry. When this happens, you may have pain, itching, or bleeding. How is this diagnosed? This condition is diagnosed with an evaluation from your health care provider. No testing is needed for diagnosis. How is this treated? Treatment for this condition depends on whether you have symptoms. If a skin tag needs to be removed, your health care provider can remove it with: A simple surgical procedure using scissors. A procedure that involves freezing your skin tag with a gas in liquid form (liquid nitrogen). A procedure that uses heat to destroy your skin tag (electrodessication). Your health care provider may also remove your skin tag if it is visible or unsightly, Follow these instructions at home: Watch for any changes in your skin tag. A normal skin tag does not require any other special care at home. Take over-the-counter and prescription medicines only as told by your health care provider. Keep all  follow-up visits as told by your health care provider. This is important. Contact a health care provider if: You have a skin tag that: Becomes painful. Changes color. Bleeds. Swells. Summary Skin tags are soft, extra growths of skin found in areas of frequent rubbing or friction. Skin tags usually do not cause symptoms. If symptoms occur, you may have pain, itching, or bleeding. If your skin tag causes symptoms or is unsightly, your health care provider can remove it. This information is not intended to replace advice given to you by your health care provider. Make sure you discuss any questions you have with your health care provider. Document Revised: 10/17/2019 Document Reviewed: 10/17/2019 Elsevier Patient Education  [place].

## 2022-01-19 ENCOUNTER — Other Ambulatory Visit: Payer: Self-pay | Admitting: Cardiovascular Disease

## 2022-03-18 ENCOUNTER — Other Ambulatory Visit: Payer: Self-pay

## 2022-03-18 ENCOUNTER — Ambulatory Visit: Payer: 59 | Admitting: Cardiovascular Disease

## 2022-03-18 ENCOUNTER — Encounter: Payer: Self-pay | Admitting: Cardiovascular Disease

## 2022-03-18 VITALS — BP 138/82 | HR 57 | Ht 73.0 in | Wt 233.4 lb

## 2022-03-18 DIAGNOSIS — G4733 Obstructive sleep apnea (adult) (pediatric): Secondary | ICD-10-CM | POA: Diagnosis not present

## 2022-03-18 DIAGNOSIS — Z9989 Dependence on other enabling machines and devices: Secondary | ICD-10-CM | POA: Diagnosis not present

## 2022-03-18 DIAGNOSIS — E782 Mixed hyperlipidemia: Secondary | ICD-10-CM

## 2022-03-18 DIAGNOSIS — I4821 Permanent atrial fibrillation: Secondary | ICD-10-CM | POA: Diagnosis not present

## 2022-03-18 NOTE — Patient Instructions (Signed)

## 2022-03-18 NOTE — Assessment & Plan Note (Signed)
History of atrial fibrillation in the past status post A-fib ablation by Dr. Tally Due at [facility] remotely.  He has had several cardioversions the most recent one was at [facility] 5 or 6 years ago by Dr. Mancel Bale on.  He is currently not on oral anticoagulation.  He said no recurrent episodes.  His triggers are alcohol and dehydration.  He does exercise. ?

## 2022-03-18 NOTE — Assessment & Plan Note (Signed)
History of obstructive sleep apnea currently not wearing CPAP. °

## 2022-03-18 NOTE — Assessment & Plan Note (Signed)
History of hyperlipidemia on statin therapy with lipid profile performed 05/29/2021 revealing total cholesterol 179, LDL 103 and HDL of 57. ?

## 2022-03-18 NOTE — Progress Notes (Signed)
? ? ? ?03/18/2022 ?Johnathan [name]   ?Dec 24, 1956  ?[id number] ? ?Primary Physician Johnathan Pouch A, DO ?Primary Cardiologist: Johnathan [name] Johnathan [name] ? ?HPI:  Johnathan [name] is [name] 66 y.o.  mildly overweight married Caucasian male father of 2 children, grandfather of 1 grandchild , who works at [employer] in [employer].   He was referred by Dr. Irma Newness D.O. establish her cardiovascular practice because his prior [name]. fib history.  I last saw him in the office 08/28/2020.[name]  His cardiovascular risk factor profile is notable for treated hyperlipidemia and family history. Brother died of myocardial infarction at age 88. He's never had [name] heart attack or stroke. He denies chest pain or shortness of breathHe had [name]. fib ablation at [facility] 8  years ago by Dr. Tally [name]. He currently is on Tikosyn. He is not on oral anticoagulation. He said 4-5 DC cardioversions in the past since his ablation, the last one being 4  years ago at [name] by Dr. Kelli [name].  ?  ?Since I saw him [name] year and [name] half ago he is remained stable.  He has had 1 brief episodes of PAF which was self-limited.  He remains on Tikosyn.  He is very active and exercises.  He no longer wears his CPAP.  He denies chest pain or shortness of breath. ? ?No outpatient medications have been marked as taking for the 03/18/22 encounter (Office Visit) with Johnathan Harp, [name].  ?  ? ?Allergies  ?Allergen Reactions  ? Sulfa Antibiotics   ?  Unknown reaction in childhood  ? ? ?Social History  ? ?Socioeconomic History  ? Marital status: Married  ?  Spouse name: Johnathan [name]  ? Number of children: 2  ? Years of education: 60  ? Highest education level: Not on file  ?Occupational History  ? Not on file  ?Tobacco Use  ? Smoking status: Never  ? Smokeless tobacco: Former  ?  Types: Chew  ?Vaping Use  ? Vaping Use: Never used  ?Substance and Sexual Activity  ? Alcohol use: Yes  ?  Alcohol/week: 10.0 standard drinks  ?   Types: 10 Cans of beer per week  ?  Comment: social  ? Drug use: No  ? Sexual activity: Yes  ?  Partners: Female  ?  Comment: Married  ?Other Topics Concern  ? Not on file  ?Social History Narrative  ? Married to Johnathan [name]. 2 children [place].   ? BS degree.   ? Wear seatbelt. Wears [name] bicycle helmet. Smoke detector in the home.  ? Firearms in the home.  ? Feels safe in  Relationships.  ? Lives in [place] and works as [name] CEA on [employer]  ? Lives in [place]  ? 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 ?Social Determinants of Health  ? ?Financial Resource Strain: Not on file  ?Food Insecurity: Not on file  ?Transportation Needs: Not on file  ?Physical Activity: Not on file  ?Stress: Not on file  ?Social Connections: Not on file  ?Intimate Partner Violence: Not on file  ?  ? ?Review of Systems: ?General: negative for chills, fever, night sweats or weight changes.  ?Cardiovascular: negative for chest pain, dyspnea on exertion, edema, orthopnea, palpitations, paroxysmal nocturnal dyspnea or shortness of breath ?Dermatological: negative for rash ?Respiratory: negative for cough or wheezing ?Urologic: negative for hematuria ?Abdominal: negative for nausea, vomiting, diarrhea, bright red blood per rectum,  melena, or hematemesis ?Neurologic: negative for visual changes, syncope, or dizziness ?All other systems reviewed and are otherwise negative except as noted above. ? ? ? ?Blood pressure 138/82, pulse (!) 57, height '6\' 1"'$  (1.854 m), weight 233 lb 6.4 oz (105.9 kg), SpO2 98 %.  ?General appearance: alert and no distress ?Neck: no adenopathy, no carotid bruit, no JVD, supple, symmetrical, trachea midline, and thyroid not enlarged, symmetric, no tenderness/mass/nodules ?Lungs: clear to auscultation bilaterally ?Heart: regular rate and rhythm, S1, S2 normal, no murmur, click, rub or gallop ?Extremities: extremities normal, atraumatic, no cyanosis or edema ?Pulses: 2+ and symmetric ?Skin: Skin color,  texture, turgor normal. No rashes or lesions ?Neurologic: Grossly normal ? ?EKG sinus bradycardia 57 without ST or T wave changes.  Personally reviewed this EKG. ? ?ASSESSMENT AND PLAN:  ? ?Hyperlipidemia ?History of hyperlipidemia on statin therapy with lipid profile performed 05/29/2021 revealing total cholesterol 179, LDL 103 and HDL of 57. ? ?OSA on CPAP ?History of obstructive sleep apnea currently not wearing CPAP ? ?Permanent atrial fibrillation ([place]) ?History of atrial fibrillation in the past status post [name]-fib ablation by Dr. Tally [name] at [facility] remotely.  He has had several cardioversions the most recent one was at [facility] 5 or 6 years ago by Dr. Mancel Bale on.  He is currently not on oral anticoagulation.  He said no recurrent episodes.  His triggers are alcohol and dehydration.  He does exercise. ? ? ? ? ?Johnathan [name] FACP,FACC,FAHA, FSCAI ?03/18/2022 ?8:17 AM ?

## 2022-04-25 ENCOUNTER — Other Ambulatory Visit: Payer: Self-pay | Admitting: Cardiovascular Disease

## 2022-05-02 ENCOUNTER — Encounter: Payer: Self-pay | Admitting: Internal Medicine

## 2022-05-30 ENCOUNTER — Encounter: Payer: Self-pay | Admitting: Family Medicine

## 2022-05-30 ENCOUNTER — Telehealth: Payer: Self-pay

## 2022-05-30 NOTE — Telephone Encounter (Signed)
[place] Night - Client Nonclinical Telephone Record  [facility] Client [place] Night - Client Client Site [place] Night Provider [place], [place] Type Call Who Is Calling Patient / Member / Family / Caregiver Caller Name [place] Phone Number [phone] Patient Name Johnathan [name] Patient DOB [birth year] Call Type Message Only Information Provided Reason for Call Request to [facility] Appointment Initial Comment Caller states he was calling to cancel his appt. Patient request to speak to RN No Disp. Time Disposition Final User 05/30/2022 7:01:23 AM General Information Provided Yes Josephine Cables Call Closed By: Josephine Cables Transaction Date/Time: 05/30/2022 6:58:51 AM (ET)

## 2022-05-30 NOTE — Progress Notes (Signed)
Same day cancel for cpe

## 2022-06-04 ENCOUNTER — Telehealth: Payer: Self-pay | Admitting: Cardiovascular Disease

## 2022-06-04 NOTE — Telephone Encounter (Signed)
New Message:      Patient  wants to know if he gets a Botox Injection and Hyaluronic Acid injection will interfere  with his condition

## 2022-06-04 NOTE — Telephone Encounter (Signed)
Spoke with pt. He states he is leaving Friday and would like an answer today or tomorrow. He thanked me for reaching out to him.

## 2022-06-04 NOTE — Telephone Encounter (Signed)
Called pt to give him Dr. Kennon Holter message. He verbalized understanding. No further concerns at this time. He thanked me for calling him back.

## 2022-06-18 ENCOUNTER — Telehealth: Payer: Self-pay | Admitting: Cardiovascular Disease

## 2022-06-18 NOTE — Telephone Encounter (Signed)
Should not have an interaction with Tikosyn

## 2022-06-18 NOTE — Telephone Encounter (Signed)
Do we know what is being treated?  Do not see anything in chart

## 2022-06-18 NOTE — Telephone Encounter (Signed)
Pt is being treated for sore throat and cough. Pt state he wanted to make sure prednisone didn't interact with [employer]

## 2022-06-18 NOTE — Telephone Encounter (Signed)
Pt is returning call to get update on this medication. States he doesn't feel well and hopes to hear something soon.

## 2022-06-18 NOTE — Telephone Encounter (Signed)
Patient called stating they are going to prescribe to him prednisone he wants to make sure it's okay to take.

## 2022-06-18 NOTE — Telephone Encounter (Signed)
Pt updated and verbalized understanding.  

## 2022-06-22 ENCOUNTER — Telehealth: Payer: Self-pay | Admitting: Physician Assistant

## 2022-06-25 ENCOUNTER — Other Ambulatory Visit: Payer: Self-pay | Admitting: Family Medicine

## 2022-06-25 DIAGNOSIS — F419 Anxiety disorder, unspecified: Secondary | ICD-10-CM

## 2022-07-21 ENCOUNTER — Other Ambulatory Visit: Payer: Self-pay | Admitting: Family Medicine

## 2022-07-21 DIAGNOSIS — F419 Anxiety disorder, unspecified: Secondary | ICD-10-CM

## 2022-07-29 ENCOUNTER — Other Ambulatory Visit: Payer: Self-pay | Admitting: Family Medicine

## 2022-07-29 DIAGNOSIS — F419 Anxiety disorder, unspecified: Secondary | ICD-10-CM

## 2022-08-01 ENCOUNTER — Encounter: Payer: Self-pay | Admitting: Family Medicine

## 2022-08-01 ENCOUNTER — Ambulatory Visit: Payer: 59 | Admitting: Family Medicine

## 2022-08-01 VITALS — BP 124/68 | HR 63 | Temp 98.0°F | Ht 73.0 in | Wt 228.0 lb

## 2022-08-01 DIAGNOSIS — I4821 Permanent atrial fibrillation: Secondary | ICD-10-CM

## 2022-08-01 DIAGNOSIS — F419 Anxiety disorder, unspecified: Secondary | ICD-10-CM | POA: Diagnosis not present

## 2022-08-01 DIAGNOSIS — E7800 Pure hypercholesterolemia, unspecified: Secondary | ICD-10-CM | POA: Diagnosis not present

## 2022-08-01 DIAGNOSIS — E782 Mixed hyperlipidemia: Secondary | ICD-10-CM | POA: Diagnosis not present

## 2022-08-01 MED ORDER — ESCITALOPRAM OXALATE 5 MG PO TABS: 5.0000 mg | ORAL_TABLET | Freq: Every day | ORAL | 1 refills | Status: DC

## 2022-08-01 MED ORDER — TAMSULOSIN HCL 0.4 MG PO CAPS: 0.4000 mg | ORAL_CAPSULE | Freq: Every day | ORAL | 1 refills | Status: DC

## 2022-08-01 MED ORDER — ATORVASTATIN CALCIUM 40 MG PO TABS: 40.0000 mg | ORAL_TABLET | Freq: Every day | ORAL | 3 refills | Status: DC

## 2022-08-01 NOTE — Progress Notes (Signed)
Patient ID: Johnathan [name], male  DOB: [birth year], 66 y.o.   MRN: [id number] Patient Care Team    Relationship Specialty Notifications Start End  Ma Hillock, DO PCP - General Family Medicine  04/15/17   Lorretta Harp, MD PCP - Cardiology Cardiology  12/21/19   Con Memos PCP - Pulmonology Family Medicine  12/21/19   Lorretta Harp, MD Consulting Physician Cardiology  03/22/18   Irene Shipper, MD Consulting Physician Gastroenterology  07/19/19   Dene Gentry, MD Consulting Physician Sports Medicine  03/07/20   Hiram Gash, MD Consulting Physician Orthopedic Surgery  03/07/20   Troy Sine, MD Consulting Physician Cardiology  04/02/20     Chief Complaint  Patient presents with   Anxiety    Cmc; pt is not fasting;     Subjective:  Johnathan [name] is a 66 y.o. male present for [facility]. All past medical history, surgical history, allergies, family history, immunizations, medications and social history were updated in the electronic medical record today. All recent labs, ED visits and hospitalizations within the last year were reviewed.  Anxiety: Pt feels anxiety is controlled on lexapro 5 mg qd. He may want to eventually try to take less, but he gets a headache when he stops use.   A.fib/HLD: Follows with cardio for a.fib. compliant with statin and tikosyn  Nocturia: Patient reports he awakens approximately 3-4 times a night to urinate.    His PSAs have been normal.  He was provided with prescription of Flomax about a year ago which he never started.  He does not recall why.  He would like to reconsider and start now.     08/01/2022    4:05 PM 01/09/2022    8:45 AM 05/29/2021    7:59 AM 08/29/2020   10:01 AM 07/19/2019    3:03 PM  Depression screen PHQ 2/9  Decreased Interest 0 0 0 0 0  Down, Depressed, Hopeless 0 0 0 0 0  PHQ - 2 Score 0 0 0 0 0  Altered sleeping 1 1   0  Tired, decreased energy 0 0   0  Change in appetite 0 0   0  Feeling bad or failure about yourself   0 0   0  Trouble concentrating 0 0   0  Moving slowly or fidgety/restless 0 0   0  Suicidal thoughts 0 0   0  PHQ-9 Score 1 1   0      08/01/2022    4:05 PM 01/09/2022    8:53 AM 03/07/2020    3:24 PM 07/19/2019    3:04 PM  GAD 7 : Generalized Anxiety Score  Nervous, Anxious, on Edge 0 0 0 0  Control/stop worrying 0 0 0 0  Worry too much - different things 0 0 0 0  Trouble relaxing 0 0 0 0  Restless 0 0 0 0  Easily annoyed or irritable 0 0 0 0  Afraid - awful might happen 0 0 0 0  Total GAD 7 Score 0 0 0 0  Anxiety Difficulty   Not difficult at all         01/14/2022   11:31 AM 01/09/2022    8:45 AM 05/29/2021    7:59 AM 08/29/2020   10:01 AM 07/19/2019    3:03 PM  Fall Risk   Falls in the past year? 0 0 0 0 0  Number falls in past yr:  0 0  0   Injury with Fall?  0 0 0   Risk for fall due to : No Fall Risks No Fall Risks     Follow up Falls evaluation completed Falls evaluation completed Falls evaluation completed Falls evaluation completed Falls evaluation completed   Immunization History  Administered Date(s) Administered   Influenza Split 10/21/2017   Influenza,inj,Quad PF,6+ Mos 12/20/2018, 08/26/2019   Influenza-Unspecified 09/28/2021   PFIZER(Purple Top)SARS-COV-2 Vaccination 01/23/2020, 02/18/2020, 11/01/2020   PNEUMOCOCCAL CONJUGATE-20 05/29/2021   Tdap 03/22/2018   Zoster Recombinat (Shingrix) 07/26/2019, 03/07/2020   Past Medical History:  Diagnosis Date   Arthritis    hands and feet   Cutaneous skin tags 01/09/2022   Dysrhythmia    a-fib   Elevated LFTs    Hearing aid worn 2021   right   Hyperlipidemia    Persistent atrial fibrillation (HCC)    RLS (restless legs syndrome)    Sleep apnea    does not use CPAP   Allergies  Allergen Reactions   Sulfa Antibiotics     Unknown reaction in childhood   Past Surgical History:  Procedure Laterality Date   ATRIAL FIBRILLATION ABLATION  2015   BONE EXCISION Right 05/09/2021   Procedure: RIGHT TIBIA EXCISION;   Surgeon: Hiram Gash, MD;  Location: [facility];  Service: Orthopedics;  Laterality: Right;   COLONOSCOPY     EXCISION MORTON'S NEUROMA Right 05/09/2021   Procedure: EXCISION OF RIGHT SECOND TOE MASS;  Surgeon: Erle Crocker, MD;  Location: [facility];  Service: Orthopedics;  Laterality: Right;   FOOT SURGERY Left    HERNIA REPAIR     LUMBAR [place] SURGERY  2012   L3-L4, No hardware   Family History  Problem Relation Age of Onset   Diabetes Mother    Diabetes Brother    Early death Brother    Heart disease Brother    Colon cancer Neg Hx    Esophageal cancer Neg Hx    Rectal cancer Neg Hx    Stomach cancer Neg Hx    Liver disease Neg Hx    Pancreatic cancer Neg Hx    Social History   Social History Narrative   Married to [place]. 2 children [place].    BS degree.    Wear seatbelt. Wears a bicycle helmet. Smoke detector in the home.   Firearms in the home.   Feels safe in  Relationships.   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 If you have any questions, ask your nurse or doctor.          atorvastatin 40 MG tablet Commonly known as: LIPITOR Take 1 tablet (40 mg total) by mouth daily.   dofetilide 500 MCG capsule Commonly known as: TIKOSYN TAKE 1 CAPSULE BY MOUTH 2 TIMES DAILY.   escitalopram 5 MG tablet Commonly known as: LEXAPRO Take 1 tablet (5 mg total) by mouth daily.   tamsulosin 0.4 MG Caps capsule Commonly known as: FLOMAX Take 1 capsule (0.4 mg total) by mouth daily. Started by: Howard Pouch, DO       All  past medical history, surgical history, allergies, family history, immunizations andmedications were updated in the EMR today and reviewed  under the history and medication portions of their EMR.     No results found.  ROS: 14 pt review of systems performed and negative (unless mentioned in an HPI)  Objective: BP 124/68   Pulse 63   Temp 98 F (36.7 C) (Oral)   Ht '6\' 1"'$  (1.854 m)   Wt 228 lb (103.4 kg)   SpO2 97%   BMI 30.08 kg/m  Physical Exam Vitals and nursing note reviewed.  Constitutional:      General: He is not in acute distress.    Appearance: Normal appearance. He is not ill-appearing, toxic-appearing or diaphoretic.  HENT:     Head: Normocephalic and atraumatic.  Eyes:     General: No scleral icterus.       Right eye: No discharge.        Left eye: No discharge.     Extraocular Movements: Extraocular movements intact.     Pupils: Pupils are equal, round, and reactive to light.  Cardiovascular:     Rate and Rhythm: Normal rate and regular rhythm.     Heart sounds: No murmur heard. Pulmonary:     Effort: Pulmonary effort is normal.     Breath sounds: Normal breath sounds.  Musculoskeletal:     Right lower leg: No edema.     Left lower leg: No edema.  Lymphadenopathy:     Cervical: No cervical adenopathy.  Skin:    General: Skin is warm and dry.     Coloration: Skin is not jaundiced or pale.     Findings: No rash.  Neurological:     Mental Status: He is alert and oriented to person, place, and time. Mental status is at baseline.  Psychiatric:        Mood and Affect: Mood normal.        Behavior: Behavior normal.        Thought Content: Thought content normal.        Judgment: Judgment normal.     No results found.  Assessment/plan: Johnathan [name] is a 66 y.o. male present for [facility] Permanent atrial fibrillation ([place]) Stable meds managed by cardio Encouraged baby ASA restart  Anxiety Stable. Continue Lexapro 5 mg daily   Mixed hyperlipidemia Stable Continue atorvastatin    BPH associated with nocturia Nocturia becoming more persistent. He is monitoring fluids after 7 PM. He  would like to try Flomax.   Start Flomax 0.4 mg daily.  He understands it takes about 4 weeks to see with this dose will do and if need be we can increase at that time. We again discussed the possible sulfa allergy. This was called in for him with caution on a reported possible sulfa allergy.  He stated before and it is in the record, they his symptoms were mild and he is not certain what they were because he was a child.  He has not had any issues in life since. Educated him people with true sulfa allergies rarely have a reaction to Flomax, but it can occur if he had a severe reaction to sulfa in the past. He was encouraged that if he does decide to start Flomax to keep Benadryl 50 mg on him in the event he would have a reaction-of course seek immediate attention if any swelling of the face, lips or difficulty breathing.   Return in about 24 weeks (around 01/16/2023) for cpe (20  min), Routine chronic condition follow-up.  No orders of the defined types were placed in this encounter.  Meds ordered this encounter  Medications   escitalopram (LEXAPRO) 5 MG tablet    Sig: Take 1 tablet (5 mg total) by mouth daily.    Dispense:  90 tablet    Refill:  1   tamsulosin (FLOMAX) 0.4 MG CAPS capsule    Sig: Take 1 capsule (0.4 mg total) by mouth daily.    Dispense:  90 capsule    Refill:  1   atorvastatin (LIPITOR) 40 MG tablet    Sig: Take 1 tablet (40 mg total) by mouth daily.    Dispense:  90 tablet    Refill:  3   Referral Orders  No referral(s) requested today     Note is dictated utilizing voice recognition software. Although note has been proof read prior to signing, occasional typographical errors still can be missed. If any questions arise, please do not hesitate to call for verification.  Electronically signed by: Howard Pouch, DO [place]

## 2022-08-01 NOTE — Patient Instructions (Addendum)
Return in about 24 weeks (around 01/16/2023) for cpe (20 min), Routine chronic condition follow-up.        Great to see you today.  I have refilled the medication(s) we provide.   If labs were collected, we will inform you of lab results once received either by echart message or telephone call.   - echart message- for normal results that have been seen by the patient already.   - telephone call: abnormal results or if patient has not viewed results in their echart.

## 2022-09-02 ENCOUNTER — Telehealth: Payer: Self-pay | Admitting: Cardiovascular Disease

## 2022-09-02 ENCOUNTER — Telehealth: Payer: Self-pay

## 2022-09-02 NOTE — Telephone Encounter (Signed)
Called patient, advised that pharmacy team did not find a reason either.  Patient verbalized understanding.  Thankful for call back.

## 2022-09-02 NOTE — Telephone Encounter (Addendum)
Patient called cardiology asking for abx and message was in review so he called our office. Provider is out of the office until tomorrow. LM for pt to return call.  Caprice Beaver, LPN to [facility] Pharm[facility]   09/02/22  4:09 PM Note Called patient, advised that he is going to be given Amoxicillin before his dental treatment and wanted to see if this was okay to take with his Tikosyn, when I asked why he would need antibiotics he states his previous cardiologist told him he had a valve issue and needed to have it and his primary care provider would send it in- I reviewed last notes and did not see any mention of this, only last ECHO 3 years ago states regurgitation noted on this exam. I asked if he had his valve replaced and he states no. I was not aware of any other indication for the need for antibiotics. He has not been to the dentist in 7-8 years, not sure if the information for this had changed but I advised I would check with pharmacy team to review

## 2022-09-02 NOTE — Telephone Encounter (Signed)
Called patient, advised that he is going to be given Amoxicillin before his dental treatment and wanted to see if this was okay to take with his Tikosyn, when I asked why he would need antibiotics he states his previous cardiologist told him he had a valve issue and needed to have it and his primary care provider would send it in- I reviewed last notes and did not see any mention of this, only last ECHO 3 years ago states regurgitation noted on this exam. I asked if he had his valve replaced and he states no. I was not aware of any other indication for the need for antibiotics. He has not been to the dentist in 7-8 years, not sure if the information for this had changed but I advised I would check with pharmacy team to review.   Thanks!

## 2022-09-02 NOTE — Telephone Encounter (Signed)
Patient must have antibiotic prescribed prior to any dental appts.  He is scheduled for dental appt on Thursday 9/7.  Please call antibiotic to [facility].  Please follow up with patient when sent to pharmacy. [phone]

## 2022-09-02 NOTE — Telephone Encounter (Signed)
I do not see any indication in his chart for SBE prophylaxis.

## 2022-09-02 NOTE — Telephone Encounter (Signed)
Pt c/o medication issue:  1. Name of Medication: dofetilide (TIKOSYN) 500 MCG capsule  2. How are you currently taking this medication (dosage and times per day)?   3. Are you having a reaction (difficulty breathing--STAT)?   4. What is your medication issue? Pt states that he has upcoming dentist appt where he will need antibiotic and he was wanting to know which antibiotics they can give him with the medication he is currently on. Requesting call back.

## 2022-09-03 NOTE — Telephone Encounter (Signed)
Do not see anything in med hx. Please advise if this is possible for [place] to do.

## 2022-09-03 NOTE — Telephone Encounter (Signed)
This decision is between his cardiovascular team and his dental team.

## 2022-09-04 NOTE — Telephone Encounter (Signed)
Pt state that an abx is not needed per dental team

## 2022-10-13 ENCOUNTER — Telehealth: Payer: Self-pay | Admitting: Family Medicine

## 2022-10-13 NOTE — Telephone Encounter (Signed)
Pt is asking for a prescription for Allopurinol, however he was last seen in Aug. I advised him to schedule and appointment since he was not seen for this recently and may be required to have an appt prior to starting  any medication. Please call patient and let him know if this request can be honored or not.  Please call patient at [phone]

## 2022-10-14 MED ORDER — ALLOPURINOL 100 MG PO TABS: 100.0000 mg | ORAL_TABLET | Freq: Every day | ORAL | 6 refills | Status: DC

## 2022-10-14 NOTE — Telephone Encounter (Addendum)
He can take over-the-counter NSAIDs for his flare (Aleve twice daily x5 days, or he can make an appointment if he would like medical evaluation and treatment with prescribed medication

## 2022-10-14 NOTE — Telephone Encounter (Signed)
Please call patient and clarify his desire to have allopurinol prescribed.  Allopurinol is not used for a current gout flare, it is used to prevent gout flares and needs to be monitored to ensure patient gets to therapeutic dose. He had not followed up in the past to have uric acid levels rechecked, and he stated he was not taking the medication last time.  I want to be sure he understands this is not for a current gout flare, it would make the flare worse.  I called in if he reports understanding and wants to restart.  We would need to recheck to make sure the levels of uric acid are normal at his next upcoming appointment.

## 2022-10-14 NOTE — Telephone Encounter (Signed)
Spoke with patient regarding results/recommendations.   Pt states that he has some swelling in his feet that he thinks is the start of a flare up. Advise pt that if he believes that he is having a flare up, he should make an appt. Pt states he has had them so many time he is almost sure that is what is going on and wanted to know if something can be called in for possible flare up.

## 2022-10-15 NOTE — Telephone Encounter (Signed)
Spoke with patient regarding results/recommendations.  

## 2022-10-17 ENCOUNTER — Telehealth: Payer: Self-pay | Admitting: Cardiovascular Disease

## 2022-10-17 NOTE — Telephone Encounter (Signed)
Pt c/o medication issue:  1. Name of Medication:  dofetilide (TIKOSYN) 500 MCG capsule colchicine  Indomethacin  allopurinol  2. How are you currently taking this medication (dosage and times per day)? Takes tikosyn 1 tablet twice a day, not currently taking the other medications  3. Are you having a reaction (difficulty breathing--STAT)? no  4. What is your medication issue? Patient wants to know if the medications will conflict with the tikosyn.

## 2022-10-17 NOTE — Telephone Encounter (Signed)
Returned the call to the patient. He wants to know if any of these medications will interfere with the Tikosyn.   Allopurinol 100 mg once daily. This will be a regular medication  These will be temporary: Colchicine 0.6 mg bid Indomethacin 50 mg tid

## 2022-10-20 NOTE — Telephone Encounter (Signed)
Received call from patient, advised of message below.  Patient verbalized understanding.

## 2022-10-20 NOTE — Telephone Encounter (Signed)
Pt is returning call. Transferred to Caprice Beaver, LPN.

## 2022-10-20 NOTE — Telephone Encounter (Signed)
Ok to take. No interactions with Tikosyn.

## 2022-10-20 NOTE — Telephone Encounter (Signed)
Called patient, LVM, advised of message below.  Left call back number.

## 2022-10-28 ENCOUNTER — Encounter: Payer: Self-pay | Admitting: Family Medicine

## 2022-10-28 ENCOUNTER — Ambulatory Visit: Payer: 59 | Admitting: Family Medicine

## 2022-10-28 ENCOUNTER — Telehealth: Payer: Self-pay

## 2022-10-28 VITALS — BP 139/83 | HR 56 | Temp 98.3°F | Wt 234.4 lb

## 2022-10-28 DIAGNOSIS — M109 Gout, unspecified: Secondary | ICD-10-CM

## 2022-10-28 MED ORDER — METHYLPREDNISOLONE ACETATE 80 MG/ML IJ SUSP
80.0000 mg | Freq: Once | INTRAMUSCULAR | Status: AC
  Administered 2022-10-28: 80 mg via INTRAMUSCULAR

## 2022-10-28 MED ORDER — PREDNISONE 20 MG PO TABS: ORAL_TABLET | ORAL | 0 refills | Status: DC

## 2022-10-28 NOTE — Telephone Encounter (Signed)
Pt scheduled for appt today

## 2022-10-28 NOTE — Progress Notes (Signed)
Johnathan [name] , Jul 12, 1956, 66 y.o., male MRN: [id number] Patient Care Team    Relationship Specialty Notifications Start End  Ma Hillock, DO PCP - General Family Medicine  04/15/17   Lorretta Harp, MD PCP - Cardiology Cardiology  12/21/19   Con Memos PCP - Pulmonology Family Medicine  12/21/19   Lorretta Harp, MD Consulting Physician Cardiology  03/22/18   Irene Shipper, MD Consulting Physician Gastroenterology  07/19/19   Dene Gentry, MD Consulting Physician Sports Medicine  03/07/20   Hiram Gash, MD Consulting Physician Orthopedic Surgery  03/07/20   Troy Sine, MD Consulting Physician Cardiology  04/02/20     Chief Complaint  Patient presents with   gout flare    Telehealth on 10/19; started taking allopurinol 2 days ago  COLCHICINE 0.'6MG'$  TABLETS  INDOMETHACIN  50 MG CAPS 1-2 left     Subjective: Pt presents for an OV with complaints of ongoing gout flare of his left ankle.  He reports it was swollen at one time and mildly red.  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  He has had gout in the past and had been on allopurinol in the past.  He had a telehealth visit which started him on colchicine and indomethacin.  He states there is still some discomfort in the left ankle joint.  It is no longer swollen.  It is still tender.     08/01/2022    4:05 PM 01/09/2022    8:45 AM 05/29/2021    7:59 AM 08/29/2020   10:01 AM 07/19/2019    3:03 PM  Depression screen PHQ 2/9  Decreased Interest 0 0 0 0 0  Down, Depressed, Hopeless 0 0 0 0 0  PHQ - 2 Score 0 0 0 0 0  Altered sleeping 1 1   0  Tired, decreased energy 0 0   0  Change in appetite 0 0   0  Feeling bad or failure about yourself  0 0   0  Trouble concentrating 0 0   0  Moving slowly or fidgety/restless 0 0   0  Suicidal thoughts 0 0   0  PHQ-9 Score 1 1   0    Allergies  Allergen Reactions   Sulfa Antibiotics     Unknown reaction in childhood   Social History   Social  History Narrative   Married to Payette. 2 children [place].    BS degree.    Wear seatbelt. Wears a bicycle helmet. Smoke detector in the home.   Firearms in the home.   Feels safe in  Relationships.   Lives in [place] and works as a [employer] on [employer] in [place] professional [employer] previously   Past Medical History:  Diagnosis Date   Arthritis    hands and feet   Cutaneous skin tags 01/09/2022   Dysrhythmia    a-fib   Elevated LFTs    Hearing aid worn 2021   right   Hyperlipidemia    Persistent atrial fibrillation (HCC)    RLS (restless legs syndrome)    Sleep apnea    does not use CPAP   Past Surgical History:  Procedure Laterality Date   ATRIAL FIBRILLATION ABLATION  2015   BONE EXCISION Right 05/09/2021   Procedure: RIGHT TIBIA EXCISION;  Surgeon: Hiram Gash, MD;  Location: [facility];  Service: Orthopedics;  Laterality: Right;   COLONOSCOPY     EXCISION MORTON'S NEUROMA Right 05/09/2021   Procedure: EXCISION OF RIGHT SECOND TOE MASS;  Surgeon: Erle Crocker, MD;  Location: [facility];  Service: Orthopedics;  Laterality: Right;   FOOT SURGERY Left    HERNIA REPAIR     LUMBAR [place] SURGERY  2012   L3-L4, No hardware   Family History  Problem Relation Age of Onset   Diabetes Mother    Diabetes Brother    Early death Brother    Heart disease Brother    Colon cancer Neg Hx    Esophageal cancer Neg Hx    Rectal cancer Neg Hx    Stomach cancer Neg Hx    Liver disease Neg Hx    Pancreatic cancer Neg Hx    Allergies as of 10/28/2022       Reactions   Sulfa Antibiotics    Unknown reaction in childhood        Medication List        Accurate as of October 28, 2022  4:29 PM. If you have any questions, ask your nurse or doctor.          allopurinol 100 MG tablet Commonly known as: ZYLOPRIM Take 1 tablet (100 mg total) by mouth daily.   atorvastatin 40 MG tablet Commonly known  as: LIPITOR Take 1 tablet (40 mg total) by mouth daily.   dofetilide 500 MCG capsule Commonly known as: TIKOSYN TAKE 1 CAPSULE BY MOUTH 2 TIMES DAILY.   escitalopram 5 MG tablet Commonly known as: LEXAPRO Take 1 tablet (5 mg total) by mouth daily.   predniSONE 20 MG tablet Commonly known as: DELTASONE 40 mg x3d, 20 mg x2d, 10 mg x2d Started by: Howard Pouch, DO   tamsulosin 0.4 MG Caps capsule Commonly known as: FLOMAX Take 1 capsule (0.4 mg total) by mouth daily.        All past medical history, surgical history, allergies, family history, immunizations andmedications were updated in the EMR today and reviewed under the history and medication portions of their EMR.     ROS Negative, with the exception of above mentioned in HPI   Objective:  BP 139/83   Pulse (!) 56   Temp 98.3 F (36.8 C)   Wt 234 lb 6.4 oz (106.3 kg)   SpO2 98%   BMI 30.93 kg/m  Body mass index is 30.93 kg/m. Physical Exam Vitals and nursing note reviewed.  Constitutional:      General: He is not in acute distress.    Appearance: Normal appearance. He is not ill-appearing, toxic-appearing or diaphoretic.  HENT:     Head: Normocephalic and atraumatic.  Eyes:     General: No scleral icterus.       Right eye: No discharge.        Left eye: No discharge.     Extraocular Movements: Extraocular movements intact.     Pupils: Pupils are equal, round, and reactive to light.  Musculoskeletal:     Right lower leg: No edema.     Comments: Left ankle with very mild swelling and mild tenderness to palpation proximal foot.  No erythema present.  Skin:    General: Skin is warm and dry.     Coloration: Skin is not jaundiced or pale.     Findings: No rash.  Neurological:     Mental Status: He is alert and oriented to person, place, and time.  Mental status is at baseline.  Psychiatric:        Mood and Affect: Mood normal.        Behavior: Behavior normal.        Thought Content: Thought content  normal.        Judgment: Judgment normal.      No results found. No results found. No results found for this or any previous visit (from the past 24 hour(s)).  Assessment/Plan: Johnathan [name] is a 66 y.o. male present for OV for  Gout  Possibly gout flare.  We discussed differential diagnosis of arthritic flare secondary to the increased walking on the cement floor.  With his history of gout we elected to treat as gout with prednisone to start tomorrow and IM Depo-Medrol today. Halfway through his prednisone taper he will restart the allopurinol 100 mg daily.  Follow-up in 4 weeks for lab appointment only to test uric acid levels. If level is above 6 will increase allopurinol dose and recheck again in 4 additional weeks. - Uric acid; Future  Reviewed expectations re: course of current medical issues. Discussed self-management of symptoms. Outlined signs and symptoms indicating need for more acute intervention. Patient verbalized understanding and all questions were answered. Patient received an After-Visit Summary.    Orders Placed This Encounter  Procedures   Uric acid   Meds ordered this encounter  Medications   predniSONE (DELTASONE) 20 MG tablet    Sig: 40 mg x3d, 20 mg x2d, 10 mg x2d    Dispense:  12 tablet    Refill:  0   Referral Orders  No referral(s) requested today     Note is dictated utilizing voice recognition software. Although note has been proof read prior to signing, occasional typographical errors still can be missed. If any questions arise, please do not hesitate to call for verification.   electronically signed by:  Howard Pouch, DO  [place]

## 2022-10-28 NOTE — Patient Instructions (Signed)
Gout  Gout is painful swelling of your joints. Gout is a type of arthritis. It is caused by having too much uric acid in your body. Uric acid is a chemical that is made when your body breaks down substances called purines. If your body has too much uric acid, sharp crystals can form and build up in your joints. This causes pain and swelling. Gout attacks can happen quickly and be very painful (acute gout). Over time, the attacks can affect more joints and happen more often (chronic gout). What are the causes? Gout is caused by too much uric acid in your blood. This can happen because: Your kidneys do not remove enough uric acid from your blood. Your body makes too much uric acid. You eat too many foods that are high in purines. These foods include organ meats, some seafood, and beer. Trauma or stress can bring on an attack. What increases the risk? Having a family history of gout. Being male and middle-aged. Being male and having gone through menopause. Having an organ transplant. Taking certain medicines. Having certain conditions, such as: Being very overweight (obese). Lead poisoning. Kidney disease. A skin condition called psoriasis. Other risks include: Losing weight too quickly. Not having enough water in the body (being dehydrated). Drinking alcohol, especially beer. Drinking beverages that are sweetened with a type of sugar called fructose. What are the signs or symptoms? An attack of acute gout often starts at night and usually happens in just one joint. The most common place is the big toe. Other joints that may be affected include joints of the feet, ankle, knee, fingers, wrist, or elbow. Symptoms may include: Very bad pain. Warmth. Swelling. Stiffness. Tenderness. The affected joint may be very painful to touch. Shiny, red, or purple skin. Chills and fever. Chronic gout may cause symptoms more often. More joints may be involved. You may also have white or yellow lumps  (tophi) on your hands or feet or in other areas near your joints. How is this treated? Treatment for an acute attack may include medicines for pain and swelling, such as: NSAIDs, such as ibuprofen. Steroids taken by mouth or injected into a joint. Colchicine. This can be given by mouth or through an IV tube. Treatment to prevent future attacks may include: Taking small doses of NSAIDs or colchicine daily. Using a medicine that reduces uric acid levels in your blood, such as allopurinol. Making changes to your diet. You may need to see a food expert (dietitian) about what to eat and drink to prevent gout. Follow these instructions at home: During a gout attack  If told, put ice on the painful area. To do this: Put ice in a plastic bag. Place a towel between your skin and the bag. Leave the ice on for 20 minutes, 2-3 times a day. Take off the ice if your skin turns bright red. This is very important. If you cannot feel pain, heat, or cold, you have a greater risk of damage to the area. Raise the painful joint above the level of your heart as often as you can. Rest the joint as much as possible. If the joint is in your leg, you may be given crutches. Follow instructions from your doctor about what you cannot eat or drink. Avoiding future gout attacks Eat a low-purine diet. Avoid foods and drinks such as: Liver. Kidney. Anchovies. Asparagus. Herring. Mushrooms. Mussels. Beer. Stay at a healthy weight. If you want to lose weight, talk with your doctor. Do not   lose weight too fast. Start or continue an exercise plan as told by your doctor. Eating and drinking Avoid drinks sweetened by fructose. Drink enough fluids to keep your pee (urine) pale yellow. If you drink alcohol: Limit how much you have to: 0-1 drink a day for women who are not pregnant. 0-2 drinks a day for men. Know how much alcohol is in a drink. In the [place] one drink equals one 12 oz bottle of beer (355 mL), one 5 oz  glass of wine (148 mL), or one 1 oz glass of hard liquor (44 mL). General instructions Take over-the-counter and prescription medicines only as told by your doctor. Ask your doctor if you should avoid driving or using machines while you are taking your medicine. Return to your normal activities when your doctor says that it is safe. Keep all follow-up visits. Where to find more information National Institutes of Health: www.niams.nih.gov Contact a doctor if: You have another gout attack. You still have symptoms of a gout attack after 10 days of treatment. You have problems (side effects) because of your medicines. You have chills or a fever. You have burning pain when you pee (urinate). You have pain in your lower back or belly. Get help right away if: You have very bad pain. Your pain cannot be controlled. You cannot pee. Summary Gout is painful swelling of the joints. The most common site of pain is the big toe, but it can affect other joints. Medicines and avoiding some foods can help to prevent and treat gout attacks. This information is not intended to replace advice given to you by your health care provider. Make sure you discuss any questions you have with your health care provider. Document Revised: 09/18/2021 Document Reviewed: 09/18/2021 Elsevier Patient Education  2023 Elsevier Inc.  

## 2022-10-28 NOTE — Telephone Encounter (Signed)
Patient is wanting to know if he can take #2 of the Allopurinol for gout flare up.  He states that meds are not helping with gout flare up, if anything he states it is worse now than it was 2 weeks ago.  Please advise [phone]

## 2022-10-28 NOTE — Addendum Note (Signed)
Addended by: Beatrix Fetters on: 10/28/2022 04:58 PM   Modules accepted: Orders

## 2022-11-15 ENCOUNTER — Telehealth: Payer: Self-pay | Admitting: Student in an Organized Health Care Education/Training Program

## 2022-11-15 DIAGNOSIS — I480 Paroxysmal atrial fibrillation: Secondary | ICD-10-CM

## 2022-11-15 MED ORDER — METOPROLOL TARTRATE 25 MG PO TABS: 25.0000 mg | ORAL_TABLET | Freq: Two times a day (BID) | ORAL | 2 refills | Status: DC | PRN

## 2022-11-15 MED ORDER — RIVAROXABAN 20 MG PO TABS: 20.0000 mg | ORAL_TABLET | Freq: Every day | ORAL | 2 refills | Status: DC

## 2022-11-15 NOTE — Telephone Encounter (Signed)
Paged by operator regarding questions on AF.   Patient has good rhythm awareness usually. Noticed his heart was "fluttering" around an hour ago. Currently ~105 bpm by manual pulse, no monitors at home. He is on dofetilide 500 mcg bid. He hasn't been out of rhythm since 6-8 months ago. Has had 3-5 DCCV in the past, last time he required DCCV was 3-4 years ago. He took metoprolol tartrate 25 mg once (took at [facility]). This was filled 08/28/21 (fill 10/09/20). Not on [facility] routinely. Took xarelto today at 1820 (20 mg x 1), the year fill date on this is worn off.   HR has now slowed back down now. Feels that it may be back to normal rates.   I discussed different options for management of paroxysmal atrial fibrillation with Mr. Genia Hotter.  He would prefer to have both metoprolol and Xarelto renewed.  Prescription sent to local pharmacy.  I asked him to monitor his baseline resting heart rate which usually is in the 60s.  If greater than 100 and he can take metoprolol.  He already took his Xarelto today although it is likely expired.  CHA2DS2-VASc of 1 however if persistent AF tomorrow I would start on Xarelto with plan for cardioversion.  He will call the clinic Monday and arrange for either EKG alone if he thinks he is back in normal rhythm versus clinic appointment if he thinks he is still in atrial fibrillation for AF management.

## 2022-11-28 ENCOUNTER — Other Ambulatory Visit: Payer: 59

## 2022-11-28 DIAGNOSIS — M109 Gout, unspecified: Secondary | ICD-10-CM

## 2022-11-29 LAB — URIC ACID: Uric Acid, Serum: 5.3 mg/dL (ref 4.0–8.0)

## 2022-12-21 ENCOUNTER — Other Ambulatory Visit: Payer: Self-pay | Admitting: Family Medicine

## 2022-12-21 DIAGNOSIS — F419 Anxiety disorder, unspecified: Secondary | ICD-10-CM

## 2023-01-05 ENCOUNTER — Telehealth: Payer: Self-pay | Admitting: Family Medicine

## 2023-01-05 NOTE — Telephone Encounter (Signed)
Pt calls with pain from Gout and is asking if the medications that were prescribed to him last time could be called in without an appointment. He states its his left big toe and the symptoms are identical to last time. He is scheduled for a physical on 01/19/23. Please call patient at earliest convenience.

## 2023-01-06 NOTE — Telephone Encounter (Signed)
LVM for pt to CB regarding rx.   Will like to confirm which medication pt is referring to. Pt will most likely need appt.

## 2023-01-07 NOTE — Telephone Encounter (Signed)
Spoke with pt and informed him that he will need to have an appt for the script PRED. Pt states sx are improving

## 2023-01-16 ENCOUNTER — Other Ambulatory Visit: Payer: Self-pay | Admitting: Family Medicine

## 2023-01-16 DIAGNOSIS — F419 Anxiety disorder, unspecified: Secondary | ICD-10-CM

## 2023-01-19 ENCOUNTER — Encounter: Payer: Self-pay | Admitting: Family Medicine

## 2023-01-19 ENCOUNTER — Ambulatory Visit (INDEPENDENT_AMBULATORY_CARE_PROVIDER_SITE_OTHER): Payer: 59 | Admitting: Family Medicine

## 2023-01-19 VITALS — BP 122/76 | HR 76 | Temp 98.2°F | Ht 74.0 in | Wt 235.8 lb

## 2023-01-19 DIAGNOSIS — Z0000 Encounter for general adult medical examination without abnormal findings: Secondary | ICD-10-CM | POA: Diagnosis not present

## 2023-01-19 DIAGNOSIS — Z1211 Encounter for screening for malignant neoplasm of colon: Secondary | ICD-10-CM | POA: Diagnosis not present

## 2023-01-19 DIAGNOSIS — Z23 Encounter for immunization: Secondary | ICD-10-CM

## 2023-01-19 DIAGNOSIS — F419 Anxiety disorder, unspecified: Secondary | ICD-10-CM

## 2023-01-19 DIAGNOSIS — R351 Nocturia: Secondary | ICD-10-CM

## 2023-01-19 DIAGNOSIS — E7800 Pure hypercholesterolemia, unspecified: Secondary | ICD-10-CM

## 2023-01-19 DIAGNOSIS — Z125 Encounter for screening for malignant neoplasm of prostate: Secondary | ICD-10-CM

## 2023-01-19 DIAGNOSIS — Z79899 Other long term (current) drug therapy: Secondary | ICD-10-CM

## 2023-01-19 DIAGNOSIS — N401 Enlarged prostate with lower urinary tract symptoms: Secondary | ICD-10-CM

## 2023-01-19 DIAGNOSIS — I4821 Permanent atrial fibrillation: Secondary | ICD-10-CM | POA: Diagnosis not present

## 2023-01-19 DIAGNOSIS — M109 Gout, unspecified: Secondary | ICD-10-CM

## 2023-01-19 DIAGNOSIS — I484 Atypical atrial flutter: Secondary | ICD-10-CM

## 2023-01-19 LAB — COMPREHENSIVE METABOLIC PANEL
ALT: 18 U/L (ref 0–53)
AST: 18 U/L (ref 0–37)
Albumin: 4.2 g/dL (ref 3.5–5.2)
Alkaline Phosphatase: 88 U/L (ref 39–117)
BUN: 16 mg/dL (ref 6–23)
CO2: 30 mEq/L (ref 19–32)
Calcium: 9.6 mg/dL (ref 8.4–10.5)
Chloride: 101 mEq/L (ref 96–112)
Creatinine, Ser: 0.85 mg/dL (ref 0.40–1.50)
GFR: 90.31 mL/min (ref >60.00)
Glucose, Bld: 103 mg/dL — ABNORMAL HIGH (ref 70–99)
Potassium: 5 mEq/L (ref 3.5–5.1)
Sodium: 139 mEq/L (ref 135–145)
Total Bilirubin: 1.3 mg/dL — ABNORMAL HIGH (ref 0.2–1.2)
Total Protein: 6.6 g/dL (ref 6.0–8.3)

## 2023-01-19 LAB — CBC WITH DIFFERENTIAL/PLATELET
Basophils Absolute: 0 10*3/uL (ref 0.0–0.1)
Basophils Relative: 0.7 % (ref 0.0–3.0)
Eosinophils Absolute: 0.2 10*3/uL (ref 0.0–0.7)
Eosinophils Relative: 2.6 % (ref 0.0–5.0)
HCT: 45.8 % (ref 39.0–52.0)
Hemoglobin: 15.5 g/dL (ref 13.0–17.0)
Lymphocytes Relative: 19.8 % (ref 12.0–46.0)
Lymphs Abs: 1.4 10*3/uL (ref 0.7–4.0)
MCHC: 33.9 g/dL (ref 30.0–36.0)
MCV: 89.6 fl (ref 78.0–100.0)
Monocytes Absolute: 0.5 10*3/uL (ref 0.1–1.0)
Monocytes Relative: 7.3 % (ref 3.0–12.0)
Neutro Abs: 5 10*3/uL (ref 1.4–7.7)
Neutrophils Relative %: 69.6 % (ref 43.0–77.0)
Platelets: 276 10*3/uL (ref 150.0–400.0)
RBC: 5.11 Mil/uL (ref 4.22–5.81)
RDW: 14.6 % (ref 11.5–15.5)
WBC: 7.2 10*3/uL (ref 4.0–10.5)

## 2023-01-19 LAB — LIPID PANEL
Cholesterol: 176 mg/dL (ref 0–200)
HDL: 56.5 mg/dL (ref >39.00)
LDL Cholesterol: 104 mg/dL — ABNORMAL HIGH (ref 0–99)
NonHDL: 119.73
Total CHOL/HDL Ratio: 3
Triglycerides: 77 mg/dL (ref 0.0–149.0)
VLDL: 15.4 mg/dL (ref 0.0–40.0)

## 2023-01-19 LAB — PSA: PSA: 2.48 ng/mL (ref 0.10–4.00)

## 2023-01-19 LAB — HEMOGLOBIN A1C: Hgb A1c MFr Bld: 6.1 % (ref 4.6–6.5)

## 2023-01-19 LAB — TSH: TSH: 2.14 u[IU]/mL (ref 0.35–5.50)

## 2023-01-19 MED ORDER — ESCITALOPRAM OXALATE 5 MG PO TABS: 5.0000 mg | ORAL_TABLET | Freq: Every day | ORAL | 3 refills | Status: DC

## 2023-01-19 MED ORDER — ALLOPURINOL 100 MG PO TABS: 200.0000 mg | ORAL_TABLET | Freq: Every day | ORAL | 3 refills | Status: DC

## 2023-01-19 MED ORDER — ATORVASTATIN CALCIUM 40 MG PO TABS: 40.0000 mg | ORAL_TABLET | Freq: Every day | ORAL | 3 refills | Status: DC

## 2023-01-19 MED ORDER — TAMSULOSIN HCL 0.4 MG PO CAPS: 0.4000 mg | ORAL_CAPSULE | Freq: Every day | ORAL | 3 refills | Status: DC

## 2023-01-19 MED ORDER — COLCHICINE 0.6 MG PO TABS: ORAL_TABLET | ORAL | 11 refills | Status: DC

## 2023-01-19 NOTE — Progress Notes (Signed)
Patient ID: Johnathan [name], male  DOB: May 13, 1956, 67 y.o.   MRN: [id number] Patient Care Team    Relationship Specialty Notifications Start End  Ma Hillock, DO PCP - General Family Medicine  04/15/17   Lorretta Harp, MD PCP - Cardiology Cardiology  12/21/19   Con Memos PCP - Pulmonology Family Medicine  12/21/19   Lorretta Harp, MD Consulting Physician Cardiology  03/22/18   Irene Shipper, MD Consulting Physician Gastroenterology  07/19/19   Dene Gentry, MD Consulting Physician Sports Medicine  03/07/20   Hiram Gash, MD Consulting Physician Orthopedic Surgery  03/07/20   Troy Sine, MD Consulting Physician Cardiology  04/02/20     Chief Complaint  Patient presents with   Annual Exam    Pt is fasting    Subjective:  Johnathan [name] is a 67 y.o. male present for CPE and Chronic Conditions/illness Management combination appt All past medical history, surgical history, allergies, family history, immunizations, medications and social history were updated in the electronic medical record today. All recent labs, ED visits and hospitalizations within the last year were reviewed.  Health maintenance:  Colonoscopy: last screen 01/18/2019, recommend follow up 3 yr; DUE- Dr. Henrene Pastor- pt provided with contact information Immunizations:  tdap UTD3/2019, influenza completed today.  shingrix completed,  PNA 20 completed  Infectious disease screening: HIV and Hep C completed PSA: low risk- desires yearly PSA Lab Results  Component Value Date   PSA 1.35 05/29/2021   PSA 1.1 07/26/2019   PSA 1.51 03/22/2018  , pt was counseled on prostate cancer screenings.  Assistive device: none Oxygen JHE:RDEY Patient has a Dental home. Hospitalizations/ED visits: reviewed- recent surgery.    Anxiety: Pt feels anxiety is well  controlled on lexapro 5 mg qd. He may want to eventually try to take less, but he gets a headache when he stops use.   A.fib/HLD: Follows with cardio for  a.fib. compliant with statin.   Gout: Patient reports compliance with allopurinol 100 mg daily.  He has had a few episodes of flares.  He also noticed left elbow discomfort and swelling last week, that has started to resolve.     08/01/2022    4:05 PM 01/09/2022    8:45 AM 05/29/2021    7:59 AM 08/29/2020   10:01 AM 07/19/2019    3:03 PM  Depression screen PHQ 2/9  Decreased Interest 0 0 0 0 0  Down, Depressed, Hopeless 0 0 0 0 0  PHQ - 2 Score 0 0 0 0 0  Altered sleeping 1 1   0  Tired, decreased energy 0 0   0  Change in appetite 0 0   0  Feeling bad or failure about yourself  0 0   0  Trouble concentrating 0 0   0  Moving slowly or fidgety/restless 0 0   0  Suicidal thoughts 0 0   0  PHQ-9 Score 1 1   0      08/01/2022    4:05 PM 01/09/2022    8:53 AM 03/07/2020    3:24 PM 07/19/2019    3:04 PM  GAD 7 : Generalized Anxiety Score  Nervous, Anxious, on Edge 0 0 0 0  Control/stop worrying 0 0 0 0  Worry too much - different things 0 0 0 0  Trouble relaxing 0 0 0 0  Restless 0 0 0 0  Easily annoyed or irritable 0 0 0 0  Afraid -  awful might happen 0 0 0 0  Total GAD 7 Score 0 0 0 0  Anxiety Difficulty   Not difficult at all         01/14/2022   11:31 AM 01/09/2022    8:45 AM 05/29/2021    7:59 AM 08/29/2020   10:01 AM 07/19/2019    3:03 PM  [place] in the past year? 0 0 0 0 0  Number falls in past yr:  0 0 0   Injury with Fall?  0 0 0   Risk for fall due to : No Fall Risks No Fall Risks     Follow up Falls evaluation completed Falls evaluation completed Falls evaluation completed Falls evaluation completed Falls evaluation completed   Immunization History  Administered Date(s) Administered   Fluad Quad(high Dose 65+) 01/19/2023   Influenza Split 10/21/2017   Influenza,inj,Quad PF,6+ Mos 12/20/2018, 08/26/2019   Influenza-Unspecified 09/28/2021   PFIZER(Purple Top)SARS-COV-2 Vaccination 01/23/2020, 02/18/2020, 11/01/2020   PNEUMOCOCCAL CONJUGATE-20 05/29/2021    Tdap 03/22/2018   Zoster Recombinat (Shingrix) 07/26/2019, 03/07/2020   Past Medical History:  Diagnosis Date   Arthritis    hands and feet   Cutaneous skin tags 01/09/2022   Dysrhythmia    a-fib   Elevated LFTs    Hearing aid worn 2021   right   Hyperlipidemia    Persistent atrial fibrillation (HCC)    RLS (restless legs syndrome)    Sleep apnea    does not use CPAP   Allergies  Allergen Reactions   Sulfa Antibiotics     Unknown reaction in childhood   Past Surgical History:  Procedure Laterality Date   ATRIAL FIBRILLATION ABLATION  2015   BONE EXCISION Right 05/09/2021   Procedure: RIGHT TIBIA EXCISION;  Surgeon: Hiram Gash, MD;  Location: [facility];  Service: Orthopedics;  Laterality: Right;   COLONOSCOPY     EXCISION MORTON'S NEUROMA Right 05/09/2021   Procedure: EXCISION OF RIGHT SECOND TOE MASS;  Surgeon: Erle Crocker, MD;  Location: [facility];  Service: Orthopedics;  Laterality: Right;   FOOT SURGERY Left    HERNIA REPAIR     LUMBAR [place] SURGERY  2012   L3-L4, No hardware   Family History  Problem Relation Age of Onset   Diabetes Mother    Diabetes Brother    Early death Brother    Heart disease Brother    Colon cancer Neg Hx    Esophageal cancer Neg Hx    Rectal cancer Neg Hx    Stomach cancer Neg Hx    Liver disease Neg Hx    Pancreatic cancer Neg Hx    Social History   Social History Narrative   Married to [place]. 2 children [place].    BS degree.    Wear seatbelt. Wears a bicycle helmet. Smoke detector in the home.   Firearms in the home.   Feels safe in  Relationships.   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 If you have any  questions, ask your nurse or doctor.  STOP taking these medications    indomethacin 50 MG capsule Commonly known as: INDOCIN Stopped by: Howard Pouch, DO   predniSONE 20 MG tablet Commonly known as: DELTASONE Stopped by: Howard Pouch, DO   rivaroxaban 20 MG Tabs tablet Commonly known as: XARELTO Stopped by: Howard Pouch, DO       TAKE these medications    allopurinol 100 MG tablet Commonly known as: ZYLOPRIM Take 2 tablets (200 mg total) by mouth daily. What changed: how much to take Changed by: Howard Pouch, DO   atorvastatin 40 MG tablet Commonly known as: LIPITOR Take 1 tablet (40 mg total) by mouth daily.   colchicine 0.6 MG tablet 1 tab po with onset of gout, repeat 1 tab po in 3 hours.Then 1 tab po daily until resolved. What changed:  how to take this additional instructions Changed by: Howard Pouch, DO   dofetilide 500 MCG capsule Commonly known as: TIKOSYN TAKE 1 CAPSULE BY MOUTH 2 TIMES DAILY.   escitalopram 5 MG tablet Commonly known as: LEXAPRO Take 1 tablet (5 mg total) by mouth daily.   metoprolol tartrate 25 MG tablet Commonly known as: LOPRESSOR Take 1 tablet (25 mg total) by mouth 2 (two) times daily as needed (HR >110).   tamsulosin 0.4 MG Caps capsule Commonly known as: FLOMAX Take 1 capsule (0.4 mg total) by mouth daily.       All past medical history, surgical history, allergies, family history, immunizations andmedications were updated in the EMR today and reviewed under the history and medication portions of their EMR.     No results found.  ROS: 14 pt review of systems performed and negative (unless mentioned in an HPI)  Objective: BP 122/76   Pulse 76   Temp 98.2 F (36.8 C)   Ht '6\' 2"'$  (1.88 m)   Wt 235 lb 12.8 oz (107 kg)   SpO2 98%   BMI 30.27 kg/m  Physical Exam Constitutional:      General: He is not in acute distress.    Appearance: Normal appearance. He is not ill-appearing, toxic-appearing or  diaphoretic.  HENT:     Head: Normocephalic and atraumatic.     Right Ear: Tympanic membrane, ear canal and external ear normal. There is no impacted cerumen.     Left Ear: Tympanic membrane, ear canal and external ear normal. There is no impacted cerumen.     Nose: Nose normal. No congestion or rhinorrhea.     Mouth/Throat:     Mouth: Mucous membranes are moist.     Pharynx: Oropharynx is clear. No oropharyngeal exudate or posterior oropharyngeal erythema.  Eyes:     General: No scleral icterus.       Right eye: No discharge.        Left eye: No discharge.     Extraocular Movements: Extraocular movements intact.     Pupils: Pupils are equal, round, and reactive to light.  Cardiovascular:     Rate and Rhythm: Normal rate and regular rhythm.     Pulses: Normal pulses.     Heart sounds: Normal heart sounds. No murmur heard.    No friction rub. No gallop.  Pulmonary:     Effort: Pulmonary effort is normal. No respiratory distress.     Breath sounds: Normal breath sounds. No stridor. No wheezing, rhonchi or rales.  Chest:     Chest wall: No tenderness.  Abdominal:     General: Abdomen is flat. Bowel sounds are normal. There is no distension.  Palpations: Abdomen is soft. There is no mass.     Tenderness: There is no abdominal tenderness. There is no right CVA tenderness, left CVA tenderness, guarding or rebound.     Hernia: No hernia is present.  Musculoskeletal:        General: No swelling or tenderness. Normal range of motion.     Cervical back: Normal range of motion and neck supple.     Right lower leg: No edema.     Left lower leg: No edema.     Comments: Mild swelling left elbow.  Very small effusion remaining.  No drainage.  No tenderness.  Mild erythema surrounding elbow.  Lymphadenopathy:     Cervical: No cervical adenopathy.  Skin:    General: Skin is warm and dry.     Coloration: Skin is not jaundiced.     Findings: No bruising, lesion or rash.  Neurological:      General: No focal deficit present.     Mental Status: He is alert and oriented to person, place, and time. Mental status is at baseline.     Cranial Nerves: No cranial nerve deficit.     Sensory: No sensory deficit.     Motor: No weakness.     Coordination: Coordination normal.     Gait: Gait normal.     Deep Tendon Reflexes: Reflexes normal.  Psychiatric:        Mood and Affect: Mood normal.        Behavior: Behavior normal.        Thought Content: Thought content normal.        Judgment: Judgment normal.    No results found.  Assessment/plan: Johnathan [name] is a 67 y.o. male present for CPE and Chronic Conditions/illness Management Combination appt Permanent atrial fibrillation ([place]) stable meds managed by cardio (xarelto (no longer taking), tikosyn and lopressor) Encouraged baby ASA restart CBC, CMP, A1c and lipids collected today  Anxiety stable Discussed tapering down/off and provided instructions for him to do so if desired.  - continue lexapro 5 mg qd.  May follow yearly unless needed sooner since he has been stable on dose for many years.  TSH collected today Gout: Uric acid has been at goal, however he is reporting a few flares since he was last seen.  He admits he is not always adherent to gout diet. Goal UA <6 Increase allopurinol to 200 mg daily Would encourage him to start naproxen or ibuprofen scheduled over the next 2 to 3 days to ensure elbow issue resolves.  Uncertain if Related versus bursa. Colchicine for flares.  BPH: Stable Continue flomax 0.4 qd Long-term use of high-risk medication - CBC with Differential/Platelet - Comprehensive metabolic panel - Hemoglobin A1c   Prostate cancer screening - PSA Influenza vaccine needed - Flu Vaccine QUAD High Dose(Fluad) Colon cancer screening - Ambulatory referral to Gastroenterology  Routine general medical examination at a health care facility - CBC with Differential/Platelet - Comprehensive metabolic  panel - Hemoglobin A1c - Lipid panel - PSA - TSH Colonoscopy: last screen 01/18/2019, recommend follow up 3 yr; DUE- Dr. Henrene Pastor- pt provided with contact information Immunizations:  tdap UTD3/2019, influenza completed today.  shingrix completed,  PNA 20 completed  Infectious disease screening: HIV and Hep C completed PSA: low risk- desires yearly PSA Patient was encouraged to exercise greater than 150 minutes a week. Patient was encouraged to choose a diet filled with fresh fruits and vegetables, and lean meats. AVS provided to patient today for education/recommendation  on gender specific health and safety maintenance.     Return in about 1 year (around 01/21/2024) for cpe (20 min), Routine chronic condition follow-up.  Orders Placed This Encounter  Procedures   Flu Vaccine QUAD High Dose(Fluad)   CBC with Differential/Platelet   Comprehensive metabolic panel   Hemoglobin A1c   Lipid panel   PSA   TSH   Ambulatory referral to Gastroenterology   Meds ordered this encounter  Medications   allopurinol (ZYLOPRIM) 100 MG tablet    Sig: Take 2 tablets (200 mg total) by mouth daily.    Dispense:  180 tablet    Refill:  3   atorvastatin (LIPITOR) 40 MG tablet    Sig: Take 1 tablet (40 mg total) by mouth daily.    Dispense:  90 tablet    Refill:  3   escitalopram (LEXAPRO) 5 MG tablet    Sig: Take 1 tablet (5 mg total) by mouth daily.    Dispense:  90 tablet    Refill:  3   tamsulosin (FLOMAX) 0.4 MG CAPS capsule    Sig: Take 1 capsule (0.4 mg total) by mouth daily.    Dispense:  90 capsule    Refill:  3   colchicine 0.6 MG tablet    Sig: 1 tab po with onset of gout, repeat 1 tab po in 3 hours.Then 1 tab po daily until resolved.    Dispense:  30 tablet    Refill:  11   Referral Orders         Ambulatory referral to Gastroenterology       Note is dictated utilizing voice recognition software. Although note has been proof read prior to signing, occasional typographical  errors still can be missed. If any questions arise, please do not hesitate to call for verification.  Electronically signed by: Howard Pouch, DO [place]

## 2023-01-19 NOTE — Patient Instructions (Addendum)
Your colonoscopy is DUE- please call to schedule.  [name] Address:  [address], [place], [place] [address] Phone: [phone]  Return in about 1 year (around 01/21/2024) for cpe (20 min), Routine chronic condition follow-up.        Great to see you today.  I have refilled the medication(s) we provide.   If labs were collected, we will inform you of lab results once received either by echart message or telephone call.   - echart message- for normal results that have been seen by the patient already.   - telephone call: abnormal results or if patient has not viewed results in their echart.

## 2023-01-20 ENCOUNTER — Encounter: Payer: Self-pay | Admitting: Internal Medicine

## 2023-01-23 ENCOUNTER — Telehealth: Payer: Self-pay | Admitting: Family Medicine

## 2023-01-23 NOTE — Telephone Encounter (Signed)
Pt was seen Monday and mentioned his elbow.He was told by Dr. Raoul Pitch if it worsened to come back in and see her.  He states that its swollen and seems to have fluid.  I informed him she was book today and offered first available on Tuesday. He then states he wont make it until Tuesday, so I then tell him I have something Monday at 11 am. I told him I would have a CMA call him because they may be able to work something out.  Please call patient to let him know next available time.

## 2023-01-23 NOTE — Telephone Encounter (Signed)
Spoke with patient regarding results/recommendations. Pt scheduled for Monday

## 2023-01-26 ENCOUNTER — Ambulatory Visit: Payer: 59 | Admitting: Family Medicine

## 2023-01-26 ENCOUNTER — Telehealth: Payer: Self-pay | Admitting: Family Medicine

## 2023-01-26 ENCOUNTER — Encounter: Payer: Self-pay | Admitting: Family Medicine

## 2023-01-26 VITALS — BP 133/71 | HR 63 | Temp 97.8°F | Ht 74.0 in | Wt 234.0 lb

## 2023-01-26 DIAGNOSIS — M25422 Effusion, left elbow: Secondary | ICD-10-CM | POA: Diagnosis not present

## 2023-01-26 DIAGNOSIS — M7022 Olecranon bursitis, left elbow: Secondary | ICD-10-CM

## 2023-01-26 MED ORDER — DOXYCYCLINE HYCLATE 100 MG PO TABS: 100.0000 mg | ORAL_TABLET | Freq: Two times a day (BID) | ORAL | 0 refills | Status: DC

## 2023-01-26 NOTE — Telephone Encounter (Signed)
Pt forgot to ask during his appointment if it is ok for him to workout and go to the gym with his elbow concern? Please call patient with answer.

## 2023-01-26 NOTE — Telephone Encounter (Signed)
Pt informed

## 2023-01-26 NOTE — Patient Instructions (Signed)
No follow-ups on file.        Great to see you today.  I have refilled the medication(s) we provide.   If labs were collected, we will inform you of lab results once received either by echart message or telephone call.   - echart message- for normal results that have been seen by the patient already.   - telephone call: abnormal results or if patient has not viewed results in their echart.  

## 2023-01-26 NOTE — Telephone Encounter (Signed)
He can continue to work out, but would avoid upper body workout for about a week.

## 2023-01-26 NOTE — Telephone Encounter (Signed)
Please advise on limitations if any.

## 2023-01-26 NOTE — Progress Notes (Signed)
Johnathan [name] , 1956/05/17, 67 y.o., male MRN: [id number] Patient Care Team    Relationship Specialty Notifications Start End  Ma Hillock, DO PCP - General Family Medicine  04/15/17   Lorretta Harp, MD PCP - Cardiology Cardiology  12/21/19   Con Memos PCP - Pulmonology Family Medicine  12/21/19   Lorretta Harp, MD Consulting Physician Cardiology  03/22/18   Irene Shipper, MD Consulting Physician Gastroenterology  07/19/19   Dene Gentry, MD Consulting Physician Sports Medicine  03/07/20   Hiram Gash, MD Consulting Physician Orthopedic Surgery  03/07/20   Troy Sine, MD Consulting Physician Cardiology  04/02/20     Chief Complaint  Patient presents with   Cyst    Pt c/o cyst on L elbow x 1.5 weeks     Subjective: Pt presents for an OV with complaints of left elbow swelling of 1.5 weeks duration.  Associated symptoms include tenderness and redness. He denies fever, chills, nausea. He has a h/o gout. He has had bursitis in the past.  Pt has tried nsaids to ease their symptoms.      01/26/2023    8:51 AM 08/01/2022    4:05 PM 01/09/2022    8:45 AM 05/29/2021    7:59 AM 08/29/2020   10:01 AM  Depression screen PHQ 2/9  Decreased Interest 0 0 0 0 0  Down, Depressed, Hopeless 0 0 0 0 0  PHQ - 2 Score 0 0 0 0 0  Altered sleeping  1 1    Tired, decreased energy  0 0    Change in appetite  0 0    Feeling bad or failure about yourself   0 0    Trouble concentrating  0 0    Moving slowly or fidgety/restless  0 0    Suicidal thoughts  0 0    PHQ-9 Score  1 1      Allergies  Allergen Reactions   Sulfa Antibiotics     Unknown reaction in childhood   Social History   Social History Narrative   Married to [place]. 2 children [place].    BS degree.    Wear seatbelt. Wears a bicycle helmet. Smoke detector in the home.   Firearms in the home.   Feels safe in  Relationships.   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   Past Medical History:  Diagnosis Date   Arthritis    hands and feet   Cutaneous skin tags 01/09/2022   Dysrhythmia    a-fib   Elevated LFTs    Hearing aid worn 2021   right   Hyperlipidemia    Persistent atrial fibrillation (HCC)    RLS (restless legs syndrome)    Sleep apnea    does not use CPAP   Past Surgical History:  Procedure Laterality Date   ATRIAL FIBRILLATION ABLATION  2015   BONE EXCISION Right 05/09/2021   Procedure: RIGHT TIBIA EXCISION;  Surgeon: Hiram Gash, MD;  Location: [facility];  Service: Orthopedics;  Laterality: Right;   COLONOSCOPY     EXCISION MORTON'S NEUROMA Right 05/09/2021   Procedure: EXCISION OF RIGHT SECOND TOE MASS;  Surgeon: Erle Crocker, MD;  Location: [facility];  Service: Orthopedics;  Laterality: Right;   FOOT SURGERY Left    HERNIA REPAIR  LUMBAR [place] SURGERY  2012   L3-L4, No hardware   Family History  Problem Relation Age of Onset   Diabetes Mother    Diabetes Brother    Early death Brother    Heart disease Brother    Colon cancer Neg Hx    Esophageal cancer Neg Hx    Rectal cancer Neg Hx    Stomach cancer Neg Hx    Liver disease Neg Hx    Pancreatic cancer Neg Hx    Allergies as of 01/26/2023       Reactions   Sulfa Antibiotics    Unknown reaction in childhood        Medication List        Accurate as of January 26, 2023  8:58 AM. If you have any questions, ask your nurse or doctor.          allopurinol 100 MG tablet Commonly known as: ZYLOPRIM Take 2 tablets (200 mg total) by mouth daily.   atorvastatin 40 MG tablet Commonly known as: LIPITOR Take 1 tablet (40 mg total) by mouth daily.   colchicine 0.6 MG tablet 1 tab po with onset of gout, repeat 1 tab po in 3 hours.Then 1 tab po daily until resolved.   dofetilide 500 MCG capsule Commonly known as: TIKOSYN TAKE 1 CAPSULE BY MOUTH 2 TIMES DAILY.    escitalopram 5 MG tablet Commonly known as: LEXAPRO Take 1 tablet (5 mg total) by mouth daily.   metoprolol tartrate 25 MG tablet Commonly known as: LOPRESSOR Take 1 tablet (25 mg total) by mouth 2 (two) times daily as needed (HR >110).   tamsulosin 0.4 MG Caps capsule Commonly known as: FLOMAX Take 1 capsule (0.4 mg total) by mouth daily.        All past medical history, surgical history, allergies, family history, immunizations andmedications were updated in the EMR today and reviewed under the history and medication portions of their EMR.     ROS Negative, with the exception of above mentioned in HPI   Objective:  BP 133/71   Pulse 63   Temp 97.8 F (36.6 C) (Oral)   Ht '6\' 2"'$  (1.88 m)   Wt 234 lb (106.1 kg)   SpO2 97%   BMI 30.04 kg/m  Body mass index is 30.04 kg/m. Physical Exam Vitals and nursing note reviewed.  Constitutional:      General: He is not in acute distress.    Appearance: Normal appearance. He is not ill-appearing, toxic-appearing or diaphoretic.  HENT:     Head: Normocephalic and atraumatic.  Eyes:     General: No scleral icterus.       Right eye: No discharge.        Left eye: No discharge.     Extraocular Movements: Extraocular movements intact.     Pupils: Pupils are equal, round, and reactive to light.  Musculoskeletal:        General: Swelling and tenderness present.     Comments: Left elbow: mild erythema surrounding swollen olecranon bursa.   Skin:    General: Skin is warm and dry.     Coloration: Skin is not jaundiced or pale.     Findings: No rash.  Neurological:     Mental Status: He is alert and oriented to person, place, and time. Mental status is at baseline.  Psychiatric:        Mood and Affect: Mood normal.        Behavior: Behavior normal.  Thought Content: Thought content normal.        Judgment: Judgment normal.     No results found. No results found. No results found for this or any previous visit (from  the past 24 hour(s)).  Assessment/Plan: Johnathan [name] is a 67 y.o. male present for OV for  Elbow swelling, left/Olecranon bursitis of left elbow Left elbow bursitis aspirated without complications today. Pt tolerated.  - doxy bid x7 days F/u prn  Procedure Note:  PROCEDURE NOTE: aspiration left elbow joint Performed by: Dr. Raoul Pitch Indication: olecranon bursitis.  Anesthesia was obtained with ethyl chloride spray. The area was prepped in the usual sterile fashion. 10cc and 18 g needle was used to aspirate elbow with 7.5 cc serous fluid.  Culture was not obtained. . A pressure dressing was placed over the site. The patient tolerated the procedure well. Wound care instructions were given. Patient to follow up tomorrow.  Post-Procedure Diagnosis: olecranon bursitis  Complications: None Estimated Blood Loss:  none   Reviewed expectations re: course of current medical issues. Discussed self-management of symptoms. Outlined signs and symptoms indicating need for more acute intervention. Patient verbalized understanding and all questions were answered. Patient received an After-Visit Summary.    No orders of the defined types were placed in this encounter.  No orders of the defined types were placed in this encounter.  Referral Orders  No referral(s) requested today     Note is dictated utilizing voice recognition software. Although note has been proof read prior to signing, occasional typographical errors still can be missed. If any questions arise, please do not hesitate to call for verification.   electronically signed by:  Howard Pouch, DO  [place]

## 2023-01-28 ENCOUNTER — Telehealth: Payer: Self-pay | Admitting: Family Medicine

## 2023-01-28 ENCOUNTER — Telehealth: Payer: Self-pay | Admitting: Cardiovascular Disease

## 2023-01-28 ENCOUNTER — Encounter: Payer: Self-pay | Admitting: Family Medicine

## 2023-01-28 ENCOUNTER — Ambulatory Visit: Payer: 59 | Admitting: Family Medicine

## 2023-01-28 VITALS — BP 131/77 | HR 70 | Temp 98.3°F | Wt 250.0 lb

## 2023-01-28 DIAGNOSIS — M7022 Olecranon bursitis, left elbow: Secondary | ICD-10-CM

## 2023-01-28 MED ORDER — NAPROXEN 500 MG PO TABS: 500.0000 mg | ORAL_TABLET | Freq: Two times a day (BID) | ORAL | 0 refills | Status: DC

## 2023-01-28 NOTE — Telephone Encounter (Signed)
Patient is calling stating his PCP has prescribed him Naproxen for some elbow issues. He is wanting to confirm this is okay to take due to being on the way to get it now. Please advise.

## 2023-01-28 NOTE — Telephone Encounter (Signed)
Spoke to patient; scheduled appt for 2/1 at 11:20AM

## 2023-01-28 NOTE — Telephone Encounter (Signed)
Can be added on Thursday in the 1120 slot or Friday in an open slot.  I do not have anything sooner unfortunately.

## 2023-01-28 NOTE — Telephone Encounter (Signed)
Pt reports that since his left arm has been drained on [place], it has swollen up again. I informed him Dr. Raoul Pitch was booked today and tomorrow and he proceeds to say talk to her she know the situation. Please contact the patient to discuss possible appointment times.

## 2023-01-28 NOTE — Telephone Encounter (Signed)
Please advise 

## 2023-01-28 NOTE — Patient Instructions (Addendum)
No follow-ups on file.   Keep pressure dressing from today in place for 48 hours, then replace with new pressure dressing for 1 week.      Great to see you today.  I have refilled the medication(s) we provide.   If labs were collected, we will inform you of lab results once received either by echart message or telephone call.   - echart message- for normal results that have been seen by the patient already.   - telephone call: abnormal results or if patient has not viewed results in their echart.'

## 2023-01-28 NOTE — Progress Notes (Signed)
Johnathan [name] , [birth year], 67 y.o., male MRN: [id number] Patient Care Team    Relationship Specialty Notifications Start End  Ma Hillock, DO PCP - General Family Medicine  04/15/17   Lorretta Harp, MD PCP - Cardiology Cardiology  12/21/19   Con Memos PCP - Pulmonology Family Medicine  12/21/19   Lorretta Harp, MD Consulting Physician Cardiology  03/22/18   Irene Shipper, MD Consulting Physician Gastroenterology  07/19/19   Dene Gentry, MD Consulting Physician Sports Medicine  03/07/20   Hiram Gash, MD Consulting Physician Orthopedic Surgery  03/07/20   Troy Sine, MD Consulting Physician Cardiology  04/02/20     Chief Complaint  Patient presents with   Joint Swelling     Subjective: Johnathan [name] is a 67 y.o. male present for recurrent olecranon bursitis.  Patient underwent aspiration of olecranon bursitis 01/26/2023, tolerated procedure well.  Reports he kept the pressure dressing in place, he noticed it was refilling when he took the pressure off of it today to change the bandage.  He reports he has been at his desk more, possibly resting his elbows on the desk.  He also started working out just about 2 weeks ago, both of these new activities occurred around the same time prior to onset of bursitis.  Patient denies any fevers or chills.  Denies any drainage from the site but it has remained red and swollen.  Prior note. Pt presents for an OV with complaints of left elbow swelling of 1.5 weeks duration.  Associated symptoms include tenderness and redness. He denies fever, chills, nausea. He has a h/o gout. He has had bursitis in the past.  Pt has tried nsaids to ease their symptoms.      01/26/2023    8:51 AM 08/01/2022    4:05 PM 01/09/2022    8:45 AM 05/29/2021    7:59 AM 08/29/2020   10:01 AM  Depression screen PHQ 2/9  Decreased Interest 0 0 0 0 0  Down, Depressed, Hopeless 0 0 0 0 0  PHQ - 2 Score 0 0 0 0 0  Altered sleeping  1 1    Tired, decreased  energy  0 0    Change in appetite  0 0    Feeling bad or failure about yourself   0 0    Trouble concentrating  0 0    Moving slowly or fidgety/restless  0 0    Suicidal thoughts  0 0    PHQ-9 Score  1 1      Allergies  Allergen Reactions   Sulfa Antibiotics     Unknown reaction in childhood   Social History   Social History Narrative   Married to [place]. 2 children [place].    BS degree.    Wear seatbelt. Wears a bicycle helmet. Smoke detector in the home.   Firearms in the home.   Feels safe in  Relationships.   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   Past Medical History:  Diagnosis Date   Arthritis    hands and feet   Cutaneous skin tags 01/09/2022   Dysrhythmia    a-fib   Elevated LFTs    Hearing aid worn 2021   right   Hyperlipidemia    Persistent atrial fibrillation (HCC)    RLS (restless legs syndrome)  Sleep apnea    does not use CPAP   Past Surgical History:  Procedure Laterality Date   ATRIAL FIBRILLATION ABLATION  2015   BONE EXCISION Right 05/09/2021   Procedure: RIGHT TIBIA EXCISION;  Surgeon: Hiram Gash, MD;  Location: [facility];  Service: Orthopedics;  Laterality: Right;   COLONOSCOPY     EXCISION MORTON'S NEUROMA Right 05/09/2021   Procedure: EXCISION OF RIGHT SECOND TOE MASS;  Surgeon: Erle Crocker, MD;  Location: [place];  Service: Orthopedics;  Laterality: Right;   FOOT SURGERY Left    HERNIA REPAIR     LUMBAR [place] SURGERY  2012   L3-L4, No hardware   Family History  Problem Relation Age of Onset   Diabetes Mother    Diabetes Brother    Early death Brother    Heart disease Brother    Colon cancer Neg Hx    Esophageal cancer Neg Hx    Rectal cancer Neg Hx    Stomach cancer Neg Hx    Liver disease Neg Hx    Pancreatic cancer Neg Hx    Allergies as of 01/28/2023       Reactions   Sulfa  Antibiotics    Unknown reaction in childhood        Medication List        Accurate as of January 28, 2023  4:54 PM. If you have any questions, ask your nurse or doctor.          allopurinol 100 MG tablet Commonly known as: ZYLOPRIM Take 2 tablets (200 mg total) by mouth daily.   atorvastatin 40 MG tablet Commonly known as: LIPITOR Take 1 tablet (40 mg total) by mouth daily.   colchicine 0.6 MG tablet 1 tab po with onset of gout, repeat 1 tab po in 3 hours.Then 1 tab po daily until resolved.   dofetilide 500 MCG capsule Commonly known as: TIKOSYN TAKE 1 CAPSULE BY MOUTH 2 TIMES DAILY.   doxycycline 100 MG tablet Commonly known as: VIBRA-TABS Take 1 tablet (100 mg total) by mouth 2 (two) times daily.   escitalopram 5 MG tablet Commonly known as: LEXAPRO Take 1 tablet (5 mg total) by mouth daily.   metoprolol tartrate 25 MG tablet Commonly known as: LOPRESSOR Take 1 tablet (25 mg total) by mouth 2 (two) times daily as needed (HR >110).   naproxen 500 MG tablet Commonly known as: Naprosyn Take 1 tablet (500 mg total) by mouth 2 (two) times daily with a meal. Started by: Howard Pouch, DO   tamsulosin 0.4 MG Caps capsule Commonly known as: FLOMAX Take 1 capsule (0.4 mg total) by mouth daily.        All past medical history, surgical history, allergies, family history, immunizations andmedications were updated in the EMR today and reviewed under the history and medication portions of their EMR.     ROS Negative, with the exception of above mentioned in HPI   Objective:  BP 131/77   Pulse 70   Temp 98.3 F (36.8 C)   Wt 250 lb (113.4 kg)   SpO2 99%   BMI 32.10 kg/m  Body mass index is 32.1 kg/m. Physical Exam Vitals and nursing note reviewed.  Constitutional:      General: He is not in acute distress.    Appearance: Normal appearance. He is not ill-appearing, toxic-appearing or diaphoretic.  HENT:     Head: Normocephalic and atraumatic.  Eyes:      General:  No scleral icterus.       Right eye: No discharge.        Left eye: No discharge.     Extraocular Movements: Extraocular movements intact.     Pupils: Pupils are equal, round, and reactive to light.  Cardiovascular:     Rate and Rhythm: Normal rate.  Pulmonary:     Effort: Pulmonary effort is normal.  Musculoskeletal:        General: Swelling and tenderness present.     Comments: Left elbow: mild erythema surrounding swollen olecranon bursa.   Skin:    General: Skin is warm and dry.     Coloration: Skin is not jaundiced or pale.     Findings: Erythema present. No rash.  Neurological:     Mental Status: He is alert and oriented to person, place, and time. Mental status is at baseline.     No results found. No results found. No results found for this or any previous visit (from the past 24 hour(s)).  Assessment/Plan: Johnathan [name] is a 67 y.o. male present for OV for  Elbow swelling, left/Olecranon bursitis of left elbow Recurring left olecranon bursitis aspirated again today with another 8 cc of serous fluid.  Patient tolerated procedure well. - doxy bid x7 days> prior script> finish naproxen BID for pain/swelling Encouraged him to purchase a elbow bursitis brace/pad and wear for the next 7-14 days until area is completely healed.  Attempt to keep elbows off of the desk. Keep pressure dressing in place for 48 hours, if loosens can place bursitis brace over for more support. F/u prn  Procedure Note:  PROCEDURE NOTE: aspiration left elbow joint Performed by: Dr. Raoul Pitch Indication: olecranon bursitis.  Anesthesia was obtained with ethyl chloride spray. The area was prepped in the usual sterile fashion. 10cc and 18 g needle was used to aspirate elbow with 8 cc serous fluid.  Culture was not obtained. . A pressure dressing was placed over the site.  Tolerated procedure well wound care instructions were given.  Post-Procedure Diagnosis: olecranon bursitis   Complications: None Estimated Blood Loss:  none   Reviewed expectations re: course of current medical issues. Discussed self-management of symptoms. Outlined signs and symptoms indicating need for more acute intervention. Patient verbalized understanding and all questions were answered. Patient received an After-Visit Summary.    No orders of the defined types were placed in this encounter.  Meds ordered this encounter  Medications   naproxen (NAPROSYN) 500 MG tablet    Sig: Take 1 tablet (500 mg total) by mouth 2 (two) times daily with a meal.    Dispense:  30 tablet    Refill:  0   Referral Orders  No referral(s) requested today     Note is dictated utilizing voice recognition software. Although note has been proof read prior to signing, occasional typographical errors still can be missed. If any questions arise, please do not hesitate to call for verification.   electronically signed by:  Howard Pouch, DO  [place]

## 2023-01-28 NOTE — Telephone Encounter (Signed)
Returned call to patient and made him aware that I spoke with PharmD Gerald Stabs) who states that this is fine to take but would limit to just take as long as he needs it. Patient aware and verbalized understanding. Advised patient to call back to office with any issues, questions, or concerns. Patient verbalized understanding.

## 2023-01-28 NOTE — Telephone Encounter (Signed)
Pt sched today at 4

## 2023-01-29 ENCOUNTER — Ambulatory Visit: Payer: 59 | Admitting: Family Medicine

## 2023-01-30 ENCOUNTER — Telehealth: Payer: Self-pay

## 2023-01-30 NOTE — Telephone Encounter (Signed)
Spoke with pt re instructions

## 2023-01-30 NOTE — Telephone Encounter (Signed)
Pt reports L forearm and hand swelling. Denies redness and warmth.   Please advise.

## 2023-01-30 NOTE — Telephone Encounter (Signed)
If his hand is also swelling, that sounds like the compression is a little too tight.

## 2023-02-02 ENCOUNTER — Telehealth: Payer: Self-pay

## 2023-02-02 NOTE — Telephone Encounter (Signed)
Patient called to give status of patient elbow/forearm/hand from last week.  Patient was seen twice last week by Dr. Raoul Pitch.  He states that his hand is still swollen.  It is better than it was last week but still swollen.  Patient is asking if he needs to come back in to see Dr. Raoul Pitch.  Please call [phone]

## 2023-02-02 NOTE — Telephone Encounter (Signed)
[facility] - Client TELEPHONE ADVICE RECORD [facility] Patient Name: Johnathan [name] Gender: Male DOB: 12/09/1956 Age: 67 Y 10 M 28 D Return Phone Number: [phone] (Primary) Address: City/ State/ Zip: [place]  [address] [facility] - Client Client Site [facility] - Day Provider Raoul Pitch, [name] Type Call Who Is Calling Patient / Member / Family / Caregiver Call Type Triage / Clinical Relationship To Patient Self Return Phone Number [phone] (Primary) Chief Complaint Swelling (generalized) Reason for Call Symptomatic / Request for [place] states he had his elbow drained twice this week. Now his hand is swelling up as well. Translation No Nurse Assessment Nurse: Cox, RN, [place] Date/Time (Eastern Time): 01/31/2023 4:50:29 PM Confirm and document reason for call. If symptomatic, describe symptoms. ---Caller states he had his elbow drained twice this week and is taking antibiotics and anti-inflammatory medications. Now caller reports that his hand is swelling up as well, denies any redness on the hand, just puffy wrist and fingers. No fever. Caller reports that PCP had told him to keep the wrap on for a week when he was seen in the office on Wednesday and that the hand is swelling is worse than when he talked to her last night. Does the patient have any new or worsening symptoms? ---Yes Will a triage be completed? ---Yes Related visit to physician within the last 2 weeks? ---Yes Does the PT have any chronic conditions? (i.e. diabetes, asthma, this includes High risk factors for pregnancy, etc.) ---Yes List chronic conditions. ---A-fib Is this a behavioral health or substance abuse call? ---No Guidelines Guideline Title Affirmed Question Affirmed Notes Nurse Date/Time Eilene Ghazi Time) Wrist Swelling MILD OR MODERATE joint swelling (e.g.,  feels or looks mildly swollen or puffy) Cox, RN, [facility] 01/31/2023 4:54:02 PM PLEASE NOTE: All timestamps contained within this report are represented as [place] Standard Time. CONFIDENTIALTY NOTICE: This fax transmission is intended only for the addressee. It contains information that is legally privileged, confidential or otherwise protected from use or disclosure. If you are not the intended recipient, you are strictly prohibited from reviewing, disclosing, copying using or disseminating any of this information or taking any action in reliance on or regarding this information. If you have received this fax in error, please notify us immediately by telephone so that we can arrange for its return to [place]. Phone: [phone], Toll-Free: [phone], Fax: [phone] Page: 2 of 2 Call Id: [id number] [place]. Time Eilene Ghazi Time) Disposition Final User 01/31/2023 4:41:46 PM Attempt made - message left Cox, RN, [facility] 01/31/2023 4:58:20 PM SEE PCP WITHIN 3 DAYS Yes Cox, RN, [employer] Final Disposition 01/31/2023 4:58:20 PM SEE PCP WITHIN 3 DAYS Yes Cox, RN, Catering manager Understands Yes PreDisposition Call Doctor Care Advice Given Per Guideline SEE PCP WITHIN 3 DAYS: * You need to be seen within 2 or 3 days. * PCP VISIT: Call your doctor (or NP/PA) during regular office hours and make an appointment. A clinic or urgent care center are good places to go for care if your doctor's office is closed or you can't get an appointment. NOTE: If office will be open tomorrow, tell caller to call then, not in 3 days. CALL BACK IF: * Fever occurs * You become worse CARE ADVICE given per Wrist Swelling (Adult) guideline. Referrals REFERRED TO PCP OFFICE

## 2023-02-02 NOTE — Telephone Encounter (Signed)
Spoke with patient regarding results/recommendations. Pt is scheduled for 02/03/23

## 2023-02-03 ENCOUNTER — Encounter: Payer: Self-pay | Admitting: Family Medicine

## 2023-02-03 ENCOUNTER — Ambulatory Visit: Payer: 59 | Admitting: Family Medicine

## 2023-02-03 VITALS — BP 126/73 | HR 64 | Temp 98.6°F | Wt 238.0 lb

## 2023-02-03 DIAGNOSIS — M7022 Olecranon bursitis, left elbow: Secondary | ICD-10-CM | POA: Diagnosis not present

## 2023-02-03 NOTE — Progress Notes (Signed)
Johnathan [name] , 12/05/1956, 67 y.o., male MRN: [id number] Patient Care Team    Relationship Specialty Notifications Start End  Ma Hillock, DO PCP - General Family Medicine  04/15/17   Lorretta Harp, MD PCP - Cardiology Cardiology  12/21/19   Con Memos PCP - Pulmonology Family Medicine  12/21/19   Lorretta Harp, MD Consulting Physician Cardiology  03/22/18   Irene Shipper, MD Consulting Physician Gastroenterology  07/19/19   Dene Gentry, MD Consulting Physician Sports Medicine  03/07/20   Hiram Gash, MD Consulting Physician Orthopedic Surgery  03/07/20   Troy Sine, MD Consulting Physician Cardiology  04/02/20     Chief Complaint  Patient presents with   Edema     Subjective: Johnathan [name] is a 67 y.o. male present for follow up olecranon bursitis. He reports he has purchased the pressure/olecranon bursitis padded sleeve. It has helped a great deal. He did note some mild hand swelling when he first placed sleeve, but since it has returned to normal.   Prior note: Patient underwent aspiration of olecranon bursitis 01/26/2023, tolerated procedure well.  Reports he kept the pressure dressing in place, he noticed it was refilling when he took the pressure off of it today to change the bandage.  He reports he has been at his desk more, possibly resting his elbows on the desk.  He also started working out just about 2 weeks ago, both of these new activities occurred around the same time prior to onset of bursitis.  Patient denies any fevers or chills.  Denies any drainage from the site but it has remained red and swollen.  Prior note. Pt presents for an OV with complaints of left elbow swelling of 1.5 weeks duration.  Associated symptoms include tenderness and redness. He denies fever, chills, nausea. He has a h/o gout. He has had bursitis in the past.  Pt has tried nsaids to ease their symptoms.      01/26/2023    8:51 AM 08/01/2022    4:05 PM 01/09/2022    8:45  AM 05/29/2021    7:59 AM 08/29/2020   10:01 AM  Depression screen PHQ 2/9  Decreased Interest 0 0 0 0 0  Down, Depressed, Hopeless 0 0 0 0 0  PHQ - 2 Score 0 0 0 0 0  Altered sleeping  1 1    Tired, decreased energy  0 0    Change in appetite  0 0    Feeling bad or failure about yourself   0 0    Trouble concentrating  0 0    Moving slowly or fidgety/restless  0 0    Suicidal thoughts  0 0    PHQ-9 Score  1 1      Allergies  Allergen Reactions   Sulfa Antibiotics     Unknown reaction in childhood   Social History   Social History Narrative   Married to Johnathan [name]. 2 children [place].    BS degree.    Wear seatbelt. Wears a bicycle helmet. Smoke detector in the home.   Firearms in the home.   Feels safe in  Relationships.   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   Past Medical History:  Diagnosis Date   Arthritis    hands and feet   Cutaneous skin tags 01/09/2022  Dysrhythmia    a-fib   Elevated LFTs    Hearing aid worn 2021   right   Hyperlipidemia    Persistent atrial fibrillation (HCC)    RLS (restless legs syndrome)    Sleep apnea    does not use CPAP   Past Surgical History:  Procedure Laterality Date   ATRIAL FIBRILLATION ABLATION  2015   BONE EXCISION Right 05/09/2021   Procedure: RIGHT TIBIA EXCISION;  Surgeon: Hiram Gash, MD;  Location: [place];  Service: Orthopedics;  Laterality: Right;   COLONOSCOPY     EXCISION MORTON'S NEUROMA Right 05/09/2021   Procedure: EXCISION OF RIGHT SECOND TOE MASS;  Surgeon: Erle Crocker, MD;  Location: [facility];  Service: Orthopedics;  Laterality: Right;   FOOT SURGERY Left    HERNIA REPAIR     LUMBAR [place] SURGERY  2012   L3-L4, No hardware   Family History  Problem Relation Age of Onset   Diabetes Mother    Diabetes Brother    Early death Brother    Heart disease Brother    Colon  cancer Neg Hx    Esophageal cancer Neg Hx    Rectal cancer Neg Hx    Stomach cancer Neg Hx    Liver disease Neg Hx    Pancreatic cancer Neg Hx    Allergies as of 02/03/2023       Reactions   Sulfa Antibiotics    Unknown reaction in childhood        Medication List        Accurate as of February 03, 2023  3:33 PM. If you have any questions, ask your nurse or doctor.          allopurinol 100 MG tablet Commonly known as: ZYLOPRIM Take 2 tablets (200 mg total) by mouth daily.   atorvastatin 40 MG tablet Commonly known as: LIPITOR Take 1 tablet (40 mg total) by mouth daily.   colchicine 0.6 MG tablet 1 tab po with onset of gout, repeat 1 tab po in 3 hours.Then 1 tab po daily until resolved.   dofetilide 500 MCG capsule Commonly known as: TIKOSYN TAKE 1 CAPSULE BY MOUTH 2 TIMES DAILY.   doxycycline 100 MG tablet Commonly known as: VIBRA-TABS Take 1 tablet (100 mg total) by mouth 2 (two) times daily.   escitalopram 5 MG tablet Commonly known as: LEXAPRO Take 1 tablet (5 mg total) by mouth daily.   metoprolol tartrate 25 MG tablet Commonly known as: LOPRESSOR Take 1 tablet (25 mg total) by mouth 2 (two) times daily as needed (HR >110).   naproxen 500 MG tablet Commonly known as: Naprosyn Take 1 tablet (500 mg total) by mouth 2 (two) times daily with a meal.   tamsulosin 0.4 MG Caps capsule Commonly known as: FLOMAX Take 1 capsule (0.4 mg total) by mouth daily.        All past medical history, surgical history, allergies, family history, immunizations andmedications were updated in the EMR today and reviewed under the history and medication portions of their EMR.     ROS Negative, with the exception of above mentioned in HPI   Objective:  BP 126/73   Pulse 64   Temp 98.6 F (37 C)   Wt 238 lb (108 kg)   SpO2 98%   BMI 30.56 kg/m  Body mass index is 30.56 kg/m. Physical Exam Vitals and nursing note reviewed.  Constitutional:      General: He is  not in acute distress.    Appearance: Normal appearance. He is not ill-appearing, toxic-appearing or diaphoretic.  HENT:     Head: Normocephalic and atraumatic.  Eyes:     General: No scleral icterus.       Right eye: No discharge.        Left eye: No discharge.     Extraocular Movements: Extraocular movements intact.     Pupils: Pupils are equal, round, and reactive to light.  Cardiovascular:     Rate and Rhythm: Normal rate.  Pulmonary:     Effort: Pulmonary effort is normal.  Musculoskeletal:        General: No swelling or tenderness.  Skin:    General: Skin is warm and dry.     Coloration: Skin is not jaundiced or pale.     Findings: No erythema or rash.  Neurological:     Mental Status: He is alert and oriented to person, place, and time. Mental status is at baseline.     No results found. No results found. No results found for this or any previous visit (from the past 24 hour(s)).  Assessment/Plan: Johnathan [name] is a 67 y.o. male present for OV for  Elbow swelling, left/Olecranon bursitis of left elbow Much improved.  Continued wearing sleeve during office work and work outs to help prevent as needed.  No further intervention   Reviewed expectations re: course of current medical issues. Discussed self-management of symptoms. Outlined signs and symptoms indicating need for more acute intervention. Patient verbalized understanding and all questions were answered. Patient received an After-Visit Summary.    No orders of the defined types were placed in this encounter.  No orders of the defined types were placed in this encounter.  Referral Orders  No referral(s) requested today     Note is dictated utilizing voice recognition software. Although note has been proof read prior to signing, occasional typographical errors still can be missed. If any questions arise, please do not hesitate to call for verification.   electronically signed by:  Howard Pouch, DO   [place]

## 2023-02-04 ENCOUNTER — Telehealth: Payer: Self-pay | Admitting: Cardiovascular Disease

## 2023-02-04 NOTE — Telephone Encounter (Signed)
Pt c/o medication issue:  1. Name of Medication: Tylenol Cold Flu Severe  2. How are you currently taking this medication (dosage and times per day)?   3. Are you having a reaction (difficulty breathing--STAT)?   4. What is your medication issue? Patient wants to know if he can take this medicine with his Afib condition? If he can not take this, he wants to know what can he take?

## 2023-02-04 NOTE — Telephone Encounter (Signed)
Patient wants to know what Cold Medication is safe for him to take. Informed patient that Coricidin was a safe choice. Appreciative for call, no additional questions.

## 2023-02-05 ENCOUNTER — Telehealth: Payer: 59 | Admitting: Physician Assistant

## 2023-02-05 ENCOUNTER — Telehealth: Payer: Self-pay | Admitting: Cardiovascular Disease

## 2023-02-05 DIAGNOSIS — U071 COVID-19: Secondary | ICD-10-CM | POA: Diagnosis not present

## 2023-02-05 MED ORDER — MOLNUPIRAVIR EUA 200MG CAPSULE: 4.0000 | ORAL_CAPSULE | Freq: Two times a day (BID) | ORAL | 0 refills | Status: AC

## 2023-02-05 NOTE — Progress Notes (Signed)
Virtual Visit Consent   Johnathan [name], you are scheduled for a virtual visit with a [facility] provider today. Just as with appointments in the office, your consent must be obtained to participate. Your consent will be active for this visit and any virtual visit you may have with one of our providers in the next 365 days. If you have a MyChart account, a copy of this consent can be sent to you electronically.  As this is a virtual visit, video technology does not allow for your provider to perform a traditional examination. This may limit your provider's ability to fully assess your condition. If your provider identifies any concerns that need to be evaluated in person or the need to arrange testing (such as labs, EKG, etc.), we will make arrangements to do so. Although advances in technology are sophisticated, we cannot ensure that it will always work on either your end or our end. If the connection with a video visit is poor, the visit may have to be switched to a telephone visit. With either a video or telephone visit, we are not always able to ensure that we have a secure connection.  By engaging in this virtual visit, you consent to the provision of healthcare and authorize for your insurance to be billed (if applicable) for the services provided during this visit. Depending on your insurance coverage, you may receive a charge related to this service.  I need to obtain your verbal consent now. Are you willing to proceed with your visit today? Delvecchio Madole has provided verbal consent on 02/05/2023 for a virtual visit (video or telephone). Johnathan [name], [place]  Date: 02/05/2023 3:34 PM  Virtual Visit via Video Note   I, Johnathan [name], connected with  Jock Mahon  ([id number], 07/05/1956) on 02/05/23 at  3:30 PM EST by a video-enabled telemedicine application and verified that I am speaking with the correct person using two identifiers.  Location: Patient: Virtual Visit Location Patient:  Home Provider: Virtual Visit Location Provider: Home Office   I discussed the limitations of evaluation and management by telemedicine and the availability of in person appointments. The patient expressed understanding and agreed to proceed.    History of Present Illness: Johnathan [name] is a 67 y.o. who identifies as a male who was assigned male at birth, and is being seen today for COVID-19. Endorses symptoms starting Tuesday night with cough and congestion. Wednesday with continued symptoms, headache. Cough is improved, but increased sinus congestion. Denies chest pain or SOB. Denies GI symptoms.  Took a home COVID test yesterday which came back positive. Has had COVID before but no prior hospitalizations.   OTC -- Coricidin HBP  HPI: HPI  Problems:  Patient Active Problem List   Diagnosis Date Noted   Johnathan [name] disease 10/20/2019   Tinnitus aurium, right 03/29/2019   BPH associated with nocturia 12/20/2018   OSA on CPAP 03/22/2018   Primary osteoarthritis of first carpometacarpal joint of right hand 05/07/2017   Permanent atrial fibrillation ([place]) 04/15/2017   Chronic pain of right thumb 04/15/2017   Anxiety 04/15/2017   Long-term use of high-risk medication 04/15/2017   Hyperlipidemia    Atypical atrial flutter ([place]) 09/10/2015    Allergies:  Allergies  Allergen Reactions   Sulfa Antibiotics     Unknown reaction in childhood   Medications:  Current Outpatient Medications:    molnupiravir EUA (LAGEVRIO) 200 mg CAPS capsule, Take 4 capsules (800 mg total) by mouth 2 (two) times daily for 5  days., Disp: 40 capsule, Rfl: 0   allopurinol (ZYLOPRIM) 100 MG tablet, Take 2 tablets (200 mg total) by mouth daily., Disp: 180 tablet, Rfl: 3   atorvastatin (LIPITOR) 40 MG tablet, Take 1 tablet (40 mg total) by mouth daily., Disp: 90 tablet, Rfl: 3   colchicine 0.6 MG tablet, 1 tab po with onset of gout, repeat 1 tab po in 3 hours.Then 1 tab po daily until resolved., Disp: 30 tablet, Rfl: 11    dofetilide (TIKOSYN) 500 MCG capsule, TAKE 1 CAPSULE BY MOUTH 2 TIMES DAILY., Disp: 180 capsule, Rfl: 3   escitalopram (LEXAPRO) 5 MG tablet, Take 1 tablet (5 mg total) by mouth daily., Disp: 90 tablet, Rfl: 3   metoprolol tartrate (LOPRESSOR) 25 MG tablet, Take 1 tablet (25 mg total) by mouth 2 (two) times daily as needed (HR >110)., Disp: 60 tablet, Rfl: 2   naproxen (NAPROSYN) 500 MG tablet, Take 1 tablet (500 mg total) by mouth 2 (two) times daily with a meal., Disp: 30 tablet, Rfl: 0   tamsulosin (FLOMAX) 0.4 MG CAPS capsule, Take 1 capsule (0.4 mg total) by mouth daily., Disp: 90 capsule, Rfl: 3  Observations/Objective: Patient is well-developed, well-nourished in no acute distress.  Resting comfortably at home.  Head is normocephalic, atraumatic.  No labored breathing. Speech is clear and coherent with logical content.  Patient is alert and oriented at baseline.   Assessment and Plan: 1. COVID-19 - molnupiravir EUA (LAGEVRIO) 200 mg CAPS capsule; Take 4 capsules (800 mg total) by mouth 2 (two) times daily for 5 days.  Dispense: 40 capsule; Refill: 0  Patient with multiple risk factors for complicated course of illness. Discussed risks/benefits of antiviral medications including most common potential ADRs. Patient voiced understanding and would like to proceed with antiviral medication. They are candidate for Molnupiravir (Paxlovid not a good option giving his Tikosyn). Rx sent to pharmacy. Supportive measures, OTC medications and vitamin regimen reviewed. Patient has been enrolled in a MyChart COVID symptom monitoring program. Samule Dry reviewed in detail. Strict ER precautions discussed with patient.    Follow Up Instructions: I discussed the assessment and treatment plan with the patient. The patient was provided an opportunity to ask questions and all were answered. The patient agreed with the plan and demonstrated an understanding of the instructions.  A copy of instructions were  sent to the patient via MyChart unless otherwise noted below.   The patient was advised to call back or seek an in-person evaluation if the symptoms worsen or if the condition fails to improve as anticipated.  Time:  I spent 10 minutes with the patient via telehealth technology discussing the above problems/concerns.    Johnathan Rio, PA-C

## 2023-02-05 NOTE — Telephone Encounter (Signed)
Informed patient of PharmD reply: "Recommend avoiding Paxlovid due to drug interaction with Tikosyn. Would see if his PCP can prescribe molnupiravir instead which avoids drug interaction."  He stated he already picked up molnupiravir from his pharmacy.

## 2023-02-05 NOTE — Telephone Encounter (Signed)
Pt c/o medication issue:  1. Name of Medication:   dofetilide (TIKOSYN) 500 MCG capsule   2. How are you currently taking this medication (dosage and times per day)? As prescribed  3. Are you having a reaction (difficulty breathing--STAT)?   4. What is your medication issue?    Patient wants to know if it is OK for him to take Paxlovid.

## 2023-02-05 NOTE — Patient Instructions (Addendum)
Johnathan [name], thank you for joining [name], PA-C for today's virtual visit.  While this provider is not your primary care provider (PCP), if your PCP is located in our provider database this encounter information will be shared with them immediately following your visit.   [place] account gives you access to today's visit and all your visits, tests, and labs performed at [facility] " click here if you don't have a [facility] account or go to [email or web address]  Consent: (Patient) Johnathan [name] provided verbal consent for this virtual visit at the beginning of the encounter.  Current Medications:  Current Outpatient Medications:    allopurinol (ZYLOPRIM) 100 MG tablet, Take 2 tablets (200 mg total) by mouth daily., Disp: 180 tablet, Rfl: 3   atorvastatin (LIPITOR) 40 MG tablet, Take 1 tablet (40 mg total) by mouth daily., Disp: 90 tablet, Rfl: 3   colchicine 0.6 MG tablet, 1 tab po with onset of gout, repeat 1 tab po in 3 hours.Then 1 tab po daily until resolved., Disp: 30 tablet, Rfl: 11   dofetilide (TIKOSYN) 500 MCG capsule, TAKE 1 CAPSULE BY MOUTH 2 TIMES DAILY., Disp: 180 capsule, Rfl: 3   doxycycline (VIBRA-TABS) 100 MG tablet, Take 1 tablet (100 mg total) by mouth 2 (two) times daily. (Patient not taking: Reported on 02/03/2023), Disp: 14 tablet, Rfl: 0   escitalopram (LEXAPRO) 5 MG tablet, Take 1 tablet (5 mg total) by mouth daily., Disp: 90 tablet, Rfl: 3   metoprolol tartrate (LOPRESSOR) 25 MG tablet, Take 1 tablet (25 mg total) by mouth 2 (two) times daily as needed (HR >110)., Disp: 60 tablet, Rfl: 2   naproxen (NAPROSYN) 500 MG tablet, Take 1 tablet (500 mg total) by mouth 2 (two) times daily with a meal., Disp: 30 tablet, Rfl: 0   tamsulosin (FLOMAX) 0.4 MG CAPS capsule, Take 1 capsule (0.4 mg total) by mouth daily., Disp: 90 capsule, Rfl: 3   Medications ordered in this encounter:  No orders of the defined types were placed in  this encounter.    *If you need refills on other medications prior to your next appointment, please contact your pharmacy*  Follow-Up: Call back or seek an in-person evaluation if the symptoms worsen or if the condition fails to improve as anticipated.  [facility] [phone]  Other Instructions Please keep well-hydrated and get plenty of rest. Start a saline nasal rinse to flush out your nasal passages. You can use plain Mucinex to help thin congestion. If you have a humidifier, running in the bedroom at night. I want you to start OTC vitamin D3 1000 units daily, vitamin C 1000 mg daily, and a zinc supplement. Please take prescribed medications as directed.  You have been enrolled in a MyChart symptom monitoring program. Please answer these questions daily so we can keep track of how you are doing.  You were to quarantine for 5 days from onset of your symptoms.  After day 5, if you have had no fever and you are feeling better, you can end quarantine but need to mask for an additional 5 days. After day 5 if you have a fever or are having significant symptoms, please quarantine for full 10 days.  If you note any worsening of symptoms, any significant shortness of breath or any chest pain, please seek ER evaluation ASAP.  Please do not delay care!  COVID-19: What to Do if You Are Sick If you test positive and are an  older adult or someone who is at high risk of getting very sick from COVID-19, treatment may be available. Contact a healthcare provider right away after a positive test to determine if you are eligible, even if your symptoms are mild right now. You can also visit a Test to Treat location and, if eligible, receive a prescription from a provider. Don't delay: Treatment must be started within the first few days to be effective. If you have a fever, cough, or other symptoms, you might have COVID-19. Most people have mild illness and are able to recover at home. If  you are sick: Keep track of your symptoms. If you have an emergency warning sign (including trouble breathing), call 911. Steps to help prevent the spread of COVID-19 if you are sick If you are sick with COVID-19 or think you might have COVID-19, follow the steps below to care for yourself and to help protect other people in your home and community. Stay home except to get medical care Stay home. Most people with COVID-19 have mild illness and can recover at home without medical care. Do not leave your home, except to get medical care. Do not visit public areas and do not go to places where you are unable to wear a mask. Take care of yourself. Get rest and stay hydrated. Take over-the-counter medicines, such as acetaminophen, to help you feel better. Stay in touch with your doctor. Call before you get medical care. Be sure to get care if you have trouble breathing, or have any other emergency warning signs, or if you think it is an emergency. Avoid public transportation, ride-sharing, or taxis if possible. Get tested If you have symptoms of COVID-19, get tested. While waiting for test results, stay away from others, including staying apart from those living in your household. Get tested as soon as possible after your symptoms start. Treatments may be available for people with COVID-19 who are at risk for becoming very sick. Don't delay: Treatment must be started early to be effective--some treatments must begin within 5 days of your first symptoms. Contact your healthcare provider right away if your test result is positive to determine if you are eligible. Self-tests are one of several options for testing for the virus that causes COVID-19 and may be more convenient than laboratory-based tests and point-of-care tests. Ask your healthcare provider or your local health department if you need help interpreting your test results. You can visit your state, tribal, local, and territorial health department's  website to look for the latest local information on testing sites. Separate yourself from other people As much as possible, stay in a specific room and away from other people and pets in your home. If possible, you should use a separate bathroom. If you need to be around other people or animals in or outside of the home, wear a well-fitting mask. Tell your close contacts that they may have been exposed to COVID-19. An infected person can spread COVID-19 starting 48 hours (or 2 days) before the person has any symptoms or tests positive. By letting your close contacts know they may have been exposed to COVID-19, you are helping to protect everyone. See COVID-19 and Animals if you have questions about pets. If you are diagnosed with COVID-19, someone from the health department may call you. Answer the call to slow the spread. Monitor your symptoms Symptoms of COVID-19 include fever, cough, or other symptoms. Follow care instructions from your healthcare provider and local health department. Your local  health authorities may give instructions on checking your symptoms and reporting information. When to seek emergency medical attention Look for emergency warning signs* for COVID-19. If someone is showing any of these signs, seek emergency medical care immediately: Trouble breathing Persistent pain or pressure in the chest New confusion Inability to wake or stay awake Pale, gray, or blue-colored skin, lips, or nail beds, depending on skin tone *This list is not all possible symptoms. Please call your medical provider for any other symptoms that are severe or concerning to you. Call 911 or call ahead to your local emergency facility: Notify the operator that you are seeking care for someone who has or may have COVID-19. Call ahead before visiting your doctor Call ahead. Many medical visits for routine care are being postponed or done by phone or telemedicine. If you have a medical appointment that  cannot be postponed, call your doctor's office, and tell them you have or may have COVID-19. This will help the office protect themselves and other patients. If you are sick, wear a well-fitting mask You should wear a mask if you must be around other people or animals, including pets (even at home). Wear a mask with the best fit, protection, and comfort for you. You don't need to wear the mask if you are alone. If you can't put on a mask (because of trouble breathing, for example), cover your coughs and sneezes in some other way. Try to stay at least 6 feet away from other people. This will help protect the people around you. Masks should not be placed on young children under age 23 years, anyone who has trouble breathing, or anyone who is not able to remove the mask without help. Cover your coughs and sneezes Cover your mouth and nose with a tissue when you cough or sneeze. Throw away used tissues in a lined trash can. Immediately wash your hands with soap and water for at least 20 seconds. If soap and water are not available, clean your hands with an alcohol-based hand sanitizer that contains at least 60% alcohol. Clean your hands often Wash your hands often with soap and water for at least 20 seconds. This is especially important after blowing your nose, coughing, or sneezing; going to the bathroom; and before eating or preparing food. Use hand sanitizer if soap and water are not available. Use an alcohol-based hand sanitizer with at least 60% alcohol, covering all surfaces of your hands and rubbing them together until they feel dry. Soap and water are the best option, especially if hands are visibly dirty. Avoid touching your eyes, nose, and mouth with unwashed hands. Handwashing Tips Avoid sharing personal household items Do not share dishes, drinking glasses, cups, eating utensils, towels, or bedding with other people in your home. Wash these items thoroughly after using them with soap and  water or put in the dishwasher. Clean surfaces in your home regularly Clean and disinfect high-touch surfaces (for example, doorknobs, tables, handles, light switches, and countertops) in your "sick room" and bathroom. In shared spaces, you should clean and disinfect surfaces and items after each use by the person who is ill. If you are sick and cannot clean, a caregiver or other person should only clean and disinfect the area around you (such as your bedroom and bathroom) on an as needed basis. Your caregiver/other person should wait as long as possible (at least several hours) and wear a mask before entering, cleaning, and disinfecting shared spaces that you use. Clean and disinfect  areas that may have blood, stool, or body fluids on them. Use household cleaners and disinfectants. Clean visible dirty surfaces with household cleaners containing soap or detergent. Then, use a household disinfectant. Use a product from [employer] List N: Disinfectants for Coronavirus (UJWJX-91). Be sure to follow the instructions on the label to ensure safe and effective use of the product. Many products recommend keeping the surface wet with a disinfectant for a certain period of time (look at "contact time" on the product label). You may also need to wear personal protective equipment, such as gloves, depending on the directions on the product label. Immediately after disinfecting, wash your hands with soap and water for 20 seconds. For completed guidance on cleaning and disinfecting your home, visit Complete Disinfection Guidance. Take steps to improve ventilation at home Improve ventilation (air flow) at home to help prevent from spreading COVID-19 to other people in your household. Clear out COVID-19 virus particles in the air by opening windows, using air filters, and turning on fans in your home. Use this interactive tool to learn how to improve air flow in your home. When you can be around others after being sick  with COVID-19 Deciding when you can be around others is different for different situations. Find out when you can safely end home isolation. For any additional questions about your care, contact your healthcare provider or state or local health department. 03/19/2021 Content source: [facility] for Immunization and Respiratory Diseases ([facility]), Division of Viral Diseases This information is not intended to replace advice given to you by your health care provider. Make sure you discuss any questions you have with your health care provider. Document Revised: 05/02/2021 Document Reviewed: 05/02/2021 Elsevier Patient Education  [place],  If you have been instructed to have an in-person evaluation today at a local Urgent Care facility, please use the link below. It will take you to a list of all of our available [facility], including address, phone number and hours of operation. Please do not delay care.  [facility]  If you or a family member do not have a primary care provider, use the link below to schedule a visit and establish care. When you choose a [facility] primary care physician or advanced practice provider, you gain a long-term partner in health. Find a Primary Care Provider  Learn more about 's in-office and virtual care options: [facility]

## 2023-02-05 NOTE — Telephone Encounter (Signed)
Recommend avoiding Paxlovid due to drug interaction with Tikosyn. Would see if his PCP can prescribe molnupiravir instead which avoids drug interaction.

## 2023-02-05 NOTE — Telephone Encounter (Signed)
Patient called to check on status of his call earlier today.

## 2023-02-06 ENCOUNTER — Telehealth: Payer: 59 | Admitting: Family Medicine

## 2023-02-10 ENCOUNTER — Telehealth: Payer: Self-pay | Admitting: Student

## 2023-02-10 NOTE — Telephone Encounter (Signed)
Telephone Encounter:  Pt reports he started feeling like his heart was "out of normal rhythm" for the past 2 hours. Had recent COVID infection. Reports that he has had an irregular heart rate "many years ago" and had to come to the ER to get cardioverted, but had not had any repeat episodes since. He has prn metoprolol 25 mg PO and Xarelto at home. Pt denies presyncope, syncope, CP or SOB. His BP is 137/99 (heart rate 92).   Advised patient that if he is having CP, SOB, syncope or presyncope he should come to the ER for cardioversion. In the absence of symptoms, and with HR currently controled in the 90s, advised patient that he can take metoprolol at home and Xarelto and see his provider in the morning for consideration of cardioversion, long-term rate control agent, and long-term anticoagulation.  Loel Dubonnet MD MPH [facility]

## 2023-02-17 ENCOUNTER — Telehealth: Payer: Self-pay

## 2023-02-17 NOTE — Telephone Encounter (Signed)
Called patient and left message at 8:03 and again at 8:11 letting him know that he will need to call back by 5pm today to reschedule his pre-vist, or his colonoscopy would be cancelled

## 2023-02-17 NOTE — Telephone Encounter (Signed)
Patient returned call and was rescheduled for tomorrow at 9:00

## 2023-02-18 ENCOUNTER — Ambulatory Visit (AMBULATORY_SURGERY_CENTER): Payer: 59

## 2023-02-18 VITALS — Ht 74.0 in | Wt 230.0 lb

## 2023-02-18 DIAGNOSIS — Z86010 Personal history of colonic polyps: Secondary | ICD-10-CM

## 2023-02-18 MED ORDER — NA SULFATE-K SULFATE-MG SULF 17.5-3.13-1.6 GM/177ML PO SOLN: 1.0000 | Freq: Once | ORAL | 0 refills | Status: AC

## 2023-02-18 NOTE — Progress Notes (Signed)
No egg or soy allergy known to patient  No issues known to pt with past sedation with any surgeries or procedures Patient denies ever being told they had issues or difficulty with intubation  No FH of Malignant Hyperthermia Pt is not on diet pills Pt is not on  home 02  Pt is not on blood thinners  Pt denies issues with constipation  HX A fib or A flutter pt states his is in NSR currently  Have any cardiac testing pending--NO  Pt instructed to use Singlecare.com or GoodRx for a price reduction on prep   Patient's chart reviewed by Osvaldo Angst CNRA prior to previsit and patient appropriate for the [place].  Previsit completed and red dot placed by patient's name on their procedure day (on provider's schedule).

## 2023-02-24 ENCOUNTER — Encounter: Payer: Self-pay | Admitting: Internal Medicine

## 2023-03-11 ENCOUNTER — Encounter: Payer: Self-pay | Admitting: Internal Medicine

## 2023-03-11 ENCOUNTER — Ambulatory Visit (AMBULATORY_SURGERY_CENTER): Payer: 59 | Admitting: Internal Medicine

## 2023-03-11 VITALS — BP 111/68 | HR 60 | Temp 96.0°F | Resp 8 | Ht 74.0 in | Wt 230.0 lb

## 2023-03-11 DIAGNOSIS — Z09 Encounter for follow-up examination after completed treatment for conditions other than malignant neoplasm: Secondary | ICD-10-CM

## 2023-03-11 DIAGNOSIS — Z86010 Personal history of colonic polyps: Secondary | ICD-10-CM

## 2023-03-11 MED ORDER — SODIUM CHLORIDE 0.9 % IV SOLN: 500.0000 mL | Freq: Once | INTRAVENOUS | Status: DC

## 2023-03-11 NOTE — Progress Notes (Signed)
HISTORY OF PRESENT ILLNESS:  Johnathan [name] is a 67 y.o. male with a history of multiple adenomatous colon polyps on colonoscopy 2020.  Now for surveillance  REVIEW OF SYSTEMS:  All non-GI ROS negative except for  Past Medical History:  Diagnosis Date   Arthritis    hands and feet   Cutaneous skin tags 01/09/2022   Dysrhythmia    a-fib   Elevated LFTs    Hearing aid worn 2021   right   Hyperlipidemia    Persistent atrial fibrillation (HCC)    RLS (restless legs syndrome)    Sleep apnea    does not use CPAP    Past Surgical History:  Procedure Laterality Date   ATRIAL FIBRILLATION ABLATION  2015   BONE EXCISION Right 05/09/2021   Procedure: RIGHT TIBIA EXCISION;  Surgeon: Hiram Gash, MD;  Location: [facility];  Service: Orthopedics;  Laterality: Right;   COLONOSCOPY     EXCISION MORTON'S NEUROMA Right 05/09/2021   Procedure: EXCISION OF RIGHT SECOND TOE MASS;  Surgeon: Erle Crocker, MD;  Location: [facility];  Service: Orthopedics;  Laterality: Right;   FOOT SURGERY Left    HERNIA REPAIR     LUMBAR [place] SURGERY  2012   L3-L4, No hardware    Social History Ajax Stash  reports that he has never smoked. He has quit using smokeless tobacco.  His smokeless tobacco use included chew. He reports current alcohol use of about 10.0 standard drinks of alcohol per week. He reports that he does not use drugs.  family history includes Diabetes in his brother and mother; Early death in his brother; Heart disease in his brother.  Allergies  Allergen Reactions   Sulfa Antibiotics     Unknown reaction in childhood       PHYSICAL EXAMINATION: Vital signs: BP (!) 141/81   Pulse 81   Temp (!) 96 F (35.6 C)   Ht '6\' 2"'$  (1.88 m)   Wt 230 lb (104.3 kg)   SpO2 98%   BMI 29.53 kg/m  General: Well-developed, well-nourished, no acute distress HEENT: Sclerae are anicteric, conjunctiva pink. Oral mucosa intact Lungs: Clear Heart:  Regular Abdomen: soft, nontender, nondistended, no obvious ascites, no peritoneal signs, normal bowel sounds. No organomegaly. Extremities: No edema Psychiatric: alert and oriented x3. Cooperative     ASSESSMENT:  Personal history of multiple adenomatous colon polyps   PLAN:  Surveillance colonoscopy

## 2023-03-11 NOTE — Op Note (Signed)
[facility] Patient Name: Johnathan [name] Procedure Date: 03/11/2023 9:32 AM MRN: [id number] Endoscopist: Docia Chuck. Henrene Pastor , MD, [id number] Age: 67 Referring MD:  Date of Birth: Sep 02, 1956 Gender: Male Account #: [id number] Procedure:                Colonoscopy Indications:              High risk colon cancer surveillance: Personal                            history of multiple (3 or more) adenomas. Previous                            examination January 2020 Medicines:                Monitored Anesthesia Care Procedure:                Pre-Anesthesia Assessment:                           - Prior to the procedure, a History and Physical                            was performed, and patient medications and                            allergies were reviewed. The patient's tolerance of                            previous anesthesia was also reviewed. The risks                            and benefits of the procedure and the sedation                            options and risks were discussed with the patient.                            All questions were answered, and informed consent                            was obtained. Prior Anticoagulants: The patient has                            taken no anticoagulant or antiplatelet agents. ASA                            Grade Assessment: II - A patient with mild systemic                            disease. After reviewing the risks and benefits,                            the patient was deemed in satisfactory condition to  undergo the procedure.                           After obtaining informed consent, the colonoscope                            was passed under direct vision. Throughout the                            procedure, the patient's blood pressure, pulse, and                            oxygen saturations were monitored continuously. The                            CF [id number] was introduced  through the anus                            and advanced to the the cecum, identified by                            appendiceal orifice and ileocecal valve. The                            ileocecal valve, appendiceal orifice, and rectum                            were photographed. The quality of the bowel                            preparation was excellent. The colonoscopy was                            performed without difficulty. The patient tolerated                            the procedure well. The bowel preparation used was                            SUPREP via split dose instruction. Scope In: 9:42:57 AM Scope Out: 9:52:46 AM Scope Withdrawal Time: 0 hours 8 minutes 18 seconds  Total Procedure Duration: 0 hours 9 minutes 49 seconds  Findings:                 Internal hemorrhoids were found during                            retroflexion. The hemorrhoids were small.                           The exam was otherwise without abnormality on                            direct and retroflexion views. Complications:            No immediate  complications. Estimated blood loss:                            None. Estimated Blood Loss:     Estimated blood loss: none. Impression:               - Internal hemorrhoids.                           - The examination was otherwise normal on direct                            and retroflexion views.                           - No specimens collected. Recommendation:           - Repeat colonoscopy in 5 years for surveillance                            (personal history of multiple adenomatous polyps).                           - Patient has a contact number available for                            emergencies. The signs and symptoms of potential                            delayed complications were discussed with the                            patient. Return to normal activities tomorrow.                            Written discharge instructions were  provided to the                            patient.                           - Resume previous diet.                           - Continue present medications. Docia Chuck. Henrene Pastor, MD 03/11/2023 9:57:31 AM This report has been signed electronically.

## 2023-03-11 NOTE — Patient Instructions (Addendum)
Recommendation: - Repeat colonoscopy in 5 years for surveillance                            (personal history of multiple adenomatous polyps).                           - Patient has a contact number available for                            emergencies. The signs and symptoms of potential                            delayed complications were discussed with the                            patient. Return to normal activities tomorrow.                            Written discharge instructions were provided to the                            patient.                           - Resume previous diet.                           - Continue present medications.  Handout on hemorrhoids given.   YOU HAD AN ENDOSCOPIC PROCEDURE TODAY AT [facility]:   Refer to the procedure report that was given to you for any specific questions about what was found during the examination.  If the procedure report does not answer your questions, please call your gastroenterologist to clarify.  If you requested that your care partner not be given the details of your procedure findings, then the procedure report has been included in a sealed envelope for you to review at your convenience later.  YOU SHOULD EXPECT: Some feelings of bloating in the abdomen. Passage of more gas than usual.  Walking can help get rid of the air that was put into your GI tract during the procedure and reduce the bloating. If you had a lower endoscopy (such as a colonoscopy or flexible sigmoidoscopy) you may notice spotting of blood in your stool or on the toilet paper. If you underwent a bowel prep for your procedure, you may not have a normal bowel movement for a few days.  Please Note:  You might notice some irritation and congestion in your nose or some drainage.  This is from the oxygen used during your procedure.  There is no need for concern and it should clear up in a day or so.  SYMPTOMS TO REPORT IMMEDIATELY:  Following  lower endoscopy (colonoscopy or flexible sigmoidoscopy):  Excessive amounts of blood in the stool  Significant tenderness or worsening of abdominal pains  Swelling of the abdomen that is new, acute  Fever of 100F or higher  For urgent or emergent issues, a gastroenterologist can be reached at any hour by calling [phone]. Do not use MyChart messaging for urgent concerns.   DIET:  We do  recommend a small meal at first, but then you may proceed to your regular diet.  Drink plenty of fluids but you should avoid alcoholic beverages for 24 hours.  ACTIVITY:  You should plan to take it easy for the rest of today and you should NOT DRIVE or use heavy machinery until tomorrow (because of the sedation medicines used during the test).    FOLLOW UP: Our staff will call the number listed on your records the next business day following your procedure.  We will call around 7:15- 8:00 am to check on you and address any questions or concerns that you may have regarding the information given to you following your procedure. If we do not reach you, we will leave a message.     If any biopsies were taken you will be contacted by phone or by letter within the next 1-3 weeks.  Please call us at [phone] if you have not heard about the biopsies in 3 weeks.   SIGNATURES/CONFIDENTIALITY: You and/or your care partner have signed paperwork which will be entered into your electronic medical record.  These signatures attest to the fact that that the information above on your After Visit Summary has been reviewed and is understood.  Full responsibility of the confidentiality of this discharge information lies with you and/or your care-partner.

## 2023-03-11 NOTE — Progress Notes (Signed)
VS by [facility]  Pt's states no medical or surgical changes since previsit or office visit.

## 2023-03-12 ENCOUNTER — Telehealth: Payer: Self-pay | Admitting: *Deleted

## 2023-03-12 NOTE — Telephone Encounter (Signed)
  Follow up Call-     03/11/2023    8:38 AM  Call back number  Post procedure Call Back phone  # [phone]  Permission to leave phone message Yes     Patient questions:  Do you have a fever, pain , or abdominal swelling? No. Pain Score  0 *  Have you tolerated food without any problems? Yes.    Have you been able to return to your normal activities? Yes.    Do you have any questions about your discharge instructions: Diet   No. Medications  No. Follow up visit  No.  Do you have questions or concerns about your Care? No.  Actions: * If pain score is 4 or above: No action needed, pain <4.

## 2023-04-27 ENCOUNTER — Telehealth: Payer: Self-pay | Admitting: Cardiovascular Disease

## 2023-04-27 NOTE — Telephone Encounter (Signed)
Patient aware and verbalized understanding. °

## 2023-04-27 NOTE — Telephone Encounter (Signed)
Patient is on anabiotics calling to see if it would be okay for him to take. It was prescribe by his dentist. Please advise

## 2023-04-27 NOTE — Telephone Encounter (Signed)
Patient states amoxicillin has been given this for dental surgery completed today. Main concern is taking it with Tikosyn  Please advise

## 2023-04-27 NOTE — Telephone Encounter (Signed)
Ok to take amoxicillin, will not interact with his Tikosyn.

## 2023-04-28 ENCOUNTER — Other Ambulatory Visit: Payer: Self-pay | Admitting: Cardiovascular Disease

## 2023-05-08 ENCOUNTER — Other Ambulatory Visit: Payer: Self-pay | Admitting: Cardiovascular Disease

## 2023-05-12 ENCOUNTER — Telehealth: Payer: Self-pay | Admitting: Cardiovascular Disease

## 2023-05-12 NOTE — Telephone Encounter (Signed)
*  STAT* If patient is at the pharmacy, call can be transferred to refill team.   1. Which medications need to be refilled? (please list name of each medication and dose if known)   dofetilide (TIKOSYN) 500 MCG capsule    2. Which pharmacy/location (including street and city if local pharmacy) is medication to be sent to?  [id number] - [place], [place] - [address] AT [address] OF [address]    3. Do they need a 30 day or 90 day supply? 90 day     Pt has scheduled Office Visit for 10/05/2023.

## 2023-05-29 ENCOUNTER — Observation Stay (HOSPITAL_BASED_OUTPATIENT_CLINIC_OR_DEPARTMENT_OTHER)
Admission: EM | Admit: 2023-05-29 | Discharge: 2023-05-30 | Disposition: A | Discharge status: Home / Self Care | Payer: 59 | Attending: Internal Medicine | Admitting: Internal Medicine

## 2023-05-29 ENCOUNTER — Emergency Department (HOSPITAL_BASED_OUTPATIENT_CLINIC_OR_DEPARTMENT_OTHER): Payer: 59

## 2023-05-29 ENCOUNTER — Encounter (HOSPITAL_BASED_OUTPATIENT_CLINIC_OR_DEPARTMENT_OTHER): Payer: Self-pay

## 2023-05-29 ENCOUNTER — Other Ambulatory Visit: Payer: Self-pay

## 2023-05-29 ENCOUNTER — Telehealth: Payer: 59 | Admitting: Physician Assistant

## 2023-05-29 DIAGNOSIS — E878 Other disorders of electrolyte and fluid balance, not elsewhere classified: Secondary | ICD-10-CM | POA: Insufficient documentation

## 2023-05-29 DIAGNOSIS — D72829 Elevated white blood cell count, unspecified: Secondary | ICD-10-CM | POA: Insufficient documentation

## 2023-05-29 DIAGNOSIS — I10 Essential (primary) hypertension: Secondary | ICD-10-CM | POA: Insufficient documentation

## 2023-05-29 DIAGNOSIS — E785 Hyperlipidemia, unspecified: Secondary | ICD-10-CM | POA: Diagnosis present

## 2023-05-29 DIAGNOSIS — J384 Edema of larynx: Principal | ICD-10-CM | POA: Insufficient documentation

## 2023-05-29 DIAGNOSIS — R499 Unspecified voice and resonance disorder: Secondary | ICD-10-CM

## 2023-05-29 DIAGNOSIS — J029 Acute pharyngitis, unspecified: Secondary | ICD-10-CM | POA: Diagnosis not present

## 2023-05-29 DIAGNOSIS — Z79899 Other long term (current) drug therapy: Secondary | ICD-10-CM | POA: Insufficient documentation

## 2023-05-29 DIAGNOSIS — R070 Pain in throat: Secondary | ICD-10-CM | POA: Diagnosis present

## 2023-05-29 DIAGNOSIS — E860 Dehydration: Secondary | ICD-10-CM | POA: Diagnosis not present

## 2023-05-29 DIAGNOSIS — I4821 Permanent atrial fibrillation: Secondary | ICD-10-CM | POA: Diagnosis not present

## 2023-05-29 DIAGNOSIS — G4733 Obstructive sleep apnea (adult) (pediatric): Secondary | ICD-10-CM

## 2023-05-29 DIAGNOSIS — J36 Peritonsillar abscess: Secondary | ICD-10-CM

## 2023-05-29 DIAGNOSIS — J392 Other diseases of pharynx: Secondary | ICD-10-CM

## 2023-05-29 DIAGNOSIS — Z87891 Personal history of nicotine dependence: Secondary | ICD-10-CM | POA: Insufficient documentation

## 2023-05-29 DIAGNOSIS — E871 Hypo-osmolality and hyponatremia: Secondary | ICD-10-CM | POA: Diagnosis not present

## 2023-05-29 LAB — CBC WITH DIFFERENTIAL/PLATELET
Abs Immature Granulocytes: 0.1 10*3/uL — ABNORMAL HIGH (ref 0.00–0.07)
Basophils Absolute: 0 10*3/uL (ref 0.0–0.1)
Basophils Relative: 0 %
Eosinophils Absolute: 0 10*3/uL (ref 0.0–0.5)
Eosinophils Relative: 0 %
HCT: 44.9 % (ref 39.0–52.0)
Hemoglobin: 15.7 g/dL (ref 13.0–17.0)
Immature Granulocytes: 1 %
Lymphocytes Relative: 4 %
Lymphs Abs: 0.8 10*3/uL (ref 0.7–4.0)
MCH: 30.4 pg (ref 26.0–34.0)
MCHC: 35 g/dL (ref 30.0–36.0)
MCV: 87 fL (ref 80.0–100.0)
Monocytes Absolute: 1 10*3/uL (ref 0.1–1.0)
Monocytes Relative: 5 %
Neutro Abs: 19.5 10*3/uL — ABNORMAL HIGH (ref 1.7–7.7)
Neutrophils Relative %: 90 %
Platelets: 270 10*3/uL (ref 150–400)
RBC: 5.16 MIL/uL (ref 4.22–5.81)
RDW: 13.1 % (ref 11.5–15.5)
WBC: 21.4 10*3/uL — ABNORMAL HIGH (ref 4.0–10.5)
nRBC: 0 % (ref 0.0–0.2)

## 2023-05-29 LAB — BASIC METABOLIC PANEL
Anion gap: 12 (ref 5–15)
BUN: 16 mg/dL (ref 8–23)
CO2: 22 mmol/L (ref 22–32)
Calcium: 9 mg/dL (ref 8.9–10.3)
Chloride: 94 mmol/L — ABNORMAL LOW (ref 98–111)
Creatinine, Ser: 1.01 mg/dL (ref 0.61–1.24)
GFR, Estimated: >60 mL/min (ref >60)
Glucose, Bld: 131 mg/dL — ABNORMAL HIGH (ref 70–99)
Potassium: 3.8 mmol/L (ref 3.5–5.1)
Sodium: 128 mmol/L — ABNORMAL LOW (ref 135–145)

## 2023-05-29 LAB — LACTIC ACID, PLASMA
Lactic Acid, Venous: 1.4 mmol/L (ref 0.5–1.9)
Lactic Acid, Venous: 1.4 mmol/L (ref 0.5–1.9)

## 2023-05-29 MED ORDER — SODIUM CHLORIDE 0.9 % IV SOLN: INTRAVENOUS | Status: DC

## 2023-05-29 MED ORDER — METOPROLOL TARTRATE 25 MG PO TABS
25.0000 mg | ORAL_TABLET | Freq: Two times a day (BID) | ORAL | Status: DC | PRN
  Filled 2023-05-29: qty 1

## 2023-05-29 MED ORDER — TAMSULOSIN HCL 0.4 MG PO CAPS
0.4000 mg | ORAL_CAPSULE | Freq: Every day | ORAL | Status: DC
  Filled 2023-05-29: qty 1

## 2023-05-29 MED ORDER — ESCITALOPRAM OXALATE 10 MG PO TABS
5.0000 mg | ORAL_TABLET | Freq: Every day | ORAL | Status: DC
  Administered 2023-05-30: 5 mg via ORAL
  Filled 2023-05-29: qty 1

## 2023-05-29 MED ORDER — METOPROLOL TARTRATE 25 MG PO TABS: 25.0000 mg | ORAL_TABLET | Freq: Once | ORAL | Status: DC

## 2023-05-29 MED ORDER — METOPROLOL TARTRATE 5 MG/5ML IV SOLN
5.0000 mg | Freq: Once | INTRAVENOUS | Status: AC
  Administered 2023-05-29: 5 mg via INTRAVENOUS
  Filled 2023-05-29: qty 5

## 2023-05-29 MED ORDER — POTASSIUM CHLORIDE CRYS ER 20 MEQ PO TBCR
20.0000 meq | EXTENDED_RELEASE_TABLET | ORAL | Status: AC
  Administered 2023-05-30 (×2): 20 meq via ORAL
  Filled 2023-05-29 (×2): qty 1

## 2023-05-29 MED ORDER — FENTANYL CITRATE PF 50 MCG/ML IJ SOSY
50.0000 ug | PREFILLED_SYRINGE | Freq: Once | INTRAMUSCULAR | Status: AC
  Administered 2023-05-29: 50 ug via INTRAVENOUS
  Filled 2023-05-29: qty 1

## 2023-05-29 MED ORDER — MORPHINE SULFATE (PF) 4 MG/ML IV SOLN
4.0000 mg | Freq: Once | INTRAVENOUS | Status: AC
  Administered 2023-05-29: 4 mg via INTRAVENOUS
  Filled 2023-05-29: qty 1

## 2023-05-29 MED ORDER — MORPHINE SULFATE (PF) 2 MG/ML IV SOLN
2.0000 mg | INTRAVENOUS | Status: DC | PRN
  Administered 2023-05-30: 2 mg via INTRAVENOUS
  Filled 2023-05-29 (×2): qty 1

## 2023-05-29 MED ORDER — IOHEXOL 300 MG/ML  SOLN
75.0000 mL | Freq: Once | INTRAMUSCULAR | Status: AC | PRN
  Administered 2023-05-29: 75 mL via INTRAVENOUS

## 2023-05-29 MED ORDER — DOFETILIDE 250 MCG PO CAPS
500.0000 ug | ORAL_CAPSULE | Freq: Two times a day (BID) | ORAL | Status: DC
  Administered 2023-05-30 (×2): 500 ug via ORAL
  Filled 2023-05-29 (×2): qty 2

## 2023-05-29 MED ORDER — DEXAMETHASONE SODIUM PHOSPHATE 10 MG/ML IJ SOLN
10.0000 mg | Freq: Three times a day (TID) | INTRAMUSCULAR | Status: DC
  Administered 2023-05-30: 10 mg via INTRAVENOUS
  Filled 2023-05-29: qty 1

## 2023-05-29 MED ORDER — ACETAMINOPHEN 650 MG RE SUPP: 650.0000 mg | Freq: Four times a day (QID) | RECTAL | Status: DC | PRN

## 2023-05-29 MED ORDER — SODIUM CHLORIDE 0.9 % IV SOLN
3.0000 g | Freq: Four times a day (QID) | INTRAVENOUS | Status: DC
  Administered 2023-05-30 (×2): 3 g via INTRAVENOUS
  Filled 2023-05-29: qty 8

## 2023-05-29 MED ORDER — SODIUM CHLORIDE 0.9 % IV BOLUS
500.0000 mL | Freq: Once | INTRAVENOUS | Status: AC
  Administered 2023-05-29: 500 mL via INTRAVENOUS

## 2023-05-29 MED ORDER — ATORVASTATIN CALCIUM 40 MG PO TABS
40.0000 mg | ORAL_TABLET | Freq: Every day | ORAL | Status: DC
  Administered 2023-05-30: 40 mg via ORAL
  Filled 2023-05-29: qty 1

## 2023-05-29 MED ORDER — DEXAMETHASONE SODIUM PHOSPHATE 10 MG/ML IJ SOLN
10.0000 mg | Freq: Once | INTRAMUSCULAR | Status: AC
  Administered 2023-05-29: 10 mg via INTRAVENOUS
  Filled 2023-05-29: qty 1

## 2023-05-29 MED ORDER — SODIUM CHLORIDE 0.9 % IV BOLUS
1000.0000 mL | Freq: Once | INTRAVENOUS | Status: AC
  Administered 2023-05-29: 1000 mL via INTRAVENOUS

## 2023-05-29 MED ORDER — SODIUM CHLORIDE 0.9 % IV SOLN
3.0000 g | Freq: Once | INTRAVENOUS | Status: AC
  Administered 2023-05-29: 3 g via INTRAVENOUS

## 2023-05-29 MED ORDER — ACETAMINOPHEN 325 MG PO TABS
650.0000 mg | ORAL_TABLET | Freq: Four times a day (QID) | ORAL | Status: DC | PRN
  Administered 2023-05-30: 650 mg via ORAL
  Filled 2023-05-29: qty 2

## 2023-05-29 MED ORDER — ENOXAPARIN SODIUM 40 MG/0.4ML IJ SOSY: 40.0000 mg | PREFILLED_SYRINGE | Freq: Every day | INTRAMUSCULAR | Status: DC

## 2023-05-29 MED ORDER — ALLOPURINOL 100 MG PO TABS
200.0000 mg | ORAL_TABLET | Freq: Every day | ORAL | Status: DC
  Administered 2023-05-30: 200 mg via ORAL
  Filled 2023-05-29: qty 2

## 2023-05-29 NOTE — Progress Notes (Signed)
Virtual Visit Consent   Johnathan [name], you are scheduled for a virtual visit with a [facility] provider today. Just as with appointments in the office, your consent must be obtained to participate. Your consent will be active for this visit and any virtual visit you may have with one of our providers in the next 365 days. If you have a MyChart account, a copy of this consent can be sent to you electronically.  As this is a virtual visit, video technology does not allow for your provider to perform a traditional examination. This may limit your provider's ability to fully assess your condition. If your provider identifies any concerns that need to be evaluated in person or the need to arrange testing (such as labs, EKG, etc.), we will make arrangements to do so. Although advances in technology are sophisticated, we cannot ensure that it will always work on either your end or our end. If the connection with a video visit is poor, the visit may have to be switched to a telephone visit. With either a video or telephone visit, we are not always able to ensure that we have a secure connection.  By engaging in this virtual visit, you consent to the provision of healthcare and authorize for your insurance to be billed (if applicable) for the services provided during this visit. Depending on your insurance coverage, you may receive a charge related to this service.  I need to obtain your verbal consent now. Are you willing to proceed with your visit today? Johnathan [name] has provided verbal consent on 05/29/2023 for a virtual visit (video or telephone). Margaretann Loveless, PA-C  Date: 05/29/2023 5:14 PM  Virtual Visit via Video Note   I, Margaretann Loveless, connected with  Johnathan [name]  ([id number], 1956-02-26) on 05/29/23 at  5:15 PM EDT by a video-enabled telemedicine application and verified that I am speaking with the correct person using two identifiers.  Location: Patient: Virtual Visit Location Patient:  Home Provider: Virtual Visit Location Provider: Home Office   I discussed the limitations of evaluation and management by telemedicine and the availability of in person appointments. The patient expressed understanding and agreed to proceed.    History of Present Illness: Johnathan [name] is a 67 y.o. who identifies as a male who was assigned male at birth, and is being seen today for sore throat and swelling.  HPI: Sore Throat  This is a new problem. The current episode started today. The problem has been rapidly worsening. The pain is worse on the left side. Maximum temperature: subjective fevers since seeing provider. The fever has been present for Less than 1 day. The pain is moderate. Associated symptoms include headaches, neck pain, swollen glands and trouble swallowing. Pertinent negatives include no congestion, coughing, ear discharge, ear pain, hoarse voice, plugged ear sensation, shortness of breath or stridor. Associated symptoms comments: Malaise and chills since being seen earlier today, throat swelling is progressing. Left side is worse and starting to make him gag. Having trouble swallowing pills now. He has tried acetaminophen and NSAIDs for the symptoms. The treatment provided no relief.  Had rapid strep test and covid testing in office that were negative. Throat culture pending.   Problems:  Patient Active Problem List   Diagnosis Date Noted   [place] disease 10/20/2019   Tinnitus aurium, right 03/29/2019   BPH associated with nocturia 12/20/2018   OSA on CPAP 03/22/2018   Primary osteoarthritis of first carpometacarpal joint of right hand 05/07/2017  Permanent atrial fibrillation (HCC) 04/15/2017   Chronic pain of right thumb 04/15/2017   Anxiety 04/15/2017   Long-term use of high-risk medication 04/15/2017   Hyperlipidemia    Atypical atrial flutter (HCC) 09/10/2015    Allergies:  Allergies  Allergen Reactions   Sulfa Antibiotics     Unknown reaction in childhood    Medications:  Current Outpatient Medications:    allopurinol (ZYLOPRIM) 100 MG tablet, Take 2 tablets (200 mg total) by mouth daily., Disp: 180 tablet, Rfl: 3   atorvastatin (LIPITOR) 40 MG tablet, Take 1 tablet (40 mg total) by mouth daily., Disp: 90 tablet, Rfl: 3   colchicine 0.6 MG tablet, 1 tab po with onset of gout, repeat 1 tab po in 3 hours.Then 1 tab po daily until resolved., Disp: 30 tablet, Rfl: 11   dofetilide (TIKOSYN) 500 MCG capsule, Take 1 capsule (500 mcg total) by mouth 2 (two) times daily. NEED OV., Disp: 180 capsule, Rfl: 0   escitalopram (LEXAPRO) 5 MG tablet, Take 1 tablet (5 mg total) by mouth daily., Disp: 90 tablet, Rfl: 3   metoprolol tartrate (LOPRESSOR) 25 MG tablet, Take 1 tablet (25 mg total) by mouth 2 (two) times daily as needed (HR >110)., Disp: 60 tablet, Rfl: 2   naproxen (NAPROSYN) 500 MG tablet, Take 1 tablet (500 mg total) by mouth 2 (two) times daily with a meal. (Patient not taking: Reported on 02/18/2023), Disp: 30 tablet, Rfl: 0   tamsulosin (FLOMAX) 0.4 MG CAPS capsule, Take 1 capsule (0.4 mg total) by mouth daily., Disp: 90 capsule, Rfl: 3  Observations/Objective: Patient is well-developed, well-nourished in no acute distress.  Resting comfortably at home.  Head is normocephalic, atraumatic.  No labored breathing.  Speech is clear and coherent with logical content.  Patient is alert and oriented at baseline.    Assessment and Plan: 1. Tonsillar abscess  - Rapidly worsening with swelling of left tonsil growing rapidly associated with worsening fevers, chills, malaise and difficulty swallowing.  - Since progressive, concerning symptoms for a tonsillar abscess - Advised in person evaluation urgently for evaluation  Follow Up Instructions: I discussed the assessment and treatment plan with the patient. The patient was provided an opportunity to ask questions and all were answered. The patient agreed with the plan and demonstrated an understanding  of the instructions.  A copy of instructions were sent to the patient via MyChart unless otherwise noted below.    The patient was advised to call back or seek an in-person evaluation if the symptoms worsen or if the condition fails to improve as anticipated.  Time:  I spent 10 minutes with the patient via telehealth technology discussing the above problems/concerns.    Margaretann Loveless, PA-C

## 2023-05-29 NOTE — ED Notes (Signed)
Provider at bedside

## 2023-05-29 NOTE — ED Notes (Signed)
[facility] called for transport @21 :33

## 2023-05-29 NOTE — ED Provider Notes (Signed)
[facility] Provider Note   CSN: [id number] Arrival date & time: 05/29/23  1752     History  Chief Complaint  Patient presents with   Sore Throat   Atrial Fibrillation    Johnathan [name] is a 67 y.o. male.  The history is provided by the patient and medical records. No language interpreter was used.  Sore Throat This is a new problem. The current episode started 12 to 24 hours ago. The problem occurs constantly. The problem has been gradually worsening. Associated symptoms include shortness of breath (positionally). Pertinent negatives include no chest pain, no abdominal pain and no headaches. The symptoms are aggravated by swallowing. Nothing relieves the symptoms. The treatment provided no relief.  Atrial Fibrillation Associated symptoms include shortness of breath (positionally). Pertinent negatives include no chest pain, no abdominal pain and no headaches.       Home Medications Prior to Admission medications   Medication Sig Start Date End Date Taking? Authorizing Provider  allopurinol (ZYLOPRIM) 100 MG tablet Take 2 tablets (200 mg total) by mouth daily. 01/19/23   Kuneff, Renee A, DO  atorvastatin (LIPITOR) 40 MG tablet Take 1 tablet (40 mg total) by mouth daily. 01/19/23   Kuneff, Renee A, DO  colchicine 0.6 MG tablet 1 tab po with onset of gout, repeat 1 tab po in 3 hours.Then 1 tab po daily until resolved. 01/19/23   Kuneff, Renee A, DO  dofetilide (TIKOSYN) 500 MCG capsule Take 1 capsule (500 mcg total) by mouth 2 (two) times daily. NEED OV. 05/08/23   Runell Gess, MD  escitalopram (LEXAPRO) 5 MG tablet Take 1 tablet (5 mg total) by mouth daily. 01/19/23   Kuneff, Renee A, DO  metoprolol tartrate (LOPRESSOR) 25 MG tablet Take 1 tablet (25 mg total) by mouth 2 (two) times daily as needed (HR >110). 11/15/22 02/18/23  Linton Rump, MD  naproxen (NAPROSYN) 500 MG tablet Take 1 tablet (500 mg total) by mouth 2 (two) times  daily with a meal. Patient not taking: Reported on 02/18/2023 01/28/23   Felix Pacini A, DO  tamsulosin (FLOMAX) 0.4 MG CAPS capsule Take 1 capsule (0.4 mg total) by mouth daily. 01/19/23   Kuneff, Renee A, DO      Allergies    Sulfa antibiotics    Review of Systems   Review of Systems  Constitutional:  Negative for chills, fatigue and fever.  HENT:  Positive for sore throat, trouble swallowing and voice change. Negative for congestion.   Eyes:  Negative for photophobia and visual disturbance.  Respiratory:  Positive for shortness of breath (positionally). Negative for cough and chest tightness.   Cardiovascular:  Negative for chest pain.  Gastrointestinal:  Negative for abdominal pain, constipation, diarrhea, nausea and vomiting.  Genitourinary:  Negative for dysuria and flank pain.  Musculoskeletal:  Positive for neck pain. Negative for back pain and neck stiffness.  Skin:  Negative for rash and wound.  Neurological:  Negative for light-headedness and headaches.  Psychiatric/Behavioral:  Negative for agitation.   All other systems reviewed and are negative.   Physical Exam Updated Vital Signs BP (!) 153/83   Pulse 83   Temp 99.8 F (37.7 C) (Oral)   Resp 17   Ht 6\' 2"  (1.88 m)   Wt 104.3 kg   SpO2 96%   BMI 29.53 kg/m  Physical Exam Vitals and nursing note reviewed.  Constitutional:      General: He is not in acute distress.  Appearance: He is well-developed. He is not ill-appearing, toxic-appearing or diaphoretic.  HENT:     Head: Normocephalic and atraumatic.     Mouth/Throat:     Mouth: Mucous membranes are moist.     Pharynx: Pharyngeal swelling, posterior oropharyngeal erythema and uvula swelling present.     Comments: Uvula deviated towards the right.  Left tonsillar and posterior swelling.  There is also uvula edema.  No stridor. Eyes:     Conjunctiva/sclera: Conjunctivae normal.  Cardiovascular:     Rate and Rhythm: Tachycardia present. Rhythm irregular.      Heart sounds: No murmur heard. Pulmonary:     Effort: Pulmonary effort is normal. No respiratory distress.     Breath sounds: Normal breath sounds. No stridor. No wheezing, rhonchi or rales.  Chest:     Chest wall: No tenderness.  Abdominal:     Palpations: Abdomen is soft.     Tenderness: There is no abdominal tenderness.  Musculoskeletal:        General: No swelling.     Cervical back: Neck supple.  Skin:    General: Skin is warm and dry.     Capillary Refill: Capillary refill takes less than 2 seconds.  Neurological:     Mental Status: He is alert.  Psychiatric:        Mood and Affect: Mood normal.     ED Results / Procedures / Treatments   Labs (all labs ordered are listed, but only abnormal results are displayed) Labs Reviewed  CBC WITH DIFFERENTIAL/PLATELET - Abnormal; Notable for the following components:      Result Value   WBC 21.4 (*)    Neutro Abs 19.5 (*)    Abs Immature Granulocytes 0.10 (*)    All other components within normal limits  BASIC METABOLIC PANEL - Abnormal; Notable for the following components:   Sodium 128 (*)    Chloride 94 (*)    Glucose, Bld 131 (*)    All other components within normal limits  CULTURE, BLOOD (ROUTINE X 2)  CULTURE, BLOOD (ROUTINE X 2)  LACTIC ACID, PLASMA  LACTIC ACID, PLASMA    EKG EKG Interpretation  Date/Time:  Friday May 29 2023 18:03:41 EDT Ventricular Rate:  96 PR Interval:  142 QRS Duration: 96 QT Interval:  346 QTC Calculation: 438 R Axis:   -60 Text Interpretation: Sinus rhythm Left anterior fascicular block Abnormal R-wave progression, late transition Minimal ST depression, lateral leads when compared to prior, slightly faster rate. No STEMI Confirmed by Theda Belfast ([id number]) on 05/29/2023 6:13:16 PM  Radiology CT Soft Tissue Neck W Contrast  Result Date: 05/29/2023 CLINICAL DATA:  Sore throat with difficulty swallowing. EXAM: CT NECK WITH CONTRAST TECHNIQUE: Multidetector CT imaging of the neck was  performed using the standard protocol following the bolus administration of intravenous contrast. RADIATION DOSE REDUCTION: This exam was performed according to the departmental dose-optimization program which includes automated exposure control, adjustment of the mA and/or kV according to patient size and/or use of iterative reconstruction technique. CONTRAST:  75mL OMNIPAQUE IOHEXOL 300 MG/ML  SOLN COMPARISON:  07/28/2019 FINDINGS: Pharynx and larynx: Limited by motion artifact. Submucosal edema in the left pharynx and supraglottic larynx extending from the tonsillar fossa and uvula to the left aryepiglottic fold. The airway is patent with no collection. Bilateral palatine tonsilliths. Salivary glands: Unremarkable Thyroid: Normal Lymph nodes: None enlarged or heterogeneous. Vascular: Negative Limited intracranial: Negative Visualized orbits: Unremarkable Mastoids and visualized paranasal sinuses: Clear Skeleton: Unremarkable Upper chest: Clear apical  lungs IMPRESSION: Asymmetric left pharyngeal and supraglottic laryngeal edema without abscess. Electronically Signed   By: Tiburcio Pea M.D.   On: 05/29/2023 19:48    Procedures Procedures    Medications Ordered in ED Medications  Ampicillin-Sulbactam (UNASYN) 3 g in sodium chloride 0.9 % 100 mL IVPB (has no administration in time range)  dexamethasone (DECADRON) injection 10 mg (10 mg Intravenous Given 05/29/23 1843)  sodium chloride 0.9 % bolus 500 mL ( Intravenous Stopped 05/29/23 1841)  morphine (PF) 4 MG/ML injection 4 mg (4 mg Intravenous Given 05/29/23 1849)  iohexol (OMNIPAQUE) 300 MG/ML solution 75 mL (75 mLs Intravenous Contrast Given 05/29/23 1922)  fentaNYL (SUBLIMAZE) injection 50 mcg (50 mcg Intravenous Given 05/29/23 2002)  sodium chloride 0.9 % bolus 1,000 mL (1,000 mLs Intravenous New Bag/Given 05/29/23 2009)  metoprolol tartrate (LOPRESSOR) injection 5 mg (5 mg Intravenous Given 05/29/23 2002)    ED Course/ Medical Decision Making/  A&P                             Medical Decision Making Amount and/or Complexity of Data Reviewed Labs: ordered.  Risk Prescription drug management. Decision regarding hospitalization.    Johnathan [name] is a 67 y.o. male with a past medical history significant for permanent atrial fibrillation without continuous anticoagulation, hyperlipidemia, and restless leg syndrome who presents with sore throat and neck pain.  According to patient, he was completely normal last night going to bed.  He said that since this morning he developed sore throat and then progressively worsening.  He reports now depending on how he is sitting he has difficulty breathing.  He reports that he has difficulty swallowing and 10 out of 10 pain when he tries to swallow.  He denies history of strep throat or previous deep space neck infections.  He denies history of peritonsillar abscess or retropharyngeal abscess.  Denies history of esophageal dilation or other procedures like that.  He denies any fevers or chills at this time and also denies chest pain.  He reports he went to an urgent care and was told he was negative for strep and COVID.  He then did a virtual visit and was told he needed to come to the emergency department for evaluation given his difficulty swallowing and breathing and voice change.  He otherwise denies any nausea, vomiting, constipation, diarrhea, or urinary changes.  On exam, lungs clear.  Chest nontender.  I do not appreciate stridor.  Patient has a very impressive uvular deviation and swelling on his left posterior oropharynx and tonsils.  The uvula also appears edematous.  There is some erythema and yellow coloration to it.  Rest of exam unremarkable.  He was able to move his neck without significant discomfort but he was tender on his left neck anteriorly.  Clinically I am concerned about peritonsillar abscess or significant infection in the neck.  CT was obtained and showed edema  asymmetrically but no large abscess at this time.  He does have a leukocytosis of 21.  Will add on blood cultures and lactic acid.  He is already to steroids we will give some fluids as he is in A-fib with rates going into the 1 10-1 20 range.  Will have him hold his Xarelto he has with him that he occasionally takes when he goes into A-fib.  He has not taken it recently and we told him to hold in case he does develop an abscess needing surgery.  I called and spoke with Dr. Delfin Edis with ENT who recommended Unasyn and admission to [facility] for monitoring.  He would see the patient tomorrow.  Due to the patient's difficulty breathing positionally and his rapid progression throughout the day, I do not feel he is safe for discharge home.  Will call medicine for admission to [facility] for further management of pharyngitis.  8:12 PM Patient reports he normally takes metoprolol when he goes in A-fib and I gave him a dose here.  He now appears to be in sinus rhythm on the monitor.     Will admit for further management of supraglottic and parapharyngeal/laryngeal edema and infection.           Final Clinical Impression(s) / ED Diagnoses Final diagnoses:  Acute pharyngitis, unspecified etiology  Pharyngeal edema  Supraglottic edema  Change in voice    Clinical Impression: 1. Acute pharyngitis, unspecified etiology   2. Pharyngeal edema   3. Supraglottic edema   4. Change in voice     Disposition: Admit  This note was prepared with assistance of Dragon voice recognition software. Occasional wrong-word or sound-a-like substitutions may have occurred due to the inherent limitations of voice recognition software.     Trenae Brunke, Canary Brim, MD 05/29/23 2015

## 2023-05-29 NOTE — Patient Instructions (Signed)
Johnathan [name], thank you for joining Margaretann Loveless, PA-C for today's virtual visit.  While this provider is not your primary care provider (PCP), if your PCP is located in our provider database this encounter information will be shared with them immediately following your visit.   A [facility] MyChart account gives you access to today's visit and all your visits, tests, and labs performed at [facility] " click here if you don't have a [facility] MyChart account or go to [email or web address]  Consent: (Patient) Johnathan [name] provided verbal consent for this virtual visit at the beginning of the encounter.  Current Medications:  Current Outpatient Medications:    allopurinol (ZYLOPRIM) 100 MG tablet, Take 2 tablets (200 mg total) by mouth daily., Disp: 180 tablet, Rfl: 3   atorvastatin (LIPITOR) 40 MG tablet, Take 1 tablet (40 mg total) by mouth daily., Disp: 90 tablet, Rfl: 3   colchicine 0.6 MG tablet, 1 tab po with onset of gout, repeat 1 tab po in 3 hours.Then 1 tab po daily until resolved., Disp: 30 tablet, Rfl: 11   dofetilide (TIKOSYN) 500 MCG capsule, Take 1 capsule (500 mcg total) by mouth 2 (two) times daily. NEED OV., Disp: 180 capsule, Rfl: 0   escitalopram (LEXAPRO) 5 MG tablet, Take 1 tablet (5 mg total) by mouth daily., Disp: 90 tablet, Rfl: 3   metoprolol tartrate (LOPRESSOR) 25 MG tablet, Take 1 tablet (25 mg total) by mouth 2 (two) times daily as needed (HR >110)., Disp: 60 tablet, Rfl: 2   naproxen (NAPROSYN) 500 MG tablet, Take 1 tablet (500 mg total) by mouth 2 (two) times daily with a meal. (Patient not taking: Reported on 02/18/2023), Disp: 30 tablet, Rfl: 0   tamsulosin (FLOMAX) 0.4 MG CAPS capsule, Take 1 capsule (0.4 mg total) by mouth daily., Disp: 90 capsule, Rfl: 3   Medications ordered in this encounter:  No orders of the defined types were placed in this encounter.    *If you need refills on other medications prior to your next appointment,  please contact your pharmacy*  Follow-Up: Call back or seek an in-person evaluation if the symptoms worsen or if the condition fails to improve as anticipated.  [facility] [phone]  Other Instructions Peritonsillar Cellulitis  Peritonsillar cellulitis is an infection of the tissue around a tonsil that results in a severe sore throat. If it is not treated, a collection of pus can form in the throat (peritonsillar abscess), which may require drainage. What are the causes? This condition is usually caused by a combination of several types of bacteria. You may get infected by: Breathing in droplets from an infected person's cough or sneeze. Touching something that has been exposed to the bacteria (has been contaminated) and then touching your mouth, nose, or eyes. What increases the risk? The following factors may make you more likely to develop this condition: Having frequent tonsil infections. Having gum disease or a dental infection. Smoking. What are the signs or symptoms? Early symptoms of this condition include: Fever and chills. Soreness on one side of the throat. Pain in one ear. Pain when swallowing. Tiredness. As the infection gets worse, symptoms may include: Severe pain when swallowing. Drooling. Trouble opening the mouth wide. Bad breath. Changes in how the voice sounds, such as hoarseness. How is this diagnosed? This condition may be diagnosed based on: Your symptoms. A physical exam. Testing a fluid sample from your throat (throat culture). This helps determine which types of bacteria are  causing your infection. A blood test. How is this treated? This condition is usually treated with antibiotic medicines. You may need to take these medicines by mouth or through an IV at the hospital. Treatment may also include: Medicines for pain, fever, or swelling. Some of these medicines may be given through an IV. Draining a peritonsillar abscess, if you  have one. Your health care provider may drain the abscess by using a needle or by making an incision in the abscess. Surgery to remove the tonsils (tonsillectomy). This may be done if you get peritonsillar cellulitis often. Follow these instructions at home: Medicines If you were prescribed an antibiotic, take it as told by your health care provider. Do not stop taking the antibiotic even if you start to feel better. Take over-the-counter and prescription medicines only as told by your health care provider. Eating and drinking     Drink enough fluid to keep your urine pale yellow. If it is difficult or painful to swallow, try only drinking liquids or only eating soft foods until you feel better. Activity Rest as told by your health care provider. Return to your normal activities as told by your health care provider. Ask your health care provider what activities are safe for you. General instructions Do not use any products that contain nicotine or tobacco. These products include cigarettes, chewing tobacco, and vaping devices, such as e-cigarettes. If you need help quitting, ask your health care provider. If your abscess was drained, gargle with a mixture of salt and water 3-4 times a day or as needed. To make salt water, completely dissolve -1 tsp (3-6 g) of salt in 1 cup (237 mL) of warm water. Do not swallow this mixture. Keep all follow-up visits. This is important. Contact a health care provider if: You have pain or swelling that gets worse. You develop difficulty swallowing. You have a fever that does not improve after you take medicine. Your voice changes. You see pus around or near your tonsils. This may look like yellowish-white fluid. Get help right away if: You have trouble breathing. You have severe pain. You cough up bloody spit. You are unable to swallow. You cannot stop drooling. These symptoms may represent a serious problem that is an emergency. Do not wait to see  if the symptoms will go away. Get medical help right away. Call your local emergency services (911 in the [place] Do not drive yourself to the hospital. Summary Peritonsillar cellulitis is an infection of the tissue around a tonsil that results in a severe sore throat. If the condition is not treated, a collection of pus can form in the throat (peritonsillar abscess), which may need to be drained. This condition is usually treated with antibiotic medicines. You may need to take these medicines by mouth or through an IV at the hospital. Rest as told by your health care provider. Return to your normal activities as told by your health care provider. This information is not intended to replace advice given to you by your health care provider. Make sure you discuss any questions you have with your health care provider. Document Revised: 04/26/2021 Document Reviewed: 04/26/2021 Elsevier Patient Education  2024 Elsevier Inc.    If you have been instructed to have an in-person evaluation today at a local Urgent Care facility, please use the link below. It will take you to a list of all of our available [facility], including address, phone number and hours of operation. Please do not delay  care.  [facility]  If you or a family member do not have a primary care provider, use the link below to schedule a visit and establish care. When you choose a [facility] primary care physician or advanced practice provider, you gain a long-term partner in health. Find a Primary Care Provider  Learn more about [facility]'s in-office and virtual care options: [facility] - Get Care Now

## 2023-05-29 NOTE — Progress Notes (Signed)
This is a 67 year old male with past medical history of OSA on CPAP, anxiety, HLD, A-flutter, BPH and Gilbert's disease.  He presents to the ED with complaint of sore throat that he noted on awakening this morning.  The discomfort has been constant and gradually worsening.  By this afternoon he developed discomfort with swallowing that he ranked as 10/10.  He also has developed difficulty with breathing.  He decided to go to [facility] ER.  In the ER patient noted to have an enlarged erythematous uvula, WBC 21.4, sodium 128, and a very low-grade temp of 99.8.  CT soft tissue neck with contrast revealed asymmetric left pharyngeal and supraglottic laryngeal edema without abscess.  ENT Dr. Wonda Cerise was consulted by EDP.  Recommendation Decadron 10 mg IV every 8 x 3 and Unasyn.  He will see the patient in the morning.  Patient accepted in transfer to [facility]

## 2023-05-29 NOTE — ED Triage Notes (Signed)
Pt seen by UC today for sorethroat that started today. Noted to have abscessed like area to left side of throat. Pt reports difficulty with drinking and swallowing. Pt reports on the way to ED he went into Afib. Denies CP

## 2023-05-29 NOTE — H&P (Signed)
History and Physical    Johnathan [name]:[id number] DOB: May 19, 1956 DOA: 05/29/2023  PCP: Johnathan Leatherwood, DO  Patient coming from: Home  I have personally briefly reviewed patient's old medical records available.   Chief Complaint: Throat pain and swelling  HPI: Johnathan [name] is a 67 y.o. male with medical history significant of paroxysmal A-fib on Tikosyn and as needed metoprolol, BPH, sleep apnea, hypertension hyperlipidemia, gout who presents to the emergency room with 1 day of throat pain and swelling.  According to the patient, he had developed gouty attack of his right great toe 2 days ago and took colchicine.  Today morning he woke up with sore throat, rapidly worsening, more on the left side of the throat, subjective fever.  He had painful swallowing.  He also had mild headache, neck pain.  Without any high fever, chills, shortness of breath or stridor.  He went to urgent care and had negative strep test, was sent home with Tylenol.  With persistent symptoms, he did a video visit and was asked to go to the ER. By the daytime, he had rapidly worsening swelling of the left tonsil, low-grade fever chills and malaise. Denies any sick contacts.  Denies any recent travel. ED Course: Low-grade fever, WBC count 21.4, sodium and chloride at 128/94 respectively.  Lactic acid normal. COVID-19 and rapid strep test negative at the urgent care, throat cultures pending. CT scan soft tissue of the neck with asymmetrical edema of the supraglottic area, no drainable abscess. ER physician discussed case with ENT and they recommended to start with Unasyn and dexamethasone, patient had some relief of symptoms.  Sent to [facility] for admission.  ENT will follow-up.  Review of Systems: all systems are reviewed and pertinent positive as per HPI otherwise rest are negative.    Past Medical History:  Diagnosis Date   Arthritis    hands and feet   Cutaneous skin tags 01/09/2022   Dysrhythmia    a-fib    Elevated LFTs    Hearing aid worn 2021   right   Hyperlipidemia    Persistent atrial fibrillation (HCC)    RLS (restless legs syndrome)    Sleep apnea    does not use CPAP    Past Surgical History:  Procedure Laterality Date   ATRIAL FIBRILLATION ABLATION  2015   BONE EXCISION Right 05/09/2021   Procedure: RIGHT TIBIA EXCISION;  Surgeon: Bjorn Pippin, MD;  Location: [facility];  Service: Orthopedics;  Laterality: Right;   COLONOSCOPY     EXCISION MORTON'S NEUROMA Right 05/09/2021   Procedure: EXCISION OF RIGHT SECOND TOE MASS;  Surgeon: Terance Hart, MD;  Location: [facility];  Service: Orthopedics;  Laterality: Right;   FOOT SURGERY Left    HERNIA REPAIR     LUMBAR DISC SURGERY  2012   L3-L4, No hardware    Social history   reports that he has never smoked. He has quit using smokeless tobacco.  His smokeless tobacco use included chew. He reports current alcohol use of about 10.0 standard drinks of alcohol per week. He reports that he does not use drugs.  Allergies  Allergen Reactions   Sulfa Antibiotics     Unknown reaction in childhood    Family History  Problem Relation Age of Onset   Diabetes Mother    Diabetes Brother    Early death Brother    Heart disease Brother    Colon cancer Neg Hx    Esophageal  cancer Neg Hx    Rectal cancer Neg Hx    Stomach cancer Neg Hx    Liver disease Neg Hx    Pancreatic cancer Neg Hx    Colon polyps Neg Hx      Prior to Admission medications   Medication Sig Start Date End Date Taking? Authorizing Provider  allopurinol (ZYLOPRIM) 100 MG tablet Take 2 tablets (200 mg total) by mouth daily. 01/19/23   Kuneff, Renee A, DO  atorvastatin (LIPITOR) 40 MG tablet Take 1 tablet (40 mg total) by mouth daily. 01/19/23   Kuneff, Renee A, DO  colchicine 0.6 MG tablet 1 tab po with onset of gout, repeat 1 tab po in 3 hours.Then 1 tab po daily until resolved. 01/19/23   Kuneff, Renee A, DO  dofetilide  (TIKOSYN) 500 MCG capsule Take 1 capsule (500 mcg total) by mouth 2 (two) times daily. NEED OV. 05/08/23   Runell Gess, MD  escitalopram (LEXAPRO) 5 MG tablet Take 1 tablet (5 mg total) by mouth daily. 01/19/23   Kuneff, Renee A, DO  metoprolol tartrate (LOPRESSOR) 25 MG tablet Take 1 tablet (25 mg total) by mouth 2 (two) times daily as needed (HR >110). 11/15/22 02/18/23  Linton Rump, MD  naproxen (NAPROSYN) 500 MG tablet Take 1 tablet (500 mg total) by mouth 2 (two) times daily with a meal. Patient not taking: Reported on 02/18/2023 01/28/23   Felix Pacini A, DO  tamsulosin (FLOMAX) 0.4 MG CAPS capsule Take 1 capsule (0.4 mg total) by mouth daily. 01/19/23   Felix Pacini A, DO    Physical Exam: Vitals:   05/29/23 2308 05/29/23 2309 05/29/23 2310 05/29/23 2311  BP:    129/75  Pulse:  (!) 103 (!) 103 (!) 104  Resp:    18  Temp:    98.2 F (36.8 C)  TempSrc:    Oral  SpO2: 98% 95% 96% 95%  Weight:    108.7 kg  Height:    6\' 1"  (1.854 m)    Constitutional: NAD, calm, comfortable, on room air. Vitals:   05/29/23 2308 05/29/23 2309 05/29/23 2310 05/29/23 2311  BP:    129/75  Pulse:  (!) 103 (!) 103 (!) 104  Resp:    18  Temp:    98.2 F (36.8 C)  TempSrc:    Oral  SpO2: 98% 95% 96% 95%  Weight:    108.7 kg  Height:    6\' 1"  (1.854 m)   Eyes: PERRL, lids and conjunctivae normal ENMT: Mucous membranes are moist. Posterior pharynx with redness and erythema, uvula is deviated to the right side, significant swelling of the left tonsillar area, uvular edema.  Airway is open.  No drainage. Neck: normal, supple, no masses, no thyromegaly Respiratory: clear to auscultation bilaterally, no wheezing, no crackles. Normal respiratory effort. No accessory muscle use.  Cardiovascular: Regular rate and rhythm, no murmurs / rubs / gallops. No extremity edema. 2+ pedal pulses. No carotid bruits.  Abdomen: no tenderness, no masses palpated. No hepatosplenomegaly. Bowel sounds positive.   Musculoskeletal: no clubbing / cyanosis. No joint deformity upper and lower extremities. Good ROM, no contractures. Normal muscle tone.  Skin: no rashes, lesions, ulcers. No induration Neurologic: CN 2-12 grossly intact. Sensation intact, DTR normal. Strength 5/5 in all 4.  Psychiatric: Normal judgment and insight. Alert and oriented x 3. Normal mood.     Labs on Admission: I have personally reviewed following labs and imaging studies  CBC: Recent Labs  Lab  05/29/23 1832  WBC 21.4*  NEUTROABS 19.5*  HGB 15.7  HCT 44.9  MCV 87.0  PLT 270   Basic Metabolic Panel: Recent Labs  Lab 05/29/23 1832  NA 128*  K 3.8  CL 94*  CO2 22  GLUCOSE 131*  BUN 16  CREATININE 1.01  CALCIUM 9.0   GFR: Estimated Creatinine Clearance: 91.8 mL/min (by C-G formula based on SCr of 1.01 mg/dL). Liver Function Tests: No results for input(s): "AST", "ALT", "ALKPHOS", "BILITOT", "PROT", "ALBUMIN" in the last 168 hours. No results for input(s): "LIPASE", "AMYLASE" in the last 168 hours. No results for input(s): "AMMONIA" in the last 168 hours. Coagulation Profile: No results for input(s): "INR", "PROTIME" in the last 168 hours. Cardiac Enzymes: No results for input(s): "CKTOTAL", "CKMB", "CKMBINDEX", "TROPONINI" in the last 168 hours. BNP (last 3 results) No results for input(s): "PROBNP" in the last 8760 hours. HbA1C: No results for input(s): "HGBA1C" in the last 72 hours. CBG: No results for input(s): "GLUCAP" in the last 168 hours. Lipid Profile: No results for input(s): "CHOL", "HDL", "LDLCALC", "TRIG", "CHOLHDL", "LDLDIRECT" in the last 72 hours. Thyroid Function Tests: No results for input(s): "TSH", "T4TOTAL", "FREET4", "T3FREE", "THYROIDAB" in the last 72 hours. Anemia Panel: No results for input(s): "VITAMINB12", "FOLATE", "FERRITIN", "TIBC", "IRON", "RETICCTPCT" in the last 72 hours. Urine analysis: No results found for: "COLORURINE", "APPEARANCEUR", "LABSPEC", "PHURINE",  "GLUCOSEU", "HGBUR", "BILIRUBINUR", "KETONESUR", "PROTEINUR", "UROBILINOGEN", "NITRITE", "LEUKOCYTESUR"  Radiological Exams on Admission: CT Soft Tissue Neck W Contrast  Result Date: 05/29/2023 CLINICAL DATA:  Sore throat with difficulty swallowing. EXAM: CT NECK WITH CONTRAST TECHNIQUE: Multidetector CT imaging of the neck was performed using the standard protocol following the bolus administration of intravenous contrast. RADIATION DOSE REDUCTION: This exam was performed according to the departmental dose-optimization program which includes automated exposure control, adjustment of the mA and/or kV according to patient size and/or use of iterative reconstruction technique. CONTRAST:  75mL OMNIPAQUE IOHEXOL 300 MG/ML  SOLN COMPARISON:  07/28/2019 FINDINGS: Pharynx and larynx: Limited by motion artifact. Submucosal edema in the left pharynx and supraglottic larynx extending from the tonsillar fossa and uvula to the left aryepiglottic fold. The airway is patent with no collection. Bilateral palatine tonsilliths. Salivary glands: Unremarkable Thyroid: Normal Lymph nodes: None enlarged or heterogeneous. Vascular: Negative Limited intracranial: Negative Visualized orbits: Unremarkable Mastoids and visualized paranasal sinuses: Clear Skeleton: Unremarkable Upper chest: Clear apical lungs IMPRESSION: Asymmetric left pharyngeal and supraglottic laryngeal edema without abscess. Electronically Signed   By: Tiburcio Pea M.D.   On: 05/29/2023 19:48    EKG: Independently reviewed.  A-fib with RVR.  Now with sinus rhythm and telemetry monitor.  Assessment/Plan Principal Problem:   Supraglottic edema Active Problems:   Permanent atrial fibrillation (HCC)   Long-term use of high-risk medication   Hyperlipidemia   OSA on CPAP   Tonsillar abscess     1.  Peritonsillar abscess/supraglottic edema likely due to acute pharyngitis: Urgent care, rapid strep throat and COVID-19 test were negative.  Cultures are  pending.  Blood cultures are drawn and pending. Continue Unasyn, continue high-dose dexamethasone. There is no airway compromise or stridor.  Will allow clears and advance as tolerated. ENT to follow-up, likely conservative management.  2.  Paroxysmal A-fib with RVR: Currently sinus rhythm.  Hydrate.  Metoprolol as needed.  Resume Tikosyn.  Replace potassium to keep potassium more than 4.  Check potassium and magnesium with the morning labs.  3.  Hyperlipidemia, continue statin.  4.  Hypochloremic hyponatremia with dehydration: Start  IV fluids.  Allow clears.  Recheck in the morning.  5.  Gout, taken colchicine.  Continue allopurinol.  6.  Sleep apnea, does not use CPAP.   DVT prophylaxis: Lovenox subcu Code Status: Full code Family Communication: None at the bedside Disposition Plan: Home Consults called: ENT, called by ER. Admission status: Observation, cardiac telemetry monitor.  Need for hospitalization will be assessed every day.   Dorcas Carrow MD [facility] Pager [phone]

## 2023-05-30 DIAGNOSIS — J384 Edema of larynx: Secondary | ICD-10-CM | POA: Diagnosis not present

## 2023-05-30 LAB — CBC WITH DIFFERENTIAL/PLATELET
Abs Immature Granulocytes: 0 10*3/uL (ref 0.00–0.07)
Basophils Absolute: 0 10*3/uL (ref 0.0–0.1)
Basophils Relative: 0 %
Eosinophils Absolute: 0 10*3/uL (ref 0.0–0.5)
Eosinophils Relative: 0 %
HCT: 44.2 % (ref 39.0–52.0)
Hemoglobin: 15.1 g/dL (ref 13.0–17.0)
Lymphocytes Relative: 0 %
Lymphs Abs: 0 10*3/uL — ABNORMAL LOW (ref 0.7–4.0)
MCH: 30.4 pg (ref 26.0–34.0)
MCHC: 34.2 g/dL (ref 30.0–36.0)
MCV: 89.1 fL (ref 80.0–100.0)
Monocytes Absolute: 0 10*3/uL — ABNORMAL LOW (ref 0.1–1.0)
Monocytes Relative: 0 %
Neutro Abs: 27 10*3/uL — ABNORMAL HIGH (ref 1.7–7.7)
Neutrophils Relative %: 100 %
Platelets: 262 10*3/uL (ref 150–400)
RBC: 4.96 MIL/uL (ref 4.22–5.81)
RDW: 13.1 % (ref 11.5–15.5)
WBC: 27 10*3/uL — ABNORMAL HIGH (ref 4.0–10.5)
nRBC: 0 % (ref 0.0–0.2)
nRBC: 0 /100 WBC

## 2023-05-30 LAB — GLUCOSE, CAPILLARY: Glucose-Capillary: 127 mg/dL — ABNORMAL HIGH (ref 70–99)

## 2023-05-30 LAB — BASIC METABOLIC PANEL
Anion gap: 9 (ref 5–15)
BUN: 11 mg/dL (ref 8–23)
CO2: 23 mmol/L (ref 22–32)
Calcium: 8.6 mg/dL — ABNORMAL LOW (ref 8.9–10.3)
Chloride: 99 mmol/L (ref 98–111)
Creatinine, Ser: 0.91 mg/dL (ref 0.61–1.24)
GFR, Estimated: >60 mL/min (ref >60)
Glucose, Bld: 138 mg/dL — ABNORMAL HIGH (ref 70–99)
Potassium: 3.9 mmol/L (ref 3.5–5.1)
Sodium: 131 mmol/L — ABNORMAL LOW (ref 135–145)

## 2023-05-30 LAB — HIV ANTIBODY (ROUTINE TESTING W REFLEX): HIV Screen 4th Generation wRfx: NONREACTIVE

## 2023-05-30 LAB — CULTURE, BLOOD (ROUTINE X 2)

## 2023-05-30 LAB — PHOSPHORUS: Phosphorus: 3.5 mg/dL (ref 2.5–4.6)

## 2023-05-30 LAB — MAGNESIUM: Magnesium: 1.5 mg/dL — ABNORMAL LOW (ref 1.7–2.4)

## 2023-05-30 MED ORDER — AMOXICILLIN-POT CLAVULANATE 875-125 MG PO TABS: 1.0000 | ORAL_TABLET | Freq: Two times a day (BID) | ORAL | 0 refills | Status: AC

## 2023-05-30 MED ORDER — ENOXAPARIN SODIUM 60 MG/0.6ML IJ SOSY
50.0000 mg | PREFILLED_SYRINGE | Freq: Every day | INTRAMUSCULAR | Status: DC
  Administered 2023-05-30: 50 mg via SUBCUTANEOUS
  Filled 2023-05-30: qty 0.6

## 2023-05-30 MED ORDER — MAGNESIUM SULFATE 4 GM/100ML IV SOLN
4.0000 g | Freq: Once | INTRAVENOUS | Status: AC
  Administered 2023-05-30: 4 g via INTRAVENOUS
  Filled 2023-05-30: qty 100

## 2023-05-30 MED ORDER — MENTHOL 3 MG MT LOZG
1.0000 | LOZENGE | OROMUCOSAL | Status: DC | PRN
  Administered 2023-05-30: 3 mg via ORAL
  Filled 2023-05-30: qty 9

## 2023-05-30 MED ORDER — METHYLPREDNISOLONE 4 MG PO TBPK: ORAL_TABLET | ORAL | 0 refills | Status: DC

## 2023-05-30 MED ORDER — POTASSIUM CHLORIDE CRYS ER 20 MEQ PO TBCR
40.0000 meq | EXTENDED_RELEASE_TABLET | Freq: Once | ORAL | Status: AC
  Administered 2023-05-30: 40 meq via ORAL
  Filled 2023-05-30: qty 2

## 2023-05-30 NOTE — Hospital Course (Addendum)
67 y.o.m w/ PAF on Tikosyn and PRN metoprolol, BPH, sleep apnea, hypertension hyperlipidemia, gout who presents to the ED with 1 day of throat pain and swelling.Patient reported he developed gouty attack of his right great toe 2 days PA and took colchicine. On 5/31 at the morning he woke up with sore throat, rapidly worsening, more on the left side of the throat, subjective fever.  He had painful swallowing.  He also had mild headache, neck pain.  Without any high fever, chills, shortness of breath or stridor.    He was seen at the urgent care had negative strep test sent home with Tylenol but symptoms persisted had a video visit with his doctor and asked to go to the ED. By the daytime, he had rapidly worsening swelling of the left tonsil, low-grade fever chills and malaise. In the ED low-grade fever, leukocytosis 21.4 vital stable lactic acid normal COVID-19 and rapid strep test negative at the urgent care, throat cultures sent CT scan soft tissue of the neck with asymmetrical edema of the supraglottic area, no drainable abscess. EDP discussed case with ENT  Dr Wonda Cerise and they recommended to start with Unasyn and dexamethasone, patient had some relief of symptoms and was sent to [facility] for admission.  ENT will follow-up. Seen by ENT this morning patient reports neck pain significantly better able to swallow well does have mild scratchy sensation on swallowing.  No respiratory or airway issues.  On exam erythema with exudates on left tonsillar area. He has been cleared for discharge by ENT on Medrol Dosepak and oral antibiotics.  I requested him to follow-up with PCP to monitor his WBC count, if any worsening of pain fever chills throat pain or any concerning signs he is to return to ED immediately

## 2023-05-30 NOTE — Consult Note (Signed)
ENT CONSULT:  Reason for Consult: Tonsillitis   Referring Physician:  Lynden Oxford MD  HPI: Johnathan [name] is an 67 y.o. male with a history of atrial fibrillation who presented overnight to [facility] ED with pharyngitis/tonsillitis. ED Admitted due to concerning exam. CT Neck negative for abscess. Admitted to hospitalist overnight on IV Unasyn and decadron. Patient reports he feels much better, has less odynophagia, dysphagia, sensation of throat closing. NAEON, afebrile, oxygenating normally on room air.    Past Medical History:  Diagnosis Date   Arthritis    hands and feet   Cutaneous skin tags 01/09/2022   Dysrhythmia    a-fib   Elevated LFTs    Hearing aid worn 2021   right   Hyperlipidemia    Persistent atrial fibrillation (HCC)    RLS (restless legs syndrome)    Sleep apnea    does not use CPAP    Past Surgical History:  Procedure Laterality Date   ATRIAL FIBRILLATION ABLATION  2015   BONE EXCISION Right 05/09/2021   Procedure: RIGHT TIBIA EXCISION;  Surgeon: Bjorn Pippin, MD;  Location: [facility];  Service: Orthopedics;  Laterality: Right;   COLONOSCOPY     EXCISION MORTON'S NEUROMA Right 05/09/2021   Procedure: EXCISION OF RIGHT SECOND TOE MASS;  Surgeon: Terance Hart, MD;  Location: [facility];  Service: Orthopedics;  Laterality: Right;   FOOT SURGERY Left    HERNIA REPAIR     LUMBAR DISC SURGERY  2012   L3-L4, No hardware    Family History  Problem Relation Age of Onset   Diabetes Mother    Diabetes Brother    Early death Brother    Heart disease Brother    Colon cancer Neg Hx    Esophageal cancer Neg Hx    Rectal cancer Neg Hx    Stomach cancer Neg Hx    Liver disease Neg Hx    Pancreatic cancer Neg Hx    Colon polyps Neg Hx     Social History:  reports that he has never smoked. He has quit using smokeless tobacco.  His smokeless tobacco use included chew. He reports current alcohol use of about 10.0  standard drinks of alcohol per week. He reports that he does not use drugs.  Allergies:  Allergies  Allergen Reactions   Sulfa Antibiotics     Unknown reaction in childhood    Medications: I have reviewed the patient's current medications.  Results for orders placed or performed during the hospital encounter of 05/29/23 (from the past 48 hour(s))  CBC with Differential     Status: Abnormal   Collection Time: 05/29/23  6:32 PM  Result Value Ref Range   WBC 21.4 (H) 4.0 - 10.5 K/uL   RBC 5.16 4.22 - 5.81 MIL/uL   Hemoglobin 15.7 13.0 - 17.0 g/dL   HCT 81.1 91.4 - 78.2 %   MCV 87.0 80.0 - 100.0 fL   MCH 30.4 26.0 - 34.0 pg   MCHC 35.0 30.0 - 36.0 g/dL   RDW 95.6 21.3 - 08.6 %   Platelets 270 150 - 400 K/uL   nRBC 0.0 0.0 - 0.2 %   Neutrophils Relative % 90 %   Neutro Abs 19.5 (H) 1.7 - 7.7 K/uL   Lymphocytes Relative 4 %   Lymphs Abs 0.8 0.7 - 4.0 K/uL   Monocytes Relative 5 %   Monocytes Absolute 1.0 0.1 - 1.0 K/uL   Eosinophils Relative 0 %  Eosinophils Absolute 0.0 0.0 - 0.5 K/uL   Basophils Relative 0 %   Basophils Absolute 0.0 0.0 - 0.1 K/uL   Immature Granulocytes 1 %   Abs Immature Granulocytes 0.10 (H) 0.00 - 0.07 K/uL    Comment: Performed at [facility], [address] [place], [place] [address]  Basic metabolic panel     Status: Abnormal   Collection Time: 05/29/23  6:32 PM  Result Value Ref Range   Sodium 128 (L) 135 - 145 mmol/L   Potassium 3.8 3.5 - 5.1 mmol/L   Chloride 94 (L) 98 - 111 mmol/L   CO2 22 22 - 32 mmol/L   Glucose, Bld 131 (H) 70 - 99 mg/dL    Comment: Glucose reference range applies only to samples taken after fasting for at least 8 hours.   BUN 16 8 - 23 mg/dL   Creatinine, Ser 8.65 0.61 - 1.24 mg/dL   Calcium 9.0 8.9 - 78.4 mg/dL   GFR, Estimated >69 >62 mL/min    Comment: (NOTE) Calculated using the CKD-EPI Creatinine Equation (2021)    Anion gap 12 5 - 15    Comment: Performed at [facility], [address] [place], [place] [address]  Blood culture (routine x 2)     Status: None (Preliminary result)   Collection Time: 05/29/23  8:12 PM   Specimen: BLOOD  Result Value Ref Range   Specimen Description      BLOOD RIGHT ANTECUBITAL Performed at [facility], [address] [place], [place] [address]    Special Requests      BOTTLES DRAWN AEROBIC AND ANAEROBIC Blood Culture adequate volume Performed at [facility], [address] [place], [place] [address]    Culture      NO GROWTH < 12 HOURS Performed at [facility], [address] [place], [place] [address]    Report Status PENDING   Lactic acid, plasma     Status: None   Collection Time: 05/29/23  8:13 PM  Result Value Ref Range   Lactic Acid, Venous 1.4 0.5 - 1.9 mmol/L    Comment: Performed at [facility], [address] [place], [place] [address]  Blood culture (routine x 2)     Status: None (Preliminary result)   Collection Time: 05/29/23  8:13 PM   Specimen: BLOOD  Result Value Ref Range   Specimen Description      BLOOD LEFT ANTECUBITAL Performed at [facility], [address] [place], [place] [address]    Special Requests      BOTTLES DRAWN AEROBIC AND ANAEROBIC Blood Culture adequate volume Performed at [facility], [address] [place], [place] [address]    Culture      NO GROWTH < 12 HOURS Performed at [facility], [address], [place], [place] [address]    Report Status PENDING   Lactic acid, plasma     Status: None   Collection Time: 05/29/23 10:18 PM  Result Value Ref Range   Lactic Acid, Venous 1.4 0.5 - 1.9 mmol/L    Comment: Performed at [facility], [address] [place], [place] [address]  CBC with Differential/Platelet     Status: Abnormal   Collection Time: 05/30/23 12:38 AM  Result Value Ref Range   WBC 27.0 (H) 4.0 - 10.5 K/uL   RBC 4.96 4.22 -  5.81 MIL/uL   Hemoglobin 15.1 13.0 - 17.0 g/dL   HCT  16.1 09.6 - 04.5 %   MCV 89.1 80.0 - 100.0 fL   MCH 30.4 26.0 - 34.0 pg   MCHC 34.2 30.0 - 36.0 g/dL   RDW 40.9 81.1 - 91.4 %   Platelets 262 150 - 400 K/uL   nRBC 0.0 0.0 - 0.2 %   Neutrophils Relative % 100 %   Neutro Abs 27.0 (H) 1.7 - 7.7 K/uL   Lymphocytes Relative 0 %   Lymphs Abs 0.0 (L) 0.7 - 4.0 K/uL   Monocytes Relative 0 %   Monocytes Absolute 0.0 (L) 0.1 - 1.0 K/uL   Eosinophils Relative 0 %   Eosinophils Absolute 0.0 0.0 - 0.5 K/uL   Basophils Relative 0 %   Basophils Absolute 0.0 0.0 - 0.1 K/uL   nRBC 0 0 /100 WBC   Abs Immature Granulocytes 0.00 0.00 - 0.07 K/uL   Burr Cells PRESENT     Comment: Performed at [facility], [address], [place], [place] [address]  Basic metabolic panel     Status: Abnormal   Collection Time: 05/30/23 12:38 AM  Result Value Ref Range   Sodium 131 (L) 135 - 145 mmol/L   Potassium 3.9 3.5 - 5.1 mmol/L   Chloride 99 98 - 111 mmol/L   CO2 23 22 - 32 mmol/L   Glucose, Bld 138 (H) 70 - 99 mg/dL    Comment: Glucose reference range applies only to samples taken after fasting for at least 8 hours.   BUN 11 8 - 23 mg/dL   Creatinine, Ser 6.21 0.61 - 1.24 mg/dL   Calcium 8.6 (L) 8.9 - 10.3 mg/dL   GFR, Estimated >30 >86 mL/min    Comment: (NOTE) Calculated using the CKD-EPI Creatinine Equation (2021)    Anion gap 9 5 - 15    Comment: Performed at [facility], [address] [place], [place] [address]  Phosphorus     Status: None   Collection Time: 05/30/23 12:38 AM  Result Value Ref Range   Phosphorus 3.5 2.5 - 4.6 mg/dL    Comment: Performed at [facility], [address] [place], [place] [address]  Magnesium     Status: Abnormal   Collection Time: 05/30/23 12:38 AM  Result Value Ref Range   Magnesium 1.5 (L) 1.7 - 2.4 mg/dL    Comment: Performed at [facility], [address], [place], [place] [address]  HIV Antibody (routine testing w rflx)     Status: None   Collection Time: 05/30/23 12:38 AM   Result Value Ref Range   HIV Screen 4th Generation wRfx Non Reactive Non Reactive    Comment: Performed at [facility], [address] [place], [place] [address]  Glucose, capillary     Status: Abnormal   Collection Time: 05/30/23  8:23 AM  Result Value Ref Range   Glucose-Capillary 127 (H) 70 - 99 mg/dL    Comment: Glucose reference range applies only to samples taken after fasting for at least 8 hours.   Comment 1 Notify RN    Comment 2 Document in Chart     CT Soft Tissue Neck W Contrast  Result Date: 05/29/2023 CLINICAL DATA:  Sore throat with difficulty swallowing. EXAM: CT NECK WITH CONTRAST TECHNIQUE: Multidetector CT imaging of the neck was performed using the standard protocol following the bolus administration of intravenous contrast. RADIATION DOSE REDUCTION:  This exam was performed according to the departmental dose-optimization program which includes automated exposure control, adjustment of the mA and/or kV according to patient size and/or use of iterative reconstruction technique. CONTRAST:  75mL OMNIPAQUE IOHEXOL 300 MG/ML  SOLN COMPARISON:  07/28/2019 FINDINGS: Pharynx and larynx: Limited by motion artifact. Submucosal edema in the left pharynx and supraglottic larynx extending from the tonsillar fossa and uvula to the left aryepiglottic fold. The airway is patent with no collection. Bilateral palatine tonsilliths. Salivary glands: Unremarkable Thyroid: Normal Lymph nodes: None enlarged or heterogeneous. Vascular: Negative Limited intracranial: Negative Visualized orbits: Unremarkable Mastoids and visualized paranasal sinuses: Clear Skeleton: Unremarkable Upper chest: Clear apical lungs IMPRESSION: Asymmetric left pharyngeal and supraglottic laryngeal edema without abscess. Electronically Signed   By: Tiburcio Pea M.D.   On: 05/29/2023 19:48    WJX:[id number] other than stated per HPI  Blood pressure 117/66, pulse 61, temperature 97.9 F (36.6 C), temperature source  Oral, resp. rate 20, height 6\' 1"  (1.854 m), weight 108.7 kg, SpO2 96 %.  PHYSICAL EXAM:  CONSTITUTIONAL: well developed, nourished, no distress and alert and oriented x 3 EYES: PERRL, EOMI  HENT: Head : normocephalic and atraumatic Nose: nose normal and no purulence Mouth/Throat:  Mouth: uvula midline and no oral lesions Throat: Left tonsillar enlargement erythema/exudates present c/w tonsillitis.  NECK: supple, trachea normal and no thyromegaly or cervical LAD NEURO: CN II-XII symmetric intact   Studies Reviewed:CT neck with contrast 05/29/23  FINDINGS: Pharynx and larynx: Limited by motion artifact. Submucosal edema in the left pharynx and supraglottic larynx extending from the tonsillar fossa and uvula to the left aryepiglottic fold. The airway is patent with no collection. Bilateral palatine tonsilliths.   Salivary glands: Unremarkable   Thyroid: Normal   Lymph nodes: None enlarged or heterogeneous.   Vascular: Negative   Limited intracranial: Negative   Visualized orbits: Unremarkable   Mastoids and visualized paranasal sinuses: Clear   Skeleton: Unremarkable   Upper chest: Clear apical lungs   IMPRESSION: Asymmetric left pharyngeal and supraglottic laryngeal edema without abscess.     Electronically Signed   By: Tiburcio Pea M.D.   On: 05/29/2023 19:48      Assessment/Plan: 67 y/o M with left tonsillitis/pharyngitis, responding well to IV abx and steroids.   Recommendations: - No surgery or intervention indicated  - OK to DC from ENT standpoint on 10 days augmentin and medrol dose pack  - Return precautions given - Please place ENT Clinic referral on discharge; can discuss outpatient tonsillectomy    I have personally spent 22 minutes involved in face-to-face and non-face-to-face activities for this patient on the day of the visit.  Professional time spent includes the following activities, in addition to those noted in the documentation:  preparing to see the patient (eg, review of tests), obtaining and/or reviewing separately obtained history, performing a medically appropriate examination and/or evaluation, counseling and educating the patient/family/caregiver, ordering medications, tests or procedures, referring and communicating with other healthcare professionals, documenting clinical information in the electronic or other health record, independently interpreting results and communicating results with the patient/family/caregiver, care coordination.  Electronically signed by:  Scarlette Ar, MD  Staff Physician Facial Plastic & Reconstructive Surgery Otolaryngology - Head and Neck Surgery [facility]   05/30/2023, 8:58 AM

## 2023-05-30 NOTE — Discharge Summary (Signed)
Physician Discharge Summary  Johnathan [name] UEA:[id number] DOB: 03-14-1956 DOA: 05/29/2023  PCP: Natalia Leatherwood, DO  Admit date: 05/29/2023 Discharge date: 05/30/2023 Recommendations for Outpatient Follow-up:  Follow up with PCP in 1 weeks-call for appointment Please obtain BMP/CBC in one week  Discharge Dispo: HOME Discharge Condition: Stable Code Status:   Code Status: Full Code Diet recommendation:  Diet Order             Diet clear liquid Fluid consistency: Thin  Diet effective now                    Brief/Interim Summary: 67 y.o.m w/ PAF on Tikosyn and PRN metoprolol, BPH, sleep apnea, hypertension hyperlipidemia, gout who presents to the ED with 1 day of throat pain and swelling.Patient reported he developed gouty attack of his right great toe 2 days PA and took colchicine. On 5/31 at the morning he woke up with sore throat, rapidly worsening, more on the left side of the throat, subjective fever.  He had painful swallowing.  He also had mild headache, neck pain.  Without any high fever, chills, shortness of breath or stridor.    He was seen at the urgent care had negative strep test sent home with Tylenol but symptoms persisted had a video visit with his doctor and asked to go to the ED. By the daytime, he had rapidly worsening swelling of the left tonsil, low-grade fever chills and malaise. In the ED low-grade fever, leukocytosis 21.4 vital stable lactic acid normal COVID-19 and rapid strep test negative at the urgent care, throat cultures sent CT scan soft tissue of the neck with asymmetrical edema of the supraglottic area, no drainable abscess. EDP discussed case with ENT  Dr Wonda Cerise and they recommended to start with Unasyn and dexamethasone, patient had some relief of symptoms and was sent to [facility] for admission.  ENT will follow-up. Seen by ENT this morning patient reports neck pain significantly better able to swallow well does have mild scratchy sensation on  swallowing.  No respiratory or airway issues.  On exam erythema with exudates on left tonsillar area. He has been cleared for discharge by ENT on Medrol Dosepak and oral antibiotics.  I requested him to follow-up with PCP to monitor his WBC count, if any worsening of pain fever chills throat pain or any concerning signs he is to return to ED immediately   Discharge Diagnoses:  Principal Problem:   Supraglottic edema Active Problems:   Permanent atrial fibrillation (HCC)   Long-term use of high-risk medication   Hyperlipidemia   OSA on CPAP   Tonsillar abscess  Acute left tonsillitis and pharyngitis  Asymmetric left pharyngeal and supraglottic laryngeal edema: CT soft tissues and neck with contrast with no drainable abscess.  ENT was consulted in  patient is afebrile, does have leukocytosis but uptrending however is getting IV Decadron, continued on Unasyn.  As per report negative Strept throat in UC.  Blood culture no growth to date.Seen by ENT this morning patient reports neck pain significantly better able to swallow well does have mild scratchy sensation on swallowing.  No respiratory or airway issues.  On exam erythema with exudates on left tonsillar area. He has been cleared for discharge by ENT on Medrol Dosepak and oral antibiotics.  I requested him to follow-up with PCP to monitor his WBC count, if any worsening of pain fever chills throat pain or any concerning signs, he is to return to ED immediately  Leukocytosis in the setting of 1 also now contributed by steroid. PAF - in sinus rhythm continue home Tikosyn monitor electrolytes HLD on statin Hypochloremic hyponatremia : improving Gout with recent flareup has taken colchicine, continue allopurinol OSA not using CPAP at baseline.  Consults: ENT  Subjective: Alert awake oriented resting comfortably he feels much better, wife at the bedside, agreeable for discharge today as mentioned by ENT currently  Discharge Exam: Vitals:    05/30/23 0422 05/30/23 0825  BP: (!) 109/59 117/66  Pulse: 65 61  Resp: 18 20  Temp: 98 F (36.7 C) 97.9 F (36.6 C)  SpO2: 96% 96%   General: Pt is alert, awake, not in acute distress Cardiovascular: RRR, S1/S2 +, no rubs, no gallops Respiratory: CTA bilaterally, no wheezing, no rhonchi Abdominal: Soft, NT, ND, bowel sounds + Extremities: no edema, no cyanosis  Discharge Instructions  Discharge Instructions     Discharge instructions   Complete by: As directed    Please call call MD or return to ER for similar or worsening recurring problem that brought you to hospital or if any fever,nausea/vomiting,abdominal pain, uncontrolled pain, chest pain,  shortness of breath or any other alarming symptoms.  Please follow-up your doctor as instructed in a week time and call the office for appointment.  Please avoid alcohol, smoking, or any other illicit substance and maintain healthy habits including taking your regular medications as prescribed.  You were cared for by a hospitalist during your hospital stay. If you have any questions about your discharge medications or the care you received while you were in the hospital after you are discharged, you can call the unit and ask to speak with the hospitalist on call if the hospitalist that took care of you is not available.  Once you are discharged, your primary care physician will handle any further medical issues. Please note that NO REFILLS for any discharge medications will be authorized once you are discharged, as it is imperative that you return to your primary care physician (or establish a relationship with a primary care physician if you do not have one) for your aftercare needs so that they can reassess your need for medications and monitor your lab values   Increase activity slowly   Complete by: As directed       Allergies as of 05/30/2023       Reactions   Sulfa Antibiotics    Unknown reaction in childhood         Medication List     TAKE these medications    allopurinol 100 MG tablet Commonly known as: ZYLOPRIM Take 2 tablets (200 mg total) by mouth daily.   amoxicillin-clavulanate 875-125 MG tablet Commonly known as: AUGMENTIN Take 1 tablet by mouth 2 (two) times daily for 10 days.   atorvastatin 40 MG tablet Commonly known as: LIPITOR Take 1 tablet (40 mg total) by mouth daily.   colchicine 0.6 MG tablet 1 tab po with onset of gout, repeat 1 tab po in 3 hours.Then 1 tab po daily until resolved.   dofetilide 500 MCG capsule Commonly known as: TIKOSYN Take 1 capsule (500 mcg total) by mouth 2 (two) times daily. NEED OV.   escitalopram 5 MG tablet Commonly known as: LEXAPRO Take 1 tablet (5 mg total) by mouth daily.   methylPREDNISolone 4 MG Tbpk tablet Commonly known as: MEDROL DOSEPAK Take MEDROL DOSE PACK as instructed   metoprolol tartrate 25 MG tablet Commonly known as: LOPRESSOR Take 1 tablet (25 mg total)  by mouth 2 (two) times daily as needed (HR >110).   naproxen 500 MG tablet Commonly known as: Naprosyn Take 1 tablet (500 mg total) by mouth 2 (two) times daily with a meal.   tamsulosin 0.4 MG Caps capsule Commonly known as: FLOMAX Take 1 capsule (0.4 mg total) by mouth daily.        Follow-up Information     Kuneff, Renee A, DO Follow up in 1 week(s).   Specialty: Family Medicine Contact information: [address] [place] [address] [phone]                Allergies  Allergen Reactions   Sulfa Antibiotics     Unknown reaction in childhood    The results of significant diagnostics from this hospitalization (including imaging, microbiology, ancillary and laboratory) are listed below for reference.    Microbiology: Recent Results (from the past 240 hour(s))  Blood culture (routine x 2)     Status: None (Preliminary result)   Collection Time: 05/29/23  8:12 PM   Specimen: BLOOD  Result Value Ref Range Status   Specimen Description    Final    BLOOD RIGHT ANTECUBITAL Performed at [facility], [address] [place], [place] [address]    Special Requests   Final    BOTTLES DRAWN AEROBIC AND ANAEROBIC Blood Culture adequate volume Performed at [facility], [address] [place], [place] [address]    Culture   Final    NO GROWTH < 12 HOURS Performed at [facility], [address], [place], [place] [address]    Report Status PENDING  Incomplete  Blood culture (routine x 2)     Status: None (Preliminary result)   Collection Time: 05/29/23  8:13 PM   Specimen: BLOOD  Result Value Ref Range Status   Specimen Description   Final    BLOOD LEFT ANTECUBITAL Performed at [facility], [address] [place], [place] [address]    Special Requests   Final    BOTTLES DRAWN AEROBIC AND ANAEROBIC Blood Culture adequate volume Performed at [facility], [address] [place], [place] [address]    Culture   Final    NO GROWTH < 12 HOURS Performed at [facility], [address] [place], [place] [address]    Report Status PENDING  Incomplete    Procedures/Studies: CT Soft Tissue Neck W Contrast  Result Date: 05/29/2023 CLINICAL DATA:  Sore throat with difficulty swallowing. EXAM: CT NECK WITH CONTRAST TECHNIQUE: Multidetector CT imaging of the neck was performed using the standard protocol following the bolus administration of intravenous contrast. RADIATION DOSE REDUCTION: This exam was performed according to the departmental dose-optimization program which includes automated exposure control, adjustment of the mA and/or kV according to patient size and/or use of iterative reconstruction technique. CONTRAST:  75mL OMNIPAQUE IOHEXOL 300 MG/ML  SOLN COMPARISON:  07/28/2019 FINDINGS: Pharynx and larynx: Limited by motion artifact. Submucosal edema in the left pharynx and supraglottic larynx extending from the tonsillar fossa and uvula to the left aryepiglottic fold. The  airway is patent with no collection. Bilateral palatine tonsilliths. Salivary glands: Unremarkable Thyroid: Normal Lymph nodes: None enlarged or heterogeneous. Vascular: Negative Limited intracranial: Negative Visualized orbits: Unremarkable Mastoids and visualized paranasal sinuses: Clear Skeleton: Unremarkable Upper chest: Clear apical lungs IMPRESSION: Asymmetric left pharyngeal and supraglottic laryngeal edema without abscess. Electronically Signed   By: Audry Riles.D.  On: 05/29/2023 19:48    Labs: BNP (last 3 results) No results for input(s): "BNP" in the last 8760 hours. Basic Metabolic Panel: Recent Labs  Lab 05/29/23 1832 05/30/23 0038  NA 128* 131*  K 3.8 3.9  CL 94* 99  CO2 22 23  GLUCOSE 131* 138*  BUN 16 11  CREATININE 1.01 0.91  CALCIUM 9.0 8.6*  MG  --  1.5*  PHOS  --  3.5   Liver Function Tests: No results for input(s): "AST", "ALT", "ALKPHOS", "BILITOT", "PROT", "ALBUMIN" in the last 168 hours. No results for input(s): "LIPASE", "AMYLASE" in the last 168 hours. No results for input(s): "AMMONIA" in the last 168 hours. CBC: Recent Labs  Lab 05/29/23 1832 05/30/23 0038  WBC 21.4* 27.0*  NEUTROABS 19.5* 27.0*  HGB 15.7 15.1  HCT 44.9 44.2  MCV 87.0 89.1  PLT 270 262   Cardiac Enzymes: No results for input(s): "CKTOTAL", "CKMB", "CKMBINDEX", "TROPONINI" in the last 168 hours. BNP: Invalid input(s): "POCBNP" CBG: Recent Labs  Lab 05/30/23 0823  GLUCAP 127*   D-Dimer No results for input(s): "DDIMER" in the last 72 hours. Hgb A1c No results for input(s): "HGBA1C" in the last 72 hours. Lipid Profile No results for input(s): "CHOL", "HDL", "LDLCALC", "TRIG", "CHOLHDL", "LDLDIRECT" in the last 72 hours. Thyroid function studies No results for input(s): "TSH", "T4TOTAL", "T3FREE", "THYROIDAB" in the last 72 hours.  Invalid input(s): "FREET3" Anemia work up No results for input(s): "VITAMINB12", "FOLATE", "FERRITIN", "TIBC", "IRON", "RETICCTPCT"  in the last 72 hours. Urinalysis No results found for: "COLORURINE", "APPEARANCEUR", "LABSPEC", "PHURINE", "GLUCOSEU", "HGBUR", "BILIRUBINUR", "KETONESUR", "PROTEINUR", "UROBILINOGEN", "NITRITE", "LEUKOCYTESUR" Sepsis Labs Recent Labs  Lab 05/29/23 1832 05/30/23 0038  WBC 21.4* 27.0*   Microbiology Recent Results (from the past 240 hour(s))  Blood culture (routine x 2)     Status: None (Preliminary result)   Collection Time: 05/29/23  8:12 PM   Specimen: BLOOD  Result Value Ref Range Status   Specimen Description   Final    BLOOD RIGHT ANTECUBITAL Performed at [facility], [address] [place], [place] [address]    Special Requests   Final    BOTTLES DRAWN AEROBIC AND ANAEROBIC Blood Culture adequate volume Performed at [facility], [address] [place], [place] [address]    Culture   Final    NO GROWTH < 12 HOURS Performed at [facility], [address] [place], [place] [address]    Report Status PENDING  Incomplete  Blood culture (routine x 2)     Status: None (Preliminary result)   Collection Time: 05/29/23  8:13 PM   Specimen: BLOOD  Result Value Ref Range Status   Specimen Description   Final    BLOOD LEFT ANTECUBITAL Performed at [facility], [address] [place], [place] [address]    Special Requests   Final    BOTTLES DRAWN AEROBIC AND ANAEROBIC Blood Culture adequate volume Performed at [facility], [address] [place], [place] [address]    Culture   Final    NO GROWTH < 12 HOURS Performed at [facility], [address], [place], [place] [address]    Report Status PENDING  Incomplete     Time coordinating discharge: 25 minutes  SIGNED: Lanae Boast, MD  [facility] 05/30/2023, 11:07 AM  If 7PM-7AM, please contact night-coverage [email or web address]

## 2023-06-01 ENCOUNTER — Telehealth: Payer: Self-pay

## 2023-06-01 LAB — CULTURE, BLOOD (ROUTINE X 2)
Special Requests: ADEQUATE
Special Requests: ADEQUATE

## 2023-06-01 NOTE — Transitions of Care (Post Inpatient/ED Visit) (Signed)
06/01/2023  Name: Johnathan [name] MRN: [id number] DOB: [birth year]  Today's TOC FU Call Status: Today's TOC FU Call Status:: Successful TOC FU Call Competed TOC FU Call Complete Date: 06/01/23  Transition Care Management Follow-up Telephone Call Date of Discharge: 05/30/23 Discharge Facility: [facility]) Type of Discharge: Inpatient Admission Primary Inpatient Discharge Diagnosis:: Supraglottic edema How have you been since you were released from the hospital?: Better Any questions or concerns?: No  Items Reviewed: Did you receive and understand the discharge instructions provided?: Yes Medications obtained,verified, and reconciled?: Yes (Medications Reviewed) Any new allergies since your discharge?: No  Medications Reviewed Today: Medications Reviewed Today     Reviewed by Merleen Nicely, LPN (Licensed Practical Nurse) on 06/01/23 at 1150  Med List Status: <None>   Medication Order Taking? Sig Documenting Provider Last Dose Status Informant  allopurinol (ZYLOPRIM) 100 MG tablet [id number] Yes Take 2 tablets (200 mg total) by mouth daily. Felix Pacini A, DO Taking Active Self, Pharmacy Records  amoxicillin-clavulanate (AUGMENTIN) 875-125 MG tablet [id number] Yes Take 1 tablet by mouth 2 (two) times daily for 10 days. Lanae Boast, MD Taking Active   atorvastatin (LIPITOR) 40 MG tablet [id number] Yes Take 1 tablet (40 mg total) by mouth daily. Felix Pacini A, DO Taking Active Self, Pharmacy Records  colchicine 0.6 MG tablet [id number] No 1 tab po with onset of gout, repeat 1 tab po in 3 hours.Then 1 tab po daily until resolved.  Patient not taking: Reported on 06/01/2023   Natalia Leatherwood, DO Not Taking Active Self, Pharmacy Records  dofetilide [facility]) 500 MCG capsule [id number] Yes Take 1 capsule (500 mcg total) by mouth 2 (two) times daily. NEED OV. Runell Gess, MD Taking Active Self, Pharmacy Records  escitalopram [facility]) 5 MG tablet [id number] Yes Take 1 tablet (5 mg total)  by mouth daily. Claiborne Billings, Renee A, DO Taking Active Self, Pharmacy Records  methylPREDNISolone (MEDROL DOSEPAK) 4 MG TBPK tablet [id number] Yes Take MEDROL DOSE PACK as instructed Kc, Ramesh, MD Taking Active   metoprolol tartrate (LOPRESSOR) 25 MG tablet [id number] Yes Take 1 tablet (25 mg total) by mouth 2 (two) times daily as needed (HR >110). Linton Rump, MD Taking Active   naproxen (NAPROSYN) 500 MG tablet [id number] No Take 1 tablet (500 mg total) by mouth 2 (two) times daily with a meal.  Patient not taking: Reported on 02/18/2023   Felix Pacini A, DO Not Taking Active Self, Pharmacy Records  tamsulosin [facility]) 0.4 MG CAPS capsule [id number] Yes Take 1 capsule (0.4 mg total) by mouth daily. Natalia Leatherwood, DO Taking Active Self, Pharmacy Records            Home Care and Equipment/Supplies: Were Home Health Services Ordered?: No Any new equipment or medical supplies ordered?: No  Functional Questionnaire: Do you need assistance with bathing/showering or dressing?: No Do you need assistance with meal preparation?: No Do you need assistance with eating?: No Do you have difficulty maintaining continence: No Do you need assistance with getting out of bed/getting out of a chair/moving?: No Do you have difficulty managing or taking your medications?: No  Follow up appointments reviewed: PCP Follow-up appointment confirmed?: Yes Date of PCP follow-up appointment?: 06/04/23 Follow-up Provider: Felix Pacini DO Specialist Hospital Follow-up appointment confirmed?: No Do you need transportation to your follow-up appointment?: No Do you understand care options if your condition(s) worsen?: Yes-patient verbalized understanding    SIGNATURE  [name] LPN [facility] Nurse Health Advisor Direct Dial [phone]

## 2023-06-02 ENCOUNTER — Telehealth: Payer: Self-pay | Admitting: Family Medicine

## 2023-06-02 LAB — CULTURE, BLOOD (ROUTINE X 2)

## 2023-06-02 NOTE — Telephone Encounter (Signed)
Johnathan [name] was released from the hospital on Sat. Johnathan [name] was told by the hospital to have labs to have his white blood cell count checked. Johnathan [name] has an appointment on Thursday, however the hospital did not want him to wait until that appointment. Please give the patient a call to discuss if Johnathan [name] needs to come in for  labs today. I did not see an order in the system for him, but did inform him someone will give him a call to discuss.

## 2023-06-02 NOTE — Telephone Encounter (Signed)
Patient was discharged on 6/1 and was told to have PCP follow-up and blood work done in 1 week. His hospital follow-up occurs less than 1 week from his discharge last lab work, so his appointment time should be appropriate.

## 2023-06-03 LAB — CULTURE, BLOOD (ROUTINE X 2)
Culture: NO GROWTH
Culture: NO GROWTH

## 2023-06-04 ENCOUNTER — Ambulatory Visit: Payer: 59 | Admitting: Family Medicine

## 2023-06-04 ENCOUNTER — Encounter: Payer: Self-pay | Admitting: Family Medicine

## 2023-06-04 VITALS — BP 138/88 | HR 63 | Temp 97.8°F | Wt 227.0 lb

## 2023-06-04 DIAGNOSIS — E871 Hypo-osmolality and hyponatremia: Secondary | ICD-10-CM | POA: Diagnosis not present

## 2023-06-04 DIAGNOSIS — M25529 Pain in unspecified elbow: Secondary | ICD-10-CM

## 2023-06-04 DIAGNOSIS — Z9289 Personal history of other medical treatment: Secondary | ICD-10-CM | POA: Diagnosis not present

## 2023-06-04 DIAGNOSIS — J36 Peritonsillar abscess: Secondary | ICD-10-CM

## 2023-06-04 DIAGNOSIS — D72825 Bandemia: Secondary | ICD-10-CM

## 2023-06-04 DIAGNOSIS — G479 Sleep disorder, unspecified: Secondary | ICD-10-CM

## 2023-06-04 DIAGNOSIS — J384 Edema of larynx: Secondary | ICD-10-CM

## 2023-06-04 NOTE — Patient Instructions (Signed)
No follow-ups on file.        Great to see you today.  I have refilled the medication(s) we provide.   If labs were collected, we will inform you of lab results once received either by echart message or telephone call.   - echart message- for normal results that have been seen by the patient already.   - telephone call: abnormal results or if patient has not viewed results in their echart.  

## 2023-06-04 NOTE — Progress Notes (Unsigned)
Johnathan [name] , Jan 26, 1956, 67 y.o., male MRN: [id number] Patient Care Team    Relationship Specialty Notifications Start End  Natalia Leatherwood, DO PCP - General Family Medicine  04/15/17   Runell Gess, MD PCP - Cardiology Cardiology  12/21/19   Sim Boast PCP - Pulmonology Family Medicine  12/21/19   Runell Gess, MD Consulting Physician Cardiology  03/22/18   Hilarie Fredrickson, MD Consulting Physician Gastroenterology  07/19/19   Lenda Kelp, MD Consulting Physician Sports Medicine  03/07/20   Bjorn Pippin, MD Consulting Physician Orthopedic Surgery  03/07/20   Lennette Bihari, MD Consulting Physician Cardiology  04/02/20     Chief Complaint  Patient presents with   Swelling of tonsils     Subjective:  Johnathan [name]  is a 67 y.o. male presents for hospital follow up after recent admission on 05/29/2023 for primary diagnosis of supraglottic edema.  Patient was discharged on 05/30/2023 to home. Patients discharge summary has been reviewed, as well as all labs/image studies obtained during hospitalization.  Medication reconciliation completed today.  Patients hospital course: Patient presented to the emergency room after 1 day of throat pain and swelling.  He was seen in the urgent care prior to that for a gout flare in which she took colchicine.  He woke up the following day with sore throat and painful swallowing.  He had a negative strep test in the urgent care.  By the following day he had a rapidly swollen left side of his throat.  COVID-19 were negative, throat cultures were sent do not have results for these.  He was seen in the ED with low-grade fever, leukocytosis 21.4 and a stable lactic acid.  Unasyn and dexamethasone were started and patient was admitted for hospitalization. He significantly improved overnight and was discharged on a Medrol Dosepak and Augmentin.  Since hospital discharge patient reports he has done very well.  Tolerating p.o., not having any problems  swallowing.  No fevers or chills.  Sleep disturbance He does want to discuss his sleep disturbance.  He states he cannot fall asleep or stay asleep well.  He is wondering if there is anything he can safely take with his cardiac medicines to help him sleep.  Elbow pain: He also states he has had recurrent elbow pain and would like to be referred back to his orthopedic Dr. Everardo Pacific  No results for input(s): "HGB", "HCT", "WBC", "PLT" in the last 168 hours.     Latest Ref Rng & Units 06/04/2023    8:28 AM 05/30/2023   12:38 AM 05/29/2023    6:32 PM  CMP  Glucose 65 - 99 mg/dL 92  409  811   BUN 7 - 25 mg/dL 18  11  16    Creatinine 0.70 - 1.35 mg/dL 9.14  7.82  9.56   Sodium 135 - 146 mmol/L 138  131  128   Potassium 3.5 - 5.3 mmol/L 4.7  3.9  3.8   Chloride 98 - 110 mmol/L 99  99  94   CO2 20 - 32 mmol/L 24  23  22    Calcium 8.6 - 10.3 mg/dL 8.6 - 21.3 mg/dL 9.7    9.7  8.6  9.0       CT Soft Tissue Neck W Contrast  Result Date: 05/29/2023 CLINICAL DATA:  Sore throat with difficulty swallowing. EXAM: CT NECK WITH CONTRAST TECHNIQUE: Multidetector CT imaging of the neck was performed using the standard  protocol following the bolus administration of intravenous contrast. RADIATION DOSE REDUCTION: This exam was performed according to the departmental dose-optimization program which includes automated exposure control, adjustment of the mA and/or kV according to patient size and/or use of iterative reconstruction technique. CONTRAST:  75mL OMNIPAQUE IOHEXOL 300 MG/ML  SOLN COMPARISON:  07/28/2019 FINDINGS: Pharynx and larynx: Limited by motion artifact. Submucosal edema in the left pharynx and supraglottic larynx extending from the tonsillar fossa and uvula to the left aryepiglottic fold. The airway is patent with no collection. Bilateral palatine tonsilliths. Salivary glands: Unremarkable Thyroid: Normal Lymph nodes: None enlarged or heterogeneous. Vascular: Negative Limited intracranial: Negative  Visualized orbits: Unremarkable Mastoids and visualized paranasal sinuses: Clear Skeleton: Unremarkable Upper chest: Clear apical lungs IMPRESSION: Asymmetric left pharyngeal and supraglottic laryngeal edema without abscess. Electronically Signed   By: Tiburcio Pea M.D.   On: 05/29/2023 19:48        06/04/2023    9:40 AM 01/26/2023    8:51 AM 08/01/2022    4:05 PM 01/09/2022    8:45 AM 05/29/2021    7:59 AM  Depression screen PHQ 2/9  Decreased Interest 0 0 0 0 0  Down, Depressed, Hopeless 0 0 0 0 0  PHQ - 2 Score 0 0 0 0 0  Altered sleeping   1 1   Tired, decreased energy   0 0   Change in appetite   0 0   Feeling bad or failure about yourself    0 0   Trouble concentrating   0 0   Moving slowly or fidgety/restless   0 0   Suicidal thoughts   0 0   PHQ-9 Score   1 1     Allergies  Allergen Reactions   Sulfa Antibiotics     Unknown reaction in childhood   Social History   Tobacco Use   Smoking status: Never   Smokeless tobacco: Former    Types: Chew  Substance Use Topics   Alcohol use: Yes    Alcohol/week: 10.0 standard drinks of alcohol    Types: 10 Cans of beer per week    Comment: social   Past Medical History:  Diagnosis Date   Arthritis    hands and feet   Cutaneous skin tags 01/09/2022   Dysrhythmia    a-fib   Elevated LFTs    Hearing aid worn 2021   right   Hyperlipidemia    Persistent atrial fibrillation (HCC)    RLS (restless legs syndrome)    Sleep apnea    does not use CPAP   Past Surgical History:  Procedure Laterality Date   ATRIAL FIBRILLATION ABLATION  2015   BONE EXCISION Right 05/09/2021   Procedure: RIGHT TIBIA EXCISION;  Surgeon: Bjorn Pippin, MD;  Location: [facility];  Service: Orthopedics;  Laterality: Right;   COLONOSCOPY     EXCISION MORTON'S NEUROMA Right 05/09/2021   Procedure: EXCISION OF RIGHT SECOND TOE MASS;  Surgeon: Terance Hart, MD;  Location: [facility];  Service: Orthopedics;  Laterality:  Right;   FOOT SURGERY Left    HERNIA REPAIR     LUMBAR DISC SURGERY  2012   L3-L4, No hardware   Family History  Problem Relation Age of Onset   Diabetes Mother    Diabetes Brother    Early death Brother    Heart disease Brother    Colon cancer Neg Hx    Esophageal cancer Neg Hx    Rectal cancer  Neg Hx    Stomach cancer Neg Hx    Liver disease Neg Hx    Pancreatic cancer Neg Hx    Colon polyps Neg Hx    Allergies as of 06/04/2023       Reactions   Sulfa Antibiotics    Unknown reaction in childhood        Medication List        Accurate as of June 04, 2023 11:59 PM. If you have any questions, ask your nurse or doctor.          STOP taking these medications    metoprolol tartrate 25 MG tablet Commonly known as: LOPRESSOR Stopped by: Felix Pacini, DO   naproxen 500 MG tablet Commonly known as: Naprosyn Stopped by: Felix Pacini, DO       TAKE these medications    allopurinol 100 MG tablet Commonly known as: ZYLOPRIM Take 2 tablets (200 mg total) by mouth daily.   amoxicillin-clavulanate 875-125 MG tablet Commonly known as: AUGMENTIN Take 1 tablet by mouth 2 (two) times daily for 10 days.   atorvastatin 40 MG tablet Commonly known as: LIPITOR Take 1 tablet (40 mg total) by mouth daily.   colchicine 0.6 MG tablet 1 tab po with onset of gout, repeat 1 tab po in 3 hours.Then 1 tab po daily until resolved.   dofetilide 500 MCG capsule Commonly known as: TIKOSYN Take 1 capsule (500 mcg total) by mouth 2 (two) times daily. NEED OV.   escitalopram 5 MG tablet Commonly known as: LEXAPRO Take 1 tablet (5 mg total) by mouth daily.   hydrOXYzine 25 MG tablet Commonly known as: ATARAX Take 1-2 tablets (25-50 mg total) by mouth at bedtime as needed. Started by: Felix Pacini, DO   methylPREDNISolone 4 MG Tbpk tablet Commonly known as: MEDROL DOSEPAK Take MEDROL DOSE PACK as instructed   tamsulosin 0.4 MG Caps capsule Commonly known as: FLOMAX Take 1  capsule (0.4 mg total) by mouth daily.        All past medical history, surgical history, allergies, family history, immunizations and medications were updated in the EMR today and reviewed under the history and medication portions of their EMR.      ROS: Negative, with the exception of above mentioned in HPI   Objective:  BP 138/88   Pulse 63   Temp 97.8 F (36.6 C)   Wt 227 lb (103 kg)   SpO2 98%   BMI 29.95 kg/m  Body mass index is 29.95 kg/m. Physical Exam Vitals and nursing note reviewed.  Constitutional:      General: He is not in acute distress.    Appearance: Normal appearance. He is not ill-appearing, toxic-appearing or diaphoretic.  HENT:     Head: Normocephalic and atraumatic.     Right Ear: Tympanic membrane normal.     Left Ear: Tympanic membrane normal.     Nose: No congestion or rhinorrhea.     Mouth/Throat:     Pharynx: Oropharyngeal exudate (left tonsilar region) present. No posterior oropharyngeal erythema.  Eyes:     General: No scleral icterus.       Right eye: No discharge.        Left eye: No discharge.     Extraocular Movements: Extraocular movements intact.     Pupils: Pupils are equal, round, and reactive to light.  Cardiovascular:     Rate and Rhythm: Normal rate and regular rhythm.  Pulmonary:     Effort: Pulmonary effort is normal. No  respiratory distress.     Breath sounds: Normal breath sounds. No wheezing, rhonchi or rales.  Musculoskeletal:     Cervical back: Neck supple. No tenderness.     Right lower leg: No edema.     Left lower leg: No edema.  Lymphadenopathy:     Cervical: No cervical adenopathy.  Skin:    General: Skin is warm.     Findings: No rash.  Neurological:     Mental Status: He is alert and oriented to person, place, and time. Mental status is at baseline.  Psychiatric:        Mood and Affect: Mood normal.        Behavior: Behavior normal.        Thought Content: Thought content normal.        Judgment:  Judgment normal.     Assessment/Plan: Johnathan [name] is a 67 y.o. male present for OV for Hospital discharge follow up with multiple acute concerns History of recent hospitalization Tonsillar abscess/Supraglottic edema He reports he is doing very well since his discharge home.  Eating and drinking without difficulty.  Denies fevers or chills. Continue Augmentin and Medrol Dosepak until completed. - Basic metabolic panel - CBC with Differential/Platelet  Hypocalcemia - PTH, Intact and Calcium - Vitamin D (25 hydroxy)  Elbow pain, unspecified laterality His prior orthopedic was Dr. Everardo Pacific.  He would like referred back to him.  Sleep disturbance With his atrial flutter and cardiac medications would need to be cautious with medication regimen. Would recommend we first try Vistaril nightly.  This was prescribed for him.  Reviewed expectations re: course of current medical issues. Discussed self-management of symptoms. Outlined signs and symptoms indicating need for more acute intervention. Patient verbalized understanding and all questions were answered. Patient received an After-Visit Summary. Any changes in medications were reviewed and patient was provided with updated med list with their AVS.     Orders Placed This Encounter  Procedures   Basic metabolic panel   CBC with Differential/Platelet   PTH, Intact and Calcium   Vitamin D (25 hydroxy)   Ambulatory referral to Orthopedic Surgery     Note is dictated utilizing voice recognition software. Although note has been proof read prior to signing, occasional typographical errors still can be missed. If any questions arise, please do not hesitate to call for verification.   electronically signed by:  Felix Pacini, DO  [facility] - [place]

## 2023-06-05 ENCOUNTER — Telehealth: Payer: Self-pay | Admitting: Cardiovascular Disease

## 2023-06-05 ENCOUNTER — Telehealth: Payer: Self-pay | Admitting: Family Medicine

## 2023-06-05 LAB — CBC WITH DIFFERENTIAL/PLATELET
Absolute Monocytes: 859 cells/uL (ref 200–950)
Basophils Absolute: 61 cells/uL (ref 0–200)
Basophils Relative: 0.5 %
Eosinophils Absolute: 145 cells/uL (ref 15–500)
Eosinophils Relative: 1.2 %
HCT: 49.9 % (ref 38.5–50.0)
Hemoglobin: 16.7 g/dL (ref 13.2–17.1)
Lymphs Abs: 2928 cells/uL (ref 850–3900)
MCH: 29.8 pg (ref 27.0–33.0)
MCHC: 33.5 g/dL (ref 32.0–36.0)
MCV: 89.1 fL (ref 80.0–100.0)
MPV: 10.4 fL (ref 7.5–12.5)
Monocytes Relative: 7.1 %
Neutro Abs: 8107 cells/uL — ABNORMAL HIGH (ref 1500–7800)
Neutrophils Relative %: 67 %
Platelets: 342 10*3/uL (ref 140–400)
RBC: 5.6 10*6/uL (ref 4.20–5.80)
RDW: 13.2 % (ref 11.0–15.0)
Total Lymphocyte: 24.2 %
WBC: 12.1 10*3/uL — ABNORMAL HIGH (ref 3.8–10.8)

## 2023-06-05 LAB — BASIC METABOLIC PANEL
BUN: 18 mg/dL (ref 7–25)
CO2: 24 mmol/L (ref 20–32)
Calcium: 9.7 mg/dL (ref 8.6–10.3)
Chloride: 99 mmol/L (ref 98–110)
Creat: 1.06 mg/dL (ref 0.70–1.35)
Glucose, Bld: 92 mg/dL (ref 65–99)
Potassium: 4.7 mmol/L (ref 3.5–5.3)
Sodium: 138 mmol/L (ref 135–146)

## 2023-06-05 LAB — VITAMIN D 25 HYDROXY (VIT D DEFICIENCY, FRACTURES): Vit D, 25-Hydroxy: 37 ng/mL (ref 30–100)

## 2023-06-05 LAB — PTH, INTACT AND CALCIUM
Calcium: 9.7 mg/dL (ref 8.6–10.3)
PTH: 22 pg/mL (ref 16–77)

## 2023-06-05 MED ORDER — HYDROXYZINE HCL 25 MG PO TABS: 25.0000 mg | ORAL_TABLET | Freq: Every evening | ORAL | 11 refills | Status: DC | PRN

## 2023-06-05 MED ORDER — CEFDINIR 300 MG PO CAPS
300.0000 mg | ORAL_CAPSULE | Freq: Two times a day (BID) | ORAL | 0 refills | Status: DC
Start: 2023-06-05 — End: 2023-06-24

## 2023-06-05 NOTE — Telephone Encounter (Signed)
Spoke with patient regarding results/recommendations.  

## 2023-06-05 NOTE — Telephone Encounter (Signed)
Concurrent use of HYDROXYZINE and QT PROLONGING AGENTS may result in an increased risk of QT interval prolongation.  Do not recommend patient take hydroxyzine. Cefdinir should be fine.

## 2023-06-05 NOTE — Telephone Encounter (Signed)
Please call patient Johnathan [name] still has a mildly elevated white count, it is greatly improved but not back to normal.  I recommend we extend his course of antibiotic for him to be safe, since Johnathan [name] did still have signs of white exudates in his throat on exam and his white blood cells are still elevated.   I did call in Omnicef in place of Augmentin.  Both have great coverage for strep throat, the Omnicef s usually a little easier on the stomach/GI system.  It is every 12 hours and I extended the course to just 7 days longer.   For his concerns about sleep: I would like to first try medication called hydroxyzine.  This is actually in the antihistamine family, but can also be used to help people with sleep disorder.  Since it is an antihistamine family it is safe for him to take with his current medications without worry. Start at 1 tab nightly and can increase to 2 tabs nightly if needed.

## 2023-06-05 NOTE — Telephone Encounter (Signed)
Spoke to the patient, explained PharmD recommendation:  Concurrent use of HYDROXYZINE and QT PROLONGING AGENTS may result in an increased risk of QT interval prolongation.   Do not recommend patient take hydroxyzine. Cefdinir should be fine  Pt stated he is not sleep well and wanted recommendations with sleep aid.Advised pt to contact PCP, pt voiced understanding.

## 2023-06-05 NOTE — Telephone Encounter (Signed)
Spoke to the patient,he is currently taking Tikosyn,  had OV with PCP for tonsillar abscess and was prescribed  cefdinir (OMNICEF) 300 MG capsule  hydrOXYzine (ATARAX) 25 MG tablet    Pt wanted to know it's recommended to take medications these mediations together.Will forward to MD,nurse and pharm D for advise.

## 2023-06-05 NOTE — Telephone Encounter (Signed)
Patient is needing clarification on medication. He is unsure if he should only be taking cefdinir (OMNICEF) 300 MG capsule,  amoxicillin-clavulanate (AUGMENTIN) 875-125 MG tablet or both. Please give the patient a call to inform him on proper dosage.

## 2023-06-05 NOTE — Telephone Encounter (Signed)
Pt c/o medication issue:  1. Name of Medication: dofetilide (TIKOSYN) 500 MCG capsule   cefdinir (OMNICEF) 300 MG capsule  hydrOXYzine (ATARAX) 25 MG tablet   2. How are you currently taking this medication (dosage and times per day)? Has not started taking the last two meds.   3. Are you having a reaction (difficulty breathing--STAT)?   4. What is your medication issue? Patient is calling to make sure these medications prescribed to him today would be okay to take with the RX dofetilide. Please advise.

## 2023-06-11 DIAGNOSIS — G479 Sleep disorder, unspecified: Secondary | ICD-10-CM | POA: Insufficient documentation

## 2023-06-18 ENCOUNTER — Telehealth: Payer: Self-pay | Admitting: Cardiovascular Disease

## 2023-06-18 NOTE — Telephone Encounter (Signed)
Call to patient, LVM to call as patient had med change with no resolution and not seen since [facility] 2023, he should make an appt. To be seen

## 2023-06-18 NOTE — Telephone Encounter (Signed)
Patient is calling wanting to know what medications he can take for sleep with his arrhythmia.   Please advise.

## 2023-06-19 NOTE — Telephone Encounter (Signed)
Patient has set appointment next week.

## 2023-06-23 NOTE — Progress Notes (Unsigned)
  Cardiology Office Note   Date:  06/24/2023  ID:  Johnathan [name], DOB Sep 10, 1956, MRN [id number] PCP:  Johnathan Leatherwood, DO [facility] Cardiologist: Johnathan Batty, MD  Reason for visit: A-fib follow-up  History of Present Illness    Johnathan [name] is a 67 y.o. male with a hx of atrial fibrillation status post A-fib ablation at [facility] with Johnathan [name], several cardioversions following ablation, hyperlipidemia, family history of CAD (brother had MI at 16), OSA.  He last saw Johnathan [name] in March 2023.  He had 1 brief episode of PAF which was self-limited.  He was taking Tikosyn. He was no longer wearing CPAP.  Patient saw his PCP and June and mention he cannot stay asleep well.  Patient states he can fall asleep but then he will wake up in the middle of the night and have issues falling back asleep.  He recounts his history of atrial fibrillation.  He thinks his A-fib ablation was around 2013.  He thinks his last cardioversion was approximately 8 years ago.  He states he has few episodes which are mostly triggered by dehydration.  He has had 3-4 episodes over the past year.  Symptoms typically last 60 to 90 minutes.  He feels palpitations without associated shortness of breath, chest pain or lightheadedness.  His heart rate typically goes to 120.  When this happens, he will take a Xarelto and metoprolol tartrate 25 mg.  If symptoms last more than an hour, he will call the office.  He states typically if symptoms are longer than 24 hours, that is when he has comes in for a cardioversion.  Otherwise, he denies chest pain, shortness of breath, PND, orthopnea, leg swelling, lightheadedness, syncope.  He states he has been diagnosed with sleep apnea before but then was told it was borderline.  He has not used a CPAP in the last 2 years.   Objective / Physical Exam   EKG today: Normal sinus rhythm, left anterior fascicular block, QT 394 ms, QTc 418 ms.  Heart rate 68.  Vital signs:  BP 126/82    Pulse 68   Ht 6\' 1"  (1.854 m)   Wt 235 lb (106.6 kg)   SpO2 99%   BMI 31.00 kg/m     GEN: No acute distress NECK: No carotid bruits CARDIAC: RRR, no murmurs RESPIRATORY:  Clear to auscultation without rales, wheezing or rhonchi  EXTREMITIES: No edema  Assessment and Plan   Atrial fibrillation -A-fib ablation with Johnathan [name] several years ago; several cardioversions -Triggers reported: Alcohol and dehydration -Recommend further evaluation for obstructive sleep apnea -Home sleep study ordered. -His CHA2DS2-VASc score is 1 for his age.  With a low A-fib burden, he only takes Xarelto as needed when he goes into A-fib.  Xarelto 20 mg as needed for palpitations refilled.  Metoprolol tartrate 25 mg as needed for palpitations refill. -I will leave it to Johnathan [name] discretion if he continues Xarelto for A-fib events versus daily maintenance dosing. -Continue Tikosyn.  His QT is in normal range today,  Recent creatinine normal.  Insomnia OSA -Messaged his PCP Johnathan [name] that the following medications are safe to take with Tikosyn: Eszopiclone (Lunesta), Ramelteon, Restoril and Zolpidem (Ambien). -Given information on sleep hygiene. -Ordered sleep study.  Disposition - Follow-up in October as scheduled Johnathan [name] to refill his Tikosyn and for anticoagulation management.   Signed, Johnathan Kettle, PA-C  06/24/2023 [facility]

## 2023-06-24 ENCOUNTER — Telehealth: Payer: Self-pay | Admitting: Cardiovascular Disease

## 2023-06-24 ENCOUNTER — Telehealth: Payer: Self-pay | Admitting: Family Medicine

## 2023-06-24 ENCOUNTER — Encounter: Payer: Self-pay | Admitting: Physician Assistant

## 2023-06-24 ENCOUNTER — Encounter: Payer: Self-pay | Admitting: Cardiovascular Disease

## 2023-06-24 ENCOUNTER — Other Ambulatory Visit: Payer: Self-pay | Admitting: Physician Assistant

## 2023-06-24 ENCOUNTER — Other Ambulatory Visit: Payer: Self-pay | Admitting: *Deleted

## 2023-06-24 ENCOUNTER — Ambulatory Visit: Payer: 59 | Attending: Physician Assistant | Admitting: Physician Assistant

## 2023-06-24 VITALS — BP 126/82 | HR 68 | Ht 73.0 in | Wt 235.0 lb

## 2023-06-24 DIAGNOSIS — G4700 Insomnia, unspecified: Secondary | ICD-10-CM

## 2023-06-24 DIAGNOSIS — I4821 Permanent atrial fibrillation: Secondary | ICD-10-CM | POA: Diagnosis not present

## 2023-06-24 DIAGNOSIS — E782 Mixed hyperlipidemia: Secondary | ICD-10-CM

## 2023-06-24 DIAGNOSIS — G4733 Obstructive sleep apnea (adult) (pediatric): Secondary | ICD-10-CM

## 2023-06-24 MED ORDER — RIVAROXABAN 20 MG PO TABS: ORAL_TABLET | ORAL | 2 refills | Status: DC

## 2023-06-24 MED ORDER — METOPROLOL TARTRATE 25 MG PO TABS: 25.0000 mg | ORAL_TABLET | ORAL | 2 refills | Status: DC | PRN

## 2023-06-24 MED ORDER — RIVAROXABAN 20 MG PO TABS: 20.0000 mg | ORAL_TABLET | ORAL | 2 refills | Status: DC | PRN

## 2023-06-24 NOTE — Patient Instructions (Signed)
Medication Instructions:  No Changes *If you need a refill on your cardiac medications before your next appointment, please call your pharmacy*   Lab Work: No labs If you have labs (blood work) drawn today and your tests are completely normal, you will receive your results only by: MyChart Message (if you have MyChart) OR A paper copy in the mail If you have any lab test that is abnormal or we need to change your treatment, we will call you to review the results.   Testing/Procedures: [facility]. Your physician has recommended that you have a sleep study. This test records several body functions during sleep, including: brain activity, eye movement, oxygen and carbon dioxide blood levels, heart rate and rhythm, breathing rate and rhythm, the flow of air through your mouth and nose, snoring, body muscle movements, and chest and belly movement.    Follow-Up: At [facility], you and your health needs are our priority.  As part of our continuing mission to provide you with exceptional heart care, we have created designated Provider Care Teams.  These Care Teams include your primary Cardiologist (physician) and Advanced Practice Providers (APPs -  Physician Assistants and Nurse Practitioners) who all work together to provide you with the care you need, when you need it.  We recommend signing up for the patient portal called "MyChart".  Sign up information is provided on this After Visit Summary.  MyChart is used to connect with patients for Virtual Visits (Telemedicine).  Patients are able to view lab/test results, encounter notes, upcoming appointments, etc.  Non-urgent messages can be sent to your provider as well.   To learn more about what you can do with MyChart, go to [email or web address].    Your next appointment:   Keep Scheduled Appointment  Provider:   Nanetta Batty, MD    Other Instructions Recommended white noise machine for sleep, can be purchased on  [employer].

## 2023-06-24 NOTE — Telephone Encounter (Signed)
Pt refuses to schedule appt because he came for sleep disturbance on 06/06. Advised pt that he could have a virtual visit but with the list of medication recommendations he would likely need to have an in office appt. Pt is upset that he would need to be seen again for the same thing just to change a medication to a suggested on that would not interact with his medication prescribed by cardiology. Pt refused to schedule appt and asked for PCP to get information so that medication can be changed.  Please advise

## 2023-06-24 NOTE — Telephone Encounter (Signed)
Called pt to let him know per Victorino Dike Lambert's notes she sent a message to her PCP. I have routed office notes from today to pt's PCP. Message also sent to pt with the names of the medications via MyChart. He was thankful for the message being sent.

## 2023-06-24 NOTE — Telephone Encounter (Signed)
Patient called and states that his cardiologist (Dr. Allyson Sabal) is suppose to send over a list of  sleep medications that are approved to take with TikoSyn.

## 2023-06-24 NOTE — Telephone Encounter (Signed)
Patient has called back several times today to get a medication sent in for sleeping.  I have spoken with him and have explained the policy and how we have been waiting on the list from Dr. Hazle Coca office. The approved list of medications is  Eszopiclone (Lunesta), Ramelteon, Restoril and Zolpidem (Ambien).  Please give Johnathan [name] a call tomorrow to explain what has occurred because he was very rude and disrespectful. He thinks his medication should be called in right away and said I was basically the Dr. Because he can't speak with her and I explained to him I was not a physician and I can not put a time frame on when she will get back to him in regards to the medication and that they have 24-28 hours to handle medication request.  He also said if his Doctor can't help him he will have to find another one.

## 2023-06-24 NOTE — Addendum Note (Signed)
Addended by: Memory Dance on: 06/24/2023 11:08 AM   Modules accepted: Orders

## 2023-06-24 NOTE — Addendum Note (Signed)
Addended by: Memory Dance on: 06/24/2023 11:06 AM   Modules accepted: Orders

## 2023-06-24 NOTE — Telephone Encounter (Signed)
Pt c/o medication issue:  1. Name of Medication:   Recommended sleep medication  2. How are you currently taking this medication (dosage and times per day)?   3. Are you having a reaction (difficulty breathing--STAT)?   4. What is your medication issue?    Patient stated at today's visit recommended names of sleep medication he can take was to be forwarded to his PCP, Dr. Claiborne Billings, fax# [phone], ph# [phone].  Patient is following-up to have recommendations forwarded.

## 2023-06-24 NOTE — Telephone Encounter (Signed)
Pt calling back to f/u up on sleep medications being sent over. Please advise.

## 2023-06-24 NOTE — Telephone Encounter (Signed)
Prescription refill request for Xarelto received.  Indication: Afib  Last office visit: 06/23/25 Juanda Crumble)  Weight: 106.6kg Age: 67 Scr: 1.06 (06/04/23)  CrCl: 101.26ml/min  Per office visit today, 06/24/23: He feels palpitations without associated shortness of breath, chest pain or lightheadedness. His heart rate typically goes to 120. When this happens, he will take a Xarelto and metoprolol tartrate 25 mg. If symptoms last more than an hour, he will call the office.

## 2023-06-24 NOTE — Telephone Encounter (Signed)
Error

## 2023-06-25 NOTE — Telephone Encounter (Signed)
Called and informed pt of the need to make an appointment for this issue. Pt was very upset over the fact that he had to make an appointment. Pt stated he was very disappointed about the situation. Pt then hung up the phone before I could confirm the scheduled virtual for 7/2.

## 2023-06-25 NOTE — Telephone Encounter (Signed)
Patient is scheduled for 7/2 at 2:20 for a virtual visit if appointment is necessary.

## 2023-06-25 NOTE — Telephone Encounter (Signed)
Patient called back and confirmed virtual appt that was made earlier today by [place].  Virtual visit confirmed for 7/2 at 2:20PM with Dr. Claiborne Billings for med change

## 2023-06-25 NOTE — Telephone Encounter (Signed)
His disappointment is noted.  Recommendations remain the same.

## 2023-06-25 NOTE — Telephone Encounter (Signed)
Please inform patient an appointment is needed if he is asking for a different medication than what has been prescribed.  Not only is this by law, for some of the medications that are on the list provided, since they are considered controlled substances it is also for safety and documentation reasons.     If he feels he would be better served by another provider, we will forward his records to them.

## 2023-06-30 ENCOUNTER — Telehealth (INDEPENDENT_AMBULATORY_CARE_PROVIDER_SITE_OTHER): Payer: 59 | Admitting: Family Medicine

## 2023-06-30 ENCOUNTER — Encounter: Payer: Self-pay | Admitting: Family Medicine

## 2023-06-30 DIAGNOSIS — G479 Sleep disorder, unspecified: Secondary | ICD-10-CM | POA: Diagnosis not present

## 2023-06-30 DIAGNOSIS — N401 Enlarged prostate with lower urinary tract symptoms: Secondary | ICD-10-CM | POA: Diagnosis not present

## 2023-06-30 DIAGNOSIS — R351 Nocturia: Secondary | ICD-10-CM | POA: Diagnosis not present

## 2023-06-30 MED ORDER — TAMSULOSIN HCL 0.4 MG PO CAPS: 0.4000 mg | ORAL_CAPSULE | Freq: Two times a day (BID) | ORAL | 3 refills | Status: DC

## 2023-06-30 MED ORDER — ZOLPIDEM TARTRATE 10 MG PO TABS: 10.0000 mg | ORAL_TABLET | Freq: Every evening | ORAL | 1 refills | Status: DC | PRN

## 2023-06-30 NOTE — Progress Notes (Signed)
VIRTUAL VISIT VIA VIDEO  I connected with Johnathan [name] on 06/30/23 at  2:20 PM EDT by a video enabled telemedicine application and verified that I am speaking with the correct person using two identifiers. Location patient: Home Location provider: [facility], Office Persons participating in the virtual visit: Patient, Dr. Claiborne Billings and Ivonne Andrew, CMA  I discussed the limitations of evaluation and management by telemedicine and the availability of in person appointments. The patient expressed understanding and agreed to proceed.   Johnathan [name] , November 05, 1956, 67 y.o., male MRN: [id number] Patient Care Team    Relationship Specialty Notifications Start End  Natalia Leatherwood, DO PCP - General Family Medicine  04/15/17   Runell Gess, MD PCP - Cardiology Cardiology  12/21/19   Sim Boast PCP - Pulmonology Family Medicine  12/21/19   Runell Gess, MD Consulting Physician Cardiology  03/22/18   Hilarie Fredrickson, MD Consulting Physician Gastroenterology  07/19/19   Lenda Kelp, MD Consulting Physician Sports Medicine  03/07/20   Bjorn Pippin, MD Consulting Physician Orthopedic Surgery  03/07/20   Lennette Bihari, MD Consulting Physician Cardiology  04/02/20     Chief Complaint  Patient presents with   Insomnia     Subjective: Johnathan [name] is a 67 y.o. Pt presents for an OV to further discuss his sleep disturbance. Discussion initiated at his hospital follow up appt for his recent hosp admission d/t an infection. As a courtesy, this topic was additionally covered, despite it being outside the scope of the hospital follow up appt.  Patient's cardiologist/pharmacist did not advise the use of hydroxyzine low-dose to help with sleep.  Secondary to potential QTc prolongation.  This medication is not contraindicated to be used with Tikosyn, but there is need to use with caution d/t qtc prolongation potential with use. Of note, his QTc is of normal range.  Lexapro is also on the  list of monitor with use of Tikosyn.  Patient has been prescribed  Lexapro prior to his tykosin start, he is on a very low-dose.  His QTc has been normal. cardiology advised that the following medications could be used to help with sleep, all of which are  controlled substances and not typically first-line therapy for sleeping disturbances:Lunesta, Rozerem, Restoril and Ambien.   Patient also states he does not feel the Flomax 0.4 mg is helping with his urination pattern.  He is still having rather significant nocturia.  Cardiology is arranging a sleep study for him.    06/04/2023    9:40 AM 01/26/2023    8:51 AM 08/01/2022    4:05 PM 01/09/2022    8:45 AM 05/29/2021    7:59 AM  Depression screen PHQ 2/9  Decreased Interest 0 0 0 0 0  Down, Depressed, Hopeless 0 0 0 0 0  PHQ - 2 Score 0 0 0 0 0  Altered sleeping   1 1   Tired, decreased energy   0 0   Change in appetite   0 0   Feeling bad or failure about yourself    0 0   Trouble concentrating   0 0   Moving slowly or fidgety/restless   0 0   Suicidal thoughts   0 0   PHQ-9 Score   1 1     Allergies  Allergen Reactions   Sulfa Antibiotics     Unknown reaction in childhood   Social History   Social History  Narrative   Married to [place]. 2 children [place].    BS degree.    Wear seatbelt. Wears a bicycle helmet. Smoke detector in the home.   Firearms in the home.   Feels safe in  Relationships.   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, fashion/clothing   L[facility] in [facility]   Play professional AAA baseball previously   Past Medical History:  Diagnosis Date   Arthritis    hands and feet   Cutaneous skin tags 01/09/2022   Dysrhythmia    a-fib   Elevated LFTs    Hearing aid worn 2021   right   Hyperlipidemia    Persistent atrial fibrillation (HCC)    RLS (restless legs syndrome)    Sleep apnea    does not use CPAP   Past Surgical History:  Procedure Laterality Date   ATRIAL FIBRILLATION ABLATION  2015    BONE EXCISION Right 05/09/2021   Procedure: RIGHT TIBIA EXCISION;  Surgeon: Bjorn Pippin, MD;  Location: [facility];  Service: Orthopedics;  Laterality: Right;   COLONOSCOPY     EXCISION MORTON'S NEUROMA Right 05/09/2021   Procedure: EXCISION OF RIGHT SECOND TOE MASS;  Surgeon: Terance Hart, MD;  Location: [facility];  Service: Orthopedics;  Laterality: Right;   FOOT SURGERY Left    HERNIA REPAIR     LUMBAR DISC SURGERY  2012   L3-L4, No hardware   Family History  Problem Relation Age of Onset   Diabetes Mother    Diabetes Brother    Early death Brother    Heart disease Brother    Colon cancer Neg Hx    Esophageal cancer Neg Hx    Rectal cancer Neg Hx    Stomach cancer Neg Hx    Liver disease Neg Hx    Pancreatic cancer Neg Hx    Colon polyps Neg Hx    Allergies as of 06/30/2023       Reactions   Sulfa Antibiotics    Unknown reaction in childhood        Medication List        Accurate as of June 30, 2023  2:48 PM. If you have any questions, ask your nurse or doctor.          allopurinol 100 MG tablet Commonly known as: ZYLOPRIM Take 2 tablets (200 mg total) by mouth daily.   atorvastatin 40 MG tablet Commonly known as: LIPITOR Take 1 tablet (40 mg total) by mouth daily.   colchicine 0.6 MG tablet 1 tab po with onset of gout, repeat 1 tab po in 3 hours.Then 1 tab po daily until resolved.   dofetilide 500 MCG capsule Commonly known as: TIKOSYN Take 1 capsule (500 mcg total) by mouth 2 (two) times daily. NEED OV.   escitalopram 5 MG tablet Commonly known as: LEXAPRO Take 1 tablet (5 mg total) by mouth daily.   metoprolol tartrate 25 MG tablet Commonly known as: LOPRESSOR Take 1 tablet (25 mg total) by mouth as needed (As needed for palpitations).   rivaroxaban 20 MG Tabs tablet Commonly known as: Xarelto Take 1 tablet daily - as needed for palpitations   tamsulosin 0.4 MG Caps capsule Commonly known as: FLOMAX Take  1 capsule (0.4 mg total) by mouth 2 (two) times daily. What changed: when to take this Changed by: Felix Pacini, DO   zolpidem 10 MG tablet Commonly known as: AMBIEN Take 1 tablet (10  mg total) by mouth at bedtime as needed for sleep. Started by: Felix Pacini, DO        All past medical history, surgical history, allergies, family history, immunizations andmedications were updated in the EMR today and reviewed under the history and medication portions of their EMR.     ROS Negative, with the exception of above mentioned in HPI   Objective:  There were no vitals taken for this visit. There is no height or weight on file to calculate BMI. Physical Exam Vitals and nursing note reviewed.  Constitutional:      General: He is not in acute distress.    Appearance: Normal appearance. He is not toxic-appearing.  HENT:     Head: Normocephalic and atraumatic.  Eyes:     General: No scleral icterus.       Right eye: No discharge.        Left eye: No discharge.     Conjunctiva/sclera: Conjunctivae normal.  Pulmonary:     Effort: Pulmonary effort is normal.  Musculoskeletal:     Cervical back: Normal range of motion.  Skin:    Findings: No rash.  Neurological:     Mental Status: He is alert and oriented to person, place, and time. Mental status is at baseline.  Psychiatric:        Mood and Affect: Mood normal.        Behavior: Behavior normal.        Thought Content: Thought content normal.        Judgment: Judgment normal.     No results found. No results found. No results found for this or any previous visit (from the past 24 hour(s)).  Assessment/Plan: Tasha Bussiere is a 67 y.o. male present for OV for  Benign prostatic hyperplasia with nocturia Increase Flomax to 0.4 mg twice daily If patient does not see improvement within 4-6 weeks, could refer to urology for further evaluation.  Sleep disturbance -[place] controlled substance database reviewed and  appropriate -Patient was counseled on need of every 5 and half month appointment face-to-face if requiring refill on a controlled substance.  This is the law, and nonnegotiable. -I felt it was necessary to clarify with patient today that the medication (Vistaril) was not the "wrong " medication, as stated during a phone call to staff.  Vistaril low-dose is not contraindicated with use of Tikosyn.  It is however on the "use with caution "list with use of Tikosyn, as is many medications including one medication he has been taking since prior to tikosyn start.  Patient reported understanding. -Ambien 5-10 mg nightly as needed prescribed with instructions on proper use.   Reviewed expectations re: course of current medical issues. Discussed self-management of symptoms. Outlined signs and symptoms indicating need for more acute intervention. Patient verbalized understanding and all questions were answered. Patient received an After-Visit Summary.    No orders of the defined types were placed in this encounter.  Meds ordered this encounter  Medications   zolpidem (AMBIEN) 10 MG tablet    Sig: Take 1 tablet (10 mg total) by mouth at bedtime as needed for sleep.    Dispense:  90 tablet    Refill:  1   tamsulosin (FLOMAX) 0.4 MG CAPS capsule    Sig: Take 1 capsule (0.4 mg total) by mouth 2 (two) times daily.    Dispense:  180 capsule    Refill:  3   Referral Orders  No referral(s) requested today  Note is dictated utilizing voice recognition software. Although note has been proof read prior to signing, occasional typographical errors still can be missed. If any questions arise, please do not hesitate to call for verification.   electronically signed by:  Howard Pouch, DO  [place]

## 2023-06-30 NOTE — Patient Instructions (Signed)
Return in about 24 weeks (around 12/15/2023), or if symptoms worsen or fail to improve.        Great to see you today.  I have refilled the medication(s) we provide.   If labs were collected, we will inform you of lab results once received either by echart message or telephone call.   - echart message- for normal results that have been seen by the patient already.   - telephone call: abnormal results or if patient has not viewed results in their echart.

## 2023-07-03 ENCOUNTER — Telehealth: Payer: Self-pay | Admitting: *Deleted

## 2023-07-03 ENCOUNTER — Telehealth: Payer: 59 | Admitting: Nurse Practitioner

## 2023-07-03 ENCOUNTER — Encounter: Payer: Self-pay | Admitting: Nurse Practitioner

## 2023-07-03 DIAGNOSIS — M109 Gout, unspecified: Secondary | ICD-10-CM

## 2023-07-03 MED ORDER — PREDNISONE 20 MG PO TABS
20.0000 mg | ORAL_TABLET | Freq: Two times a day (BID) | ORAL | 0 refills | Status: DC
Start: 2023-07-03 — End: 2023-07-08

## 2023-07-03 NOTE — Telephone Encounter (Signed)
   Name: Johnathan [name]  DOB: May 27, 1956  MRN: [id number]  Primary Cardiologist: Nanetta Batty, MD  Chart reviewed as part of pre-operative protocol coverage. Because of Johnathan [name]'s past medical history and time since last visit, he will require a follow-up in-office visit in order to better assess preoperative cardiovascular risk.  Pre-op covering staff: - Please schedule appointment and call patient to inform them. If patient already had an upcoming appointment within acceptable timeframe, please add "pre-op clearance" to the appointment notes so provider is aware. - Please contact requesting surgeon's office via preferred method (i.e, phone, fax) to inform them of need for appointment prior to surgery.  Per Dr. Hazle Coca last note, 03/18/2022, patient is not on anticoagulation.   Carlos Levering, NP  07/03/2023, 12:45 PM

## 2023-07-03 NOTE — Progress Notes (Signed)
E-Visit for Gout Symptoms  We are sorry that you are not feeling well. We are here to help!  Based on what you shared with me it looks like you have a flare of your gout.  Gout is a form of arthritis. It can cause pain and swelling in the joints. At first, it tends to affect only 1 joint - most frequently the big toe. It happens in people who have too much uric acid in the blood. Uric acid is a chemical that is produced when the body breaks down certain foods. Uric acid can form sharp needle-like crystals that build up in the joints and cause pain. Uric acid crystals can also form inside the tubes that carry urine from the kidneys to the bladder. These crystals can turn into "kidney stones" that can cause pain and problems with the flow of urine. People with gout get sudden "flares" or attacks of severe pain, most often the big toe, ankle, or knee. Often the joint also turns red and swells. Usually, only 1 joint is affected, but some people have pain in more than 1 joint. Gout flares tend to happen more often during the night.  The pain from gout can be extreme. The pain and swelling are worst at the beginning of a gout flare. The symptoms then get better within a few days to weeks. It is not clear how the body "turns off" a gout flare.  Do not start any NEW preventative medicine until the gout has cleared completely. However, If you are already on Probenecid or Allopurinol for CHRONIC gout, you may continue taking this during an active flare up  I have prescribed Prednisone 40 mg daily for 7   HOME CARE Losing weight can help relieve gout. It's not clear that following a specific diet plan will help with gout symptoms but eating a balanced diet can help improve your overall health. It can also help you lose weight, if you are overweight. In general, a healthy diet includes plenty of fruits, vegetables, whole grains, and low-fat dairy products (labelled "low fat", skim, 2%). Avoid sugar sweetened  drinks (including sodas, tea, juice and juice blends, coffee drinks and sports drinks) Limit alcohol to 1-2 drinks of beer, spirits or wine daily these can make gout flares worse. Some people with gout also have other health problems, such as heart disease, high blood pressure, kidney disease, or obesity. If you have any of these issues, it's important to work with your doctor to manage them. This can help improve your overall health and might also help with your gout.  GET HELP RIGHT AWAY IF: Your symptoms persist after you have completed your treatment plan You develop severe diarrhea You develop abnormal sensations  You develop vomiting,   You develop weakness  You develop abdominal pain  FOLLOW UP WITH YOUR PRIMARY PROVIDER IF: If your symptoms do not improve within 10 days  MAKE SURE YOU  Understand these instructions. Will watch your condition. Will get help right away if you are not doing well or get worse.  Thank you for choosing an e-visit.  Your e-visit answers were reviewed by a board certified advanced clinical practitioner to complete your personal care plan. Depending upon the condition, your plan could have included both over the counter or prescription medications.  Please review your pharmacy choice. Make sure the pharmacy is open so you can pick up prescription now. If there is a problem, you may contact your provider through [employer] and have  the prescription routed to another pharmacy.  Your safety is important to [place]. If you have drug allergies check your prescription carefully.   For the next 24 hours you can use MyChart to ask questions about today's visit, request a non-urgent call back, or ask for a work or school excuse. You will get an email in the next two days asking about your experience. I hope that your e-visit has been valuable and will speed your recovery.   Meds ordered this encounter  Medications   predniSONE (DELTASONE) 20 MG tablet    Sig:  Take 1 tablet (20 mg total) by mouth 2 (two) times daily with a meal for 7 days.    Dispense:  14 tablet    Refill:  0    I spent approximately 5 minutes reviewing the patient's history, current symptoms and coordinating their care today.

## 2023-07-03 NOTE — Telephone Encounter (Signed)
I had sent a secure chat to surgery scheduler Sherri G today in regard to if pt state to her if he was taking Xarelto. Per surgery scheduler Sherri, pt told he that he is no longer taking Xarelto. I thanked Sherri for her help.

## 2023-07-03 NOTE — Progress Notes (Unsigned)
Cardiology Clinic Note   Patient Name: Johnathan [name] Date of Encounter: 07/03/2023  Primary Care Provider:  Natalia Leatherwood, DO Primary Cardiologist:  Nanetta Batty, MD  Patient Profile    67 year old male with history of permanent atrial fibrillation no longer on anticoagulation, he had A-fib ablation at [facility] 9 years ago, he is on Tikosyn.  Most recent ablation in 2019 at [facility] by Dr. Garald Balding.  Other history includes hyperlipidemia, OSA on CPAP (not wearing CPAP on last office visit 03/18/2022).   Past Medical History    Past Medical History:  Diagnosis Date   Arthritis    hands and feet   Cutaneous skin tags 01/09/2022   Dysrhythmia    a-fib   Elevated LFTs    Hearing aid worn 2021   right   Hyperlipidemia    Persistent atrial fibrillation (HCC)    RLS (restless legs syndrome)    Sleep apnea    does not use CPAP   Past Surgical History:  Procedure Laterality Date   ATRIAL FIBRILLATION ABLATION  2015   BONE EXCISION Right 05/09/2021   Procedure: RIGHT TIBIA EXCISION;  Surgeon: Bjorn Pippin, MD;  Location: [facility];  Service: Orthopedics;  Laterality: Right;   COLONOSCOPY     EXCISION MORTON'S NEUROMA Right 05/09/2021   Procedure: EXCISION OF RIGHT SECOND TOE MASS;  Surgeon: Terance Hart, MD;  Location: [facility];  Service: Orthopedics;  Laterality: Right;   FOOT SURGERY Left    HERNIA REPAIR     LUMBAR DISC SURGERY  2012   L3-L4, No hardware    Allergies  Allergies  Allergen Reactions   Sulfa Antibiotics     Unknown reaction in childhood    History of Present Illness    Johnathan [name] is here today for cardiac evaluation prior to having left elbow olecranon bursectomy, tenotomy, and excision of a mass.  This will be completed by Dr. Ramond Marrow of [facility] on date to be determined.  Home Medications    Current Outpatient Medications  Medication Sig Dispense Refill   allopurinol  (ZYLOPRIM) 100 MG tablet Take 2 tablets (200 mg total) by mouth daily. 180 tablet 3   atorvastatin (LIPITOR) 40 MG tablet Take 1 tablet (40 mg total) by mouth daily. 90 tablet 3   colchicine 0.6 MG tablet 1 tab po with onset of gout, repeat 1 tab po in 3 hours.Then 1 tab po daily until resolved. 30 tablet 11   dofetilide (TIKOSYN) 500 MCG capsule Take 1 capsule (500 mcg total) by mouth 2 (two) times daily. NEED OV. 180 capsule 0   escitalopram (LEXAPRO) 5 MG tablet Take 1 tablet (5 mg total) by mouth daily. 90 tablet 3   metoprolol tartrate (LOPRESSOR) 25 MG tablet Take 1 tablet (25 mg total) by mouth as needed (As needed for palpitations). 30 tablet 2   predniSONE (DELTASONE) 20 MG tablet Take 1 tablet (20 mg total) by mouth 2 (two) times daily with a meal for 7 days. 14 tablet 0   rivaroxaban (XARELTO) 20 MG TABS tablet Take 1 tablet daily - as needed for palpitations (Patient not taking: Reported on 06/30/2023) 30 tablet 2   tamsulosin (FLOMAX) 0.4 MG CAPS capsule Take 1 capsule (0.4 mg total) by mouth 2 (two) times daily. 180 capsule 3   zolpidem (AMBIEN) 10 MG tablet Take 1 tablet (10 mg total) by mouth at bedtime as needed for sleep. 90 tablet 1  No current facility-administered medications for this visit.     Family History    Family History  Problem Relation Age of Onset   Diabetes Mother    Diabetes Brother    Early death Brother    Heart disease Brother    Colon cancer Neg Hx    Esophageal cancer Neg Hx    Rectal cancer Neg Hx    Stomach cancer Neg Hx    Liver disease Neg Hx    Pancreatic cancer Neg Hx    Colon polyps Neg Hx    He indicated that his mother is alive. He indicated that his father is alive. He indicated that his sister is alive. He indicated that his brother is deceased. He indicated that his maternal grandmother is deceased. He indicated that his maternal grandfather is deceased. He indicated that his paternal grandmother is deceased. He indicated that his  paternal grandfather is deceased. He indicated that his daughter is alive. He indicated that his son is alive. He indicated that his maternal aunt is alive. He indicated that his maternal uncle is alive. He indicated that his paternal aunt is deceased. He indicated that his paternal uncle is deceased. He indicated that the status of his neg hx is unknown.  Social History    Social History   Socioeconomic History   Marital status: Married    Spouse name: [name]   Number of children: 2   Years of education: 16   Highest education level: Not on file  Occupational History   Not on file  Tobacco Use   Smoking status: Never   Smokeless tobacco: Former    Types: Associate Professor Use: Never used  Substance and Sexual Activity   Alcohol use: Yes    Alcohol/week: 10.0 standard drinks of alcohol    Types: 10 Cans of beer per week    Comment: social   Drug use: No   Sexual activity: Yes    Partners: Female    Comment: Married  Other Topics Concern   Not on file  Social History Narrative   Married to [place]. 2 children [place].    BS degree.    Wear seatbelt. Wears a bicycle helmet. Smoke detector in the home.   Firearms in the home.   Feels safe in  Relationships.   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  Cardiovascular:  No chest pain, dyspnea on exertion, edema, orthopnea, palpitations, paroxysmal nocturnal dyspnea. Dermatological: No rash, lesions/masses Respiratory: No cough, dyspnea Urologic: No hematuria,  dysuria Abdominal:   No nausea, vomiting, diarrhea, bright red blood per rectum, melena, or hematemesis Neurologic:  No visual changes, wkns, changes in mental status. All other systems reviewed and are otherwise negative except as noted above.       Physical Exam    VS:  There were no vitals taken for this visit. , BMI There is no height or weight on file to calculate BMI.  GEN: Well nourished, well developed, in no acute distress. HEENT: normal. Neck: Supple, no JVD, carotid bruits, or masses. Cardiac: RRR, no murmurs, rubs, or gallops. No clubbing, cyanosis, edema.  Radials/DP/PT 2+ and equal bilaterally.  Respiratory:  Respirations regular and unlabored, clear to auscultation bilaterally. GI: Soft, nontender, nondistended, BS + x 4. MS: no deformity or atrophy. Skin: warm and dry, no rash. Neuro:  Strength and sensation are intact. Psych: Normal affect.      Lab Results  Component Value Date   WBC 12.1 (H) 06/04/2023   HGB 16.7 06/04/2023   HCT 49.9 06/04/2023   MCV 89.1 06/04/2023   PLT 342 06/04/2023   Lab Results  Component Value Date   CREATININE 1.06 06/04/2023   BUN 18 06/04/2023   NA 138 06/04/2023   K 4.7 06/04/2023   CL 99 06/04/2023   CO2 24 06/04/2023   Lab Results  Component Value Date   ALT 18 01/19/2023   AST 18 01/19/2023   ALKPHOS 88 01/19/2023   BILITOT 1.3 (H) 01/19/2023   Lab Results  Component Value Date   CHOL 176 01/19/2023   HDL 56.50 01/19/2023   LDLCALC 104 (H) 01/19/2023   TRIG 77.0 01/19/2023   CHOLHDL 3 01/19/2023    Lab Results  Component Value Date   HGBA1C 6.1 01/19/2023     Review of Prior Studies      Assessment & Plan   1.  ***     {Are you ordering a CV Procedure (e.g. stress test, cath, DCCV, TEE, etc)?   Press F2        :[id number]}   Signed, Bettey Mare. Liborio Nixon, ANP, AACC   07/03/2023 4:49 PM      Office [phone] Fax [phone]  Notice: This dictation was prepared with Dragon  dictation along with smaller phrase technology. Any transcriptional errors that result from this process are unintentional and may not be corrected upon review.

## 2023-07-03 NOTE — Telephone Encounter (Signed)
ERROR IN OFFICE APPOINTMENT

## 2023-07-03 NOTE — Progress Notes (Signed)
Virtual Visit Consent   Johnathan [name], you are scheduled for a virtual visit with a [facility] provider today. Just as with appointments in the office, your consent must be obtained to participate. Your consent will be active for this visit and any virtual visit you may have with one of our providers in the next 365 days. If you have a MyChart account, a copy of this consent can be sent to you electronically.  As this is a virtual visit, video technology does not allow for your provider to perform a traditional examination. This may limit your provider's ability to fully assess your condition. If your provider identifies any concerns that need to be evaluated in person or the need to arrange testing (such as labs, EKG, etc.), we will make arrangements to do so. Although advances in technology are sophisticated, we cannot ensure that it will always work on either your end or our end. If the connection with a video visit is poor, the visit may have to be switched to a telephone visit. With either a video or telephone visit, we are not always able to ensure that we have a secure connection.  By engaging in this virtual visit, you consent to the provision of healthcare and authorize for your insurance to be billed (if applicable) for the services provided during this visit. Depending on your insurance coverage, you may receive a charge related to this service.  I need to obtain your verbal consent now. Are you willing to proceed with your visit today? Johnathan [name] has provided verbal consent on 07/03/2023 for a virtual visit (video or telephone). Viviano Simas, FNP  Date: 07/03/2023 5:19 PM  Virtual Visit via Video Note   I, Viviano Simas, connected with  Johnathan [name]  ([id number], June 30, 1956) on 07/03/23 at  5:30 PM EDT by a video-enabled telemedicine application and verified that I am speaking with the correct person using two identifiers.  Location: Patient: Virtual Visit Location Patient: Home Provider:  Virtual Visit Location Provider: Home Office   I discussed the limitations of evaluation and management by telemedicine and the availability of in person appointments. The patient expressed understanding and agreed to proceed.    History of Present Illness: Johnathan [name] is a 67 y.o. who identifies as a male who was assigned male at birth, and is being seen today for GOUT management  Gout on left foot  Discussed with PCP three days ago and discussed    He takes allopurinol daily  Also uses colchicine daily as well and has been using that for the past 3 days   Did submit a prior E-visit today and was given a prednisone Rx but was unaware that was sent   Problems:  Patient Active Problem List   Diagnosis Date Noted   Sleep disturbance 06/11/2023   Supraglottic edema 05/29/2023   Tonsillar abscess 05/29/2023   Sullivan Lone disease 10/20/2019   Tinnitus aurium, right 03/29/2019   BPH associated with nocturia 12/20/2018   OSA on CPAP 03/22/2018   Primary osteoarthritis of first carpometacarpal joint of right hand 05/07/2017   Permanent atrial fibrillation (HCC) 04/15/2017   Chronic pain of right thumb 04/15/2017   Anxiety 04/15/2017   Long-term use of high-risk medication 04/15/2017   Hyperlipidemia    Atypical atrial flutter (HCC) 09/10/2015    Allergies:  Allergies  Allergen Reactions   Sulfa Antibiotics     Unknown reaction in childhood   Medications:  Current Outpatient Medications:    allopurinol (ZYLOPRIM) 100 MG tablet,  Take 2 tablets (200 mg total) by mouth daily., Disp: 180 tablet, Rfl: 3   atorvastatin (LIPITOR) 40 MG tablet, Take 1 tablet (40 mg total) by mouth daily., Disp: 90 tablet, Rfl: 3   colchicine 0.6 MG tablet, 1 tab po with onset of gout, repeat 1 tab po in 3 hours.Then 1 tab po daily until resolved., Disp: 30 tablet, Rfl: 11   dofetilide (TIKOSYN) 500 MCG capsule, Take 1 capsule (500 mcg total) by mouth 2 (two) times daily. NEED OV., Disp: 180 capsule, Rfl: 0    escitalopram (LEXAPRO) 5 MG tablet, Take 1 tablet (5 mg total) by mouth daily., Disp: 90 tablet, Rfl: 3   metoprolol tartrate (LOPRESSOR) 25 MG tablet, Take 1 tablet (25 mg total) by mouth as needed (As needed for palpitations)., Disp: 30 tablet, Rfl: 2   predniSONE (DELTASONE) 20 MG tablet, Take 1 tablet (20 mg total) by mouth 2 (two) times daily with a meal for 7 days., Disp: 14 tablet, Rfl: 0   rivaroxaban (XARELTO) 20 MG TABS tablet, Take 1 tablet daily - as needed for palpitations (Patient not taking: Reported on 06/30/2023), Disp: 30 tablet, Rfl: 2   tamsulosin (FLOMAX) 0.4 MG CAPS capsule, Take 1 capsule (0.4 mg total) by mouth 2 (two) times daily., Disp: 180 capsule, Rfl: 3   zolpidem (AMBIEN) 10 MG tablet, Take 1 tablet (10 mg total) by mouth at bedtime as needed for sleep., Disp: 90 tablet, Rfl: 1  Observations/Objective: Patient is well-developed, well-nourished in no acute distress.  Resting comfortably  at home.  Head is normocephalic, atraumatic.  No labored breathing.  Speech is clear and coherent with logical content.  Patient is alert and oriented at baseline.    Assessment and Plan:  1. Acute gout of left foot, unspecified cause  Rx previously sent in through E-visit  Duplicate visit - no charge        Follow Up Instructions: I discussed the assessment and treatment plan with the patient. The patient was provided an opportunity to ask questions and all were answered. The patient agreed with the plan and demonstrated an understanding of the instructions.  A copy of instructions were sent to the patient via MyChart unless otherwise noted below.    The patient was advised to call back or seek an in-person evaluation if the symptoms worsen or if the condition fails to improve as anticipated.  Time:  I spent 4 minutes with the patient via telehealth technology discussing the above problems/concerns.    Viviano Simas, FNP

## 2023-07-03 NOTE — Telephone Encounter (Signed)
   Pre-operative Risk Assessment    Patient Name: Johnathan [name]  DOB: 12-Jun-1956 MRN: [id number]      Request for Surgical Clearance    Procedure:   LEFT ELBOW OLECRANON BURSECTOMY, TENOTOMY, EXCISION OF MASS  Date of Surgery:  Clearance TBD                                 Surgeon:  DR. Ramond Marrow Surgeon's Group or Practice Name:  [facility] Phone number:  [phone] EXT [phone] ATTN: Silvestre Mesi Fax number:  [phone]   Type of Clearance Requested:   - Medical  - Pharmacy:  Hold Rivaroxaban (Xarelto) (CLEARANCE DOES NOT REQUEST TO BE HELD, ALSO THERE IS A NOTE ON MED LIST STATES THE PT IS NOT TAKING)   Type of Anesthesia:   CHOICE   Additional requests/questions:    Johnathan [name]   07/03/2023, 9:30 AM

## 2023-07-07 MED ORDER — METOPROLOL TARTRATE 25 MG PO TABS: 25.0000 mg | ORAL_TABLET | ORAL | 2 refills | Status: DC | PRN

## 2023-07-08 ENCOUNTER — Ambulatory Visit: Payer: 59 | Attending: Adult Health | Admitting: Adult Health

## 2023-07-08 ENCOUNTER — Encounter: Payer: Self-pay | Admitting: Adult Health

## 2023-07-08 VITALS — BP 140/80 | HR 69 | Ht 73.0 in | Wt 232.4 lb

## 2023-07-08 DIAGNOSIS — I4821 Permanent atrial fibrillation: Secondary | ICD-10-CM

## 2023-07-08 DIAGNOSIS — I484 Atypical atrial flutter: Secondary | ICD-10-CM | POA: Diagnosis not present

## 2023-07-08 NOTE — Patient Instructions (Signed)
Medication Instructions:  No Changes *If you need a refill on your cardiac medications before your next appointment, please call your pharmacy*   Lab Work: No Labs If you have labs (blood work) drawn today and your tests are completely normal, you will receive your results only by: MyChart Message (if you have MyChart) OR A paper copy in the mail If you have any lab test that is abnormal or we need to change your treatment, we will call you to review the results.   Testing/Procedures: No Testing   Follow-Up: At [facility], you and your health needs are our priority.  As part of our continuing mission to provide you with exceptional heart care, we have created designated Provider Care Teams.  These Care Teams include your primary Cardiologist (physician) and Advanced Practice Providers (APPs -  Physician Assistants and Nurse Practitioners) who all work together to provide you with the care you need, when you need it.  We recommend signing up for the patient portal called "MyChart".  Sign up information is provided on this After Visit Summary.  MyChart is used to connect with patients for Virtual Visits (Telemedicine).  Patients are able to view lab/test results, encounter notes, upcoming appointments, etc.  Non-urgent messages can be sent to your provider as well.   To learn more about what you can do with MyChart, go to [email or web address].    Your next appointment:   Keep Scheduled Appointment  Provider:   Jonathan Berry, MD   

## 2023-07-14 ENCOUNTER — Telehealth: Payer: Self-pay | Admitting: Cardiovascular Disease

## 2023-07-14 NOTE — Telephone Encounter (Signed)
Pt is following up on needing to be scheduled with a sleep study. Please advise.

## 2023-07-21 ENCOUNTER — Telehealth: Payer: Self-pay | Admitting: *Deleted

## 2023-07-21 NOTE — Telephone Encounter (Signed)
   Pre-operative Risk Assessment    Patient Name: Johnathan [name]  DOB: 03-Jan-1956 MRN: [id number]      Request for Surgical Clearance    Procedure:   Right first metatarsophalangeal joint arthrodesis, right foot deep soft issue mass excision medial and lateral about the Hallux  Date of Surgery: 08/13/2023                                 Surgeon:  Dr. Nicki Guadalajara Surgeon's Group or Practice Name:  [facility] Phone number:  [phone] Fax number:  [phone]   Type of Clearance Requested:   - Medical    Type of Anesthesia:   Choice   Additional requests/questions:  Pt has a previous clearance with Lorin Picket, NP on July 10,2024.  Signed, Emmit Pomfret   07/21/2023, 12:42 PM

## 2023-07-21 NOTE — Telephone Encounter (Signed)
   Patient Name: Duglas Heier  DOB: 06-Jun-1956 MRN: [id number]  Primary Cardiologist: Nanetta Batty, MD  Chart reviewed as part of pre-operative protocol coverage. Given past medical history and time since last visit, based on ACC/AHA guidelines, Devion Chriscoe is at acceptable risk for the planned procedure without further cardiovascular testing.  Patient was last seen in the office on 07/08/2023 and was cleared for surgery at that time.  Patient is not on any anticoagulation or antiplatelet therapy.  I will route this recommendation to the requesting party via Epic fax function and remove from pre-op pool.  Please call with questions.  Joylene Grapes, NP 07/21/2023, 1:54 PM

## 2023-07-28 ENCOUNTER — Telehealth: Payer: Self-pay | Admitting: Cardiovascular Disease

## 2023-07-28 DIAGNOSIS — G4733 Obstructive sleep apnea (adult) (pediatric): Secondary | ICD-10-CM

## 2023-07-28 NOTE — Telephone Encounter (Signed)
Have Brandi schedule him for his study

## 2023-07-28 NOTE — Telephone Encounter (Signed)
I have not read a sleep study on him in June 2024.  I had remotely seen him in 2021.  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

## 2023-07-28 NOTE — Telephone Encounter (Signed)
Returned pt call to let him know his message will be forwarded to the provider and his nurse. Pt verbalized understanding.  Pt is sleeping terribly.

## 2023-07-28 NOTE — Telephone Encounter (Signed)
Ordered Split-night Study, per Dr. Tresa Endo due to insurance issue.

## 2023-07-28 NOTE — Telephone Encounter (Signed)
Received a message that patient is following up on a sleep study that needs to be scheduled. I do not see an active order in the system. Patient had a home sleep study on 06/24/23 but I do not see the results, I'm not sure if he needed a follow up in lab sleep study after his home sleep test. Please advise.

## 2023-07-30 ENCOUNTER — Telehealth: Payer: Self-pay | Admitting: Family Medicine

## 2023-07-30 NOTE — Telephone Encounter (Signed)
Patient called and would like to know if he can switch from Ambien to Linzess.

## 2023-07-31 MED ORDER — ESZOPICLONE 2 MG PO TABS: 2.0000 mg | ORAL_TABLET | Freq: Every evening | ORAL | 5 refills | Status: DC | PRN

## 2023-07-31 NOTE — Telephone Encounter (Signed)
Spoke with patient regarding results/recommendations.  

## 2023-07-31 NOTE — Telephone Encounter (Signed)
Please inform patient I did call in a prescription of Lunesta for him, 30 tabs with 5 refills.  If needing any additional adjustments to his insomnia treatment, patient is encouraged to make an appointment to discuss with provider.

## 2023-08-08 ENCOUNTER — Emergency Department (HOSPITAL_BASED_OUTPATIENT_CLINIC_OR_DEPARTMENT_OTHER)
Admission: EM | Admit: 2023-08-08 | Discharge: 2023-08-08 | Disposition: A | Discharge status: Home / Self Care | Payer: 59 | Attending: Emergency Medicine | Admitting: Emergency Medicine

## 2023-08-08 ENCOUNTER — Other Ambulatory Visit: Payer: Self-pay

## 2023-08-08 ENCOUNTER — Encounter (HOSPITAL_BASED_OUTPATIENT_CLINIC_OR_DEPARTMENT_OTHER): Payer: Self-pay

## 2023-08-08 ENCOUNTER — Emergency Department (HOSPITAL_BASED_OUTPATIENT_CLINIC_OR_DEPARTMENT_OTHER): Payer: 59

## 2023-08-08 DIAGNOSIS — Z7901 Long term (current) use of anticoagulants: Secondary | ICD-10-CM | POA: Diagnosis not present

## 2023-08-08 DIAGNOSIS — R109 Unspecified abdominal pain: Secondary | ICD-10-CM | POA: Diagnosis present

## 2023-08-08 DIAGNOSIS — R1084 Generalized abdominal pain: Secondary | ICD-10-CM | POA: Diagnosis not present

## 2023-08-08 LAB — COMPREHENSIVE METABOLIC PANEL WITH GFR
ALT: 30 U/L (ref 0–44)
AST: 34 U/L (ref 15–41)
Albumin: 4.4 g/dL (ref 3.5–5.0)
Alkaline Phosphatase: 93 U/L (ref 38–126)
Anion gap: 10 (ref 5–15)
BUN: 11 mg/dL (ref 8–23)
CO2: 26 mmol/L (ref 22–32)
Calcium: 9.6 mg/dL (ref 8.9–10.3)
Chloride: 100 mmol/L (ref 98–111)
Creatinine, Ser: 1.05 mg/dL (ref 0.61–1.24)
GFR, Estimated: >60 mL/min (ref >60)
Glucose, Bld: 112 mg/dL — ABNORMAL HIGH (ref 70–99)
Potassium: 3.9 mmol/L (ref 3.5–5.1)
Sodium: 136 mmol/L (ref 135–145)
Total Bilirubin: 1.6 mg/dL — ABNORMAL HIGH (ref 0.3–1.2)
Total Protein: 7.5 g/dL (ref 6.5–8.1)

## 2023-08-08 LAB — CBC
HCT: 47 % (ref 39.0–52.0)
Hemoglobin: 16 g/dL (ref 13.0–17.0)
MCH: 30.1 pg (ref 26.0–34.0)
MCHC: 34 g/dL (ref 30.0–36.0)
MCV: 88.3 fL (ref 80.0–100.0)
Platelets: 272 10*3/uL (ref 150–400)
RBC: 5.32 MIL/uL (ref 4.22–5.81)
RDW: 13.6 % (ref 11.5–15.5)
WBC: 7.2 10*3/uL (ref 4.0–10.5)
nRBC: 0 % (ref 0.0–0.2)

## 2023-08-08 LAB — URINALYSIS, ROUTINE W REFLEX MICROSCOPIC
Bilirubin Urine: NEGATIVE
Glucose, UA: NEGATIVE mg/dL
Hgb urine dipstick: NEGATIVE
Ketones, ur: NEGATIVE mg/dL
Leukocytes,Ua: NEGATIVE
Nitrite: NEGATIVE
Protein, ur: NEGATIVE mg/dL
Specific Gravity, Urine: 1.02 (ref 1.005–1.030)
pH: 8.5 — ABNORMAL HIGH (ref 5.0–8.0)

## 2023-08-08 LAB — LIPASE, BLOOD: Lipase: 30 U/L (ref 11–51)

## 2023-08-08 MED ORDER — IOHEXOL 300 MG/ML  SOLN
100.0000 mL | Freq: Once | INTRAMUSCULAR | Status: AC | PRN
  Administered 2023-08-08: 100 mL via INTRAVENOUS

## 2023-08-08 NOTE — ED Triage Notes (Signed)
Patient stated he has been having ABD pain and back pain two nights ago. He thought at first it was from lifting at the gym. He also stated he had a BM yesterday and the pain went away but now the pain is back.  No N/V, fever or dysuria.

## 2023-08-08 NOTE — ED Provider Notes (Signed)
[facility] Provider Note   CSN: [id number] Arrival date & time: 08/08/23  4742     History  Chief Complaint  Patient presents with   Abdominal Pain   Back Pain    Johnathan [name] is a 67 y.o. male.   Abdominal Pain Back Pain Associated symptoms: abdominal pain   67 year old male history of hyperlipidemia, atrial fibrillation on anticoagulation presenting for abdominal pain.  Patient states 2 days ago he lifted heavily at the gym.  That night he developed some pain over his left hemiabdomen.  He was slightly worse when he was lying down and sleeping.  He did not have any back pain, groin numbness, urinary symptoms.  He had 1 episode of diarrhea yesterday and the pain improved after that.  No melena or hematochezia.  He has no groin pain or swelling.  He has not any chest pain or shortness of breath.  No left upper quadrant or right upper quadrant pain.  No fevers or chills.  He is eating and drinking normally without difficulty.  He has not had this pain before.  No pain with eating.     Home Medications Prior to Admission medications   Medication Sig Start Date End Date Taking? Authorizing Provider  allopurinol (ZYLOPRIM) 100 MG tablet Take 2 tablets (200 mg total) by mouth daily. 01/19/23   Kuneff, Renee A, DO  atorvastatin (LIPITOR) 40 MG tablet Take 1 tablet (40 mg total) by mouth daily. 01/19/23   Kuneff, Renee A, DO  colchicine 0.6 MG tablet 1 tab po with onset of gout, repeat 1 tab po in 3 hours.Then 1 tab po daily until resolved. 01/19/23   Kuneff, Renee A, DO  dofetilide (TIKOSYN) 500 MCG capsule Take 1 capsule (500 mcg total) by mouth 2 (two) times daily. NEED OV. 05/08/23   Runell Gess, MD  escitalopram (LEXAPRO) 5 MG tablet Take 1 tablet (5 mg total) by mouth daily. 01/19/23   Kuneff, Renee A, DO  eszopiclone (LUNESTA) 2 MG TABS tablet Take 1 tablet (2 mg total) by mouth at bedtime as needed for sleep. Take immediately before  bedtime 07/31/23   Kuneff, Renee A, DO  metoprolol tartrate (LOPRESSOR) 25 MG tablet Take 1 tablet (25 mg total) by mouth as needed (As needed for palpitations). Patient not taking: Reported on 07/08/2023 07/07/23   Cannon Kettle, PA-C  tamsulosin (FLOMAX) 0.4 MG CAPS capsule Take 1 capsule (0.4 mg total) by mouth 2 (two) times daily. 06/30/23   Kuneff, Renee A, DO      Allergies    Sulfa antibiotics    Review of Systems   Review of Systems  Gastrointestinal:  Positive for abdominal pain.  Musculoskeletal:  Positive for back pain.  Review of systems completed and notable as per HPI.  ROS otherwise negative.   Physical Exam Updated Vital Signs BP (!) 155/85   Pulse (!) 57   Temp 97.8 F (36.6 C)   Resp 15   Ht 6\' 1"  (1.854 m)   Wt 104.3 kg   SpO2 99%   BMI 30.34 kg/m  Physical Exam Vitals and nursing note reviewed.  Constitutional:      General: He is not in acute distress.    Appearance: He is well-developed.  HENT:     Head: Normocephalic and atraumatic.  Eyes:     Conjunctiva/sclera: Conjunctivae normal.  Cardiovascular:     Rate and Rhythm: Normal rate and regular rhythm.  Heart sounds: No murmur heard. Pulmonary:     Effort: Pulmonary effort is normal. No respiratory distress.     Breath sounds: Normal breath sounds.  Abdominal:     Palpations: Abdomen is soft.     Tenderness: There is abdominal tenderness in the left lower quadrant. There is no right CVA tenderness, left CVA tenderness, guarding or rebound. Negative signs include Murphy's sign and Rovsing's sign.     Comments: Mild tenderness in the left lower quadrant.  No overlying rash.  Musculoskeletal:        General: No swelling.     Cervical back: Neck supple.  Skin:    General: Skin is warm and dry.     Capillary Refill: Capillary refill takes less than 2 seconds.  Neurological:     Mental Status: He is alert.  Psychiatric:        Mood and Affect: Mood normal.     ED Results / Procedures /  Treatments   Labs (all labs ordered are listed, but only abnormal results are displayed) Labs Reviewed  COMPREHENSIVE METABOLIC PANEL - Abnormal; Notable for the following components:      Result Value   Glucose, Bld 112 (*)    Total Bilirubin 1.6 (*)    All other components within normal limits  URINALYSIS, ROUTINE W REFLEX MICROSCOPIC - Abnormal; Notable for the following components:   pH 8.5 (*)    All other components within normal limits  LIPASE, BLOOD  CBC    EKG EKG Interpretation Date/Time:  Saturday August 08 2023 09:45:14 EDT Ventricular Rate:  59 PR Interval:  165 QRS Duration:  97 QT Interval:  416 QTC Calculation: 413 R Axis:   -3  Text Interpretation: Sinus rhythm Confirmed by Fulton Reek [phone]) on 08/08/2023 9:46:51 AM  Radiology CT ABDOMEN PELVIS W CONTRAST  Result Date: 08/08/2023 CLINICAL DATA:  LEFT lower quadrant pain. EXAM: CT ABDOMEN AND PELVIS WITH CONTRAST TECHNIQUE: Multidetector CT imaging of the abdomen and pelvis was performed using the standard protocol following bolus administration of intravenous contrast. RADIATION DOSE REDUCTION: This exam was performed according to the departmental dose-optimization program which includes automated exposure control, adjustment of the mA and/or kV according to patient size and/or use of iterative reconstruction technique. CONTRAST:  OMNIPAQUE IOHEXOL 300 MG/ML  SOLN COMPARISON:  None Available. FINDINGS: Lower chest: Lung bases are clear. Hepatobiliary: Several subcentimeter benign appearing hepatic hypodensities. Normal gallbladder. No biliary duct dilatation. Common bile duct is normal. Pancreas: Pancreas is normal. No ductal dilatation. No pancreatic inflammation. Spleen: Normal spleen Adrenals/urinary tract: Adrenal glands and kidneys are normal. The ureters and bladder normal. Stomach/Bowel: Stomach, small bowel, appendix, and cecum are normal. The colon and rectosigmoid colon are normal.  Vascular/Lymphatic: Abdominal aorta is normal caliber with atherosclerotic calcification. There is no retroperitoneal or periportal lymphadenopathy. No pelvic lymphadenopathy. Reproductive: Prostate unremarkable Other: No free fluid. Musculoskeletal: No aggressive osseous lesion. IMPRESSION: 1. No acute findings in the abdomen pelvis. 2. Normal appendix. 3. Normal bowel. 4. Aortic Atherosclerosis (ICD10-I70.0). Electronically Signed   By: Genevive Bi M.D.   On: 08/08/2023 11:23    Procedures Procedures    Medications Ordered in ED Medications  iohexol (OMNIPAQUE) 300 MG/ML solution 100 mL (100 mLs Intravenous Contrast Given 08/08/23 1100)    ED Course/ Medical Decision Making/ A&P  Medical Decision Making Amount and/or Complexity of Data Reviewed Labs: ordered. Radiology: ordered.  Risk Prescription drug management.   Medical Decision Making:   Johnathan [name] is a 67 y.o. male who presented to the ED today with left-sided abdominal pain.  Vital signs reviewed.  On exam he is well-appearing, mild left lower quadrant tenderness.  He has had some diarrhea, could be diverticulitis.  Also consider possible muscular strain given recent lifting, diarrheal illness.  He is no chest pain, EKG looks okay not consistent with ACS.  No right upper or lower quadrant tenderness, low concern for diverticulitis, appendicitis.   Patient placed on continuous vitals and telemetry monitoring while in ED which was reviewed periodically.  Reviewed and confirmed nursing documentation for past medical history, family history, social history.  Initial Study Results:   Laboratory  All laboratory results reviewed.  Labs notable for lipase normal, CBC unremarkable, CMP notable for bilirubin 1.6.  Urinalysis unremarkable.  EKG EKG was reviewed independently. Rate, rhythm, axis, intervals all examined and without medically relevant abnormality. ST segments without concerns for  elevations.    Radiology:  All images reviewed independently. Agree with radiology report at this time.     Reassessment and Plan:   On reassessment he is asymptomatic.  No pain or tenderness.  Lab work and CT scan are reassuring.  Also any signs of acute pathology.  Wonder if he may have early diarrheal illness versus muscular strain.  He has no back pain or signs of neurologic compromise.  Recommend close PCP follow-up, return precautions given.  Discharged in stable condition.   Patient's presentation is most consistent with acute complicated illness / injury requiring diagnostic workup.           Final Clinical Impression(s) / ED Diagnoses Final diagnoses:  Generalized abdominal pain    Rx / DC Orders ED Discharge Orders     None         Laurence Spates, MD 08/08/23 1141

## 2023-08-08 NOTE — Discharge Instructions (Signed)
You were seen today for abdominal pain.  Your CT scan and lab work were reassuring.  I recommend you call your doctor on Monday to schedule close follow-up.  If you develop worsening pain, difficulty breathing, chest pain or any new concerning symptoms you should return to the ED.

## 2023-08-10 ENCOUNTER — Telehealth: Payer: Self-pay | Admitting: Cardiovascular Disease

## 2023-08-10 ENCOUNTER — Other Ambulatory Visit: Payer: Self-pay

## 2023-08-10 ENCOUNTER — Emergency Department (HOSPITAL_COMMUNITY)
Admission: EM | Admit: 2023-08-10 | Discharge: 2023-08-10 | Disposition: A | Discharge status: Home / Self Care | Payer: 59 | Attending: Emergency Medicine | Admitting: Emergency Medicine

## 2023-08-10 ENCOUNTER — Encounter (HOSPITAL_COMMUNITY): Payer: Self-pay | Admitting: Emergency Medicine

## 2023-08-10 DIAGNOSIS — R1032 Left lower quadrant pain: Secondary | ICD-10-CM | POA: Insufficient documentation

## 2023-08-10 DIAGNOSIS — R109 Unspecified abdominal pain: Secondary | ICD-10-CM

## 2023-08-10 HISTORY — DX: Unspecified atrial flutter: I48.92

## 2023-08-10 HISTORY — DX: Unspecified atrial fibrillation: I48.91

## 2023-08-10 MED ORDER — METHOCARBAMOL 500 MG PO TABS: 500.0000 mg | ORAL_TABLET | Freq: Three times a day (TID) | ORAL | 0 refills | Status: DC | PRN

## 2023-08-10 NOTE — ED Provider Notes (Signed)
Johnathan [name] EMERGENCY DEPARTMENT AT [facility] Provider Note   CSN: [id number] Arrival date & time: 08/10/23  4098     History  Chief Complaint  Patient presents with   Flank Pain    Left side     Johnathan [name] is a 67 y.o. male.   Flank Pain  Patient presents with left-sided flank/abdominal pain.  Seen in the ER 2 days ago for the same.  Began a few days ago after workout.  States more abdominal work done than usual.  Developed pain about a day later.  Worse with certain movements.  States he been feeling somewhat better but then last night a cockroach was seen at the house he moved quickly and had increasing pain in the abdomen.  Also has had some constipation.  States that is somewhat unusual for him.  Still passing gas.  States abdomen feels more swollen.     Home Medications Prior to Admission medications   Medication Sig Start Date End Date Taking? Authorizing Provider  methocarbamol (ROBAXIN) 500 MG tablet Take 1 tablet (500 mg total) by mouth every 8 (eight) hours as needed for muscle spasms. 08/10/23  Yes Benjiman Core, MD  allopurinol (ZYLOPRIM) 100 MG tablet Take 2 tablets (200 mg total) by mouth daily. 01/19/23   Kuneff, Renee A, DO  atorvastatin (LIPITOR) 40 MG tablet Take 1 tablet (40 mg total) by mouth daily. 01/19/23   Kuneff, Renee A, DO  colchicine 0.6 MG tablet 1 tab po with onset of gout, repeat 1 tab po in 3 hours.Then 1 tab po daily until resolved. 01/19/23   Kuneff, Renee A, DO  dofetilide (TIKOSYN) 500 MCG capsule Take 1 capsule (500 mcg total) by mouth 2 (two) times daily. NEED OV. 05/08/23   Runell Gess, MD  escitalopram (LEXAPRO) 5 MG tablet Take 1 tablet (5 mg total) by mouth daily. 01/19/23   Kuneff, Renee A, DO  eszopiclone (LUNESTA) 2 MG TABS tablet Take 1 tablet (2 mg total) by mouth at bedtime as needed for sleep. Take immediately before bedtime 07/31/23   Kuneff, Renee A, DO  metoprolol tartrate (LOPRESSOR) 25 MG tablet Take 1 tablet (25  mg total) by mouth as needed (As needed for palpitations). Patient not taking: Reported on 07/08/2023 07/07/23   Cannon Kettle, PA-C  tamsulosin (FLOMAX) 0.4 MG CAPS capsule Take 1 capsule (0.4 mg total) by mouth 2 (two) times daily. 06/30/23   Kuneff, Renee A, DO      Allergies    Sulfa antibiotics    Review of Systems   Review of Systems  Genitourinary:  Positive for flank pain.    Physical Exam Updated Vital Signs BP 132/80   Pulse (!) 55   Temp 98.1 F (36.7 C) (Oral)   Resp 12   Ht 6\' 1"  (1.854 m)   Wt 104.3 kg   SpO2 96%   BMI 30.34 kg/m  Physical Exam Vitals and nursing note reviewed.  Abdominal:     Comments: Slight tenderness to left lateral lower abdomen.  Does have slight change in the skin that is around the size and shape where he may have had a EKG lead a couple days ago.  No blisters.  No ecchymosis.  Musculoskeletal:     Cervical back: Neck supple.  Skin:    Capillary Refill: Capillary refill takes less than 2 seconds.  Neurological:     Mental Status: He is alert.     ED Results / Procedures / Treatments  Labs (all labs ordered are listed, but only abnormal results are displayed) Labs Reviewed - No data to display  EKG None  Radiology CT ABDOMEN PELVIS W CONTRAST  Result Date: 08/08/2023 CLINICAL DATA:  LEFT lower quadrant pain. EXAM: CT ABDOMEN AND PELVIS WITH CONTRAST TECHNIQUE: Multidetector CT imaging of the abdomen and pelvis was performed using the standard protocol following bolus administration of intravenous contrast. RADIATION DOSE REDUCTION: This exam was performed according to the departmental dose-optimization program which includes automated exposure control, adjustment of the mA and/or kV according to patient size and/or use of iterative reconstruction technique. CONTRAST:  OMNIPAQUE IOHEXOL 300 MG/ML  SOLN COMPARISON:  None Available. FINDINGS: Lower chest: Lung bases are clear. Hepatobiliary: Several subcentimeter benign  appearing hepatic hypodensities. Normal gallbladder. No biliary duct dilatation. Common bile duct is normal. Pancreas: Pancreas is normal. No ductal dilatation. No pancreatic inflammation. Spleen: Normal spleen Adrenals/urinary tract: Adrenal glands and kidneys are normal. The ureters and bladder normal. Stomach/Bowel: Stomach, small bowel, appendix, and cecum are normal. The colon and rectosigmoid colon are normal. Vascular/Lymphatic: Abdominal aorta is normal caliber with atherosclerotic calcification. There is no retroperitoneal or periportal lymphadenopathy. No pelvic lymphadenopathy. Reproductive: Prostate unremarkable Other: No free fluid. Musculoskeletal: No aggressive osseous lesion. IMPRESSION: 1. No acute findings in the abdomen pelvis. 2. Normal appendix. 3. Normal bowel. 4. Aortic Atherosclerosis (ICD10-I70.0). Electronically Signed   By: Genevive Bi M.D.   On: 08/08/2023 11:23    Procedures Procedures    Medications Ordered in ED Medications - No data to display  ED Course/ Medical Decision Making/ A&P                                 Medical Decision Making Risk Prescription drug management.   Patient with left-sided abdominal/flank pain.  Recently seen for same.  Began after workup.  Reviewed note imaging and blood work from 2 days ago.  Do not think would need to repeat at this time.  Most likely musculoskeletal pain.  States some mild constipation however has had normal bowel 2 days ago.  Doubt obstruction at this time.  Rather benign abdominal exam.  Will add some muscle relaxers.  Follow-up with PCP as needed.  Appears stable for discharge home.  Can do over-the-counter medicines for constipation if needed.        Final Clinical Impression(s) / ED Diagnoses Final diagnoses:  Flank pain    Rx / DC Orders ED Discharge Orders          Ordered    methocarbamol (ROBAXIN) 500 MG tablet  Every 8 hours PRN        08/10/23 0901              Benjiman Core, MD 08/10/23 [phone]

## 2023-08-10 NOTE — Telephone Encounter (Signed)
No problem mixing the tikosyn and methocarbamol

## 2023-08-10 NOTE — Telephone Encounter (Signed)
Pt c/o medication issue:  1. Name of Medication:  methocarbamol   2. How are you currently taking this medication (dosage and times per day)?   3. Are you having a reaction (difficulty breathing--STAT)?   4. What is your medication issue?  Patient called stating he was just prescribed this muscle relaxer and wants to make sure it will not interfere with his TIKOSYN.  He also wants to know if he can take laxative.

## 2023-08-10 NOTE — ED Notes (Addendum)
Flank pain on left side. No tenderness on palpation. Swelling noted by this RN to affected side.

## 2023-08-10 NOTE — Telephone Encounter (Signed)
Called patient, advised of message from [facility].  Patient verbalized understanding.    

## 2023-08-10 NOTE — ED Notes (Signed)
Patient discharged by this RN. Patient verbalized understanding of instructions and offered to ask questions. Patient in no apparent distress at time of discharge, ambulatory with steady gait.

## 2023-08-10 NOTE — ED Triage Notes (Signed)
Pt POV from home due to left side pain and swelling. Pt reports he does lift weights.  Pt report "pulling" yesterday and the pain has progressively gotten worse and now there is swelling that has not been there.

## 2023-08-10 NOTE — Discharge Instructions (Addendum)
You may have some constipation to.  Over-the-counter medicine such as MiraLAX or Colace may also help with this.

## 2023-08-13 ENCOUNTER — Other Ambulatory Visit: Payer: Self-pay | Admitting: Cardiovascular Disease

## 2023-08-25 ENCOUNTER — Telehealth: Payer: Self-pay | Admitting: *Deleted

## 2023-08-25 NOTE — Telephone Encounter (Signed)
LMOVM requesting call back to follow-up on sleep study. In-home test was reordered today and  Direct number and office hours provided for return call.

## 2023-08-25 NOTE — Telephone Encounter (Signed)
Started PA for Split-night study, per [facility] provider portal, HST does not require prior authorization. HST re-ordered and split-night cancelled.

## 2023-08-25 NOTE — Telephone Encounter (Incomplete Revision)
Started PA for Split-night study, per [facility] provider portal, HST does not require prior authorization. HST re-ordered and split-night cancelled.

## 2023-08-25 NOTE — Addendum Note (Signed)
Addended by: Brunetta Genera on: 08/25/2023 04:55 PM   Modules accepted: Orders

## 2023-08-26 ENCOUNTER — Telehealth: Payer: Self-pay | Admitting: *Deleted

## 2023-08-26 NOTE — Telephone Encounter (Addendum)
Prior Authorization for HST sent to [facility] via web portal. Tracking Number .  Notification or Prior Authorization is not required for the requested services.  Decision ID #: [id number]

## 2023-08-27 ENCOUNTER — Telehealth: Payer: Self-pay | Admitting: Cardiovascular Disease

## 2023-08-27 NOTE — Telephone Encounter (Signed)
Returned call to pt. Will forward this to the pharmacy. Pt states they want him to take Ibuprofen and Coltracene. Are these safe and well?

## 2023-08-27 NOTE — Telephone Encounter (Signed)
Yes ok to take all of the above

## 2023-08-27 NOTE — Telephone Encounter (Signed)
Pt c/o medication issue:  1. Name of Medication: Cefadroxil 500 mg  2. How are you currently taking this medication (dosage and times per day)?    3. Are you having a reaction (difficulty breathing--STAT)?   4. What is your medication issue? Patient wants to make sure that this medicine is alright for him to take and will not interfere with any of his other medicine. He said he was especially concerned about it it interfering with his Tikosyn

## 2023-08-27 NOTE — Telephone Encounter (Signed)
Returned call to pt. With the advice from the pharmacist. Pt verbalized understanding.

## 2023-09-01 ENCOUNTER — Telehealth: Payer: Self-pay | Admitting: Cardiovascular Disease

## 2023-09-01 NOTE — Telephone Encounter (Signed)
Pt c/o medication issue:  1. Name of Medication: Aspirin  2. How are you currently taking this medication (dosage and times per day)?   3. Are you having a reaction (difficulty breathing--STAT)? No  4. What is your medication issue? Patient had a procedure on his foot 08/15. Patient would like to know if he can take a low dose aspirin. Patient is concerned about getting a blood clot. Please advise.

## 2023-09-01 NOTE — Telephone Encounter (Signed)
Patient states he had surgery on his foot. He states his friend told him he should have been prescribed a low dose aspirin.  He was concerned because he was not prescribed a low dose aspirin to prevent clotting. He have an appointment tomorrow with surgeon. Did advise to discuss the aspirin with surgeon.

## 2023-09-02 NOTE — Telephone Encounter (Signed)
Spoke with patient. He is aware of upcoming HST appointment on 09/11/23. Pt denies any additional questions or concerns at this time.

## 2023-09-11 ENCOUNTER — Ambulatory Visit (HOSPITAL_BASED_OUTPATIENT_CLINIC_OR_DEPARTMENT_OTHER): Payer: 59 | Attending: Cardiovascular Disease | Admitting: Cardiovascular Disease

## 2023-09-11 DIAGNOSIS — G4733 Obstructive sleep apnea (adult) (pediatric): Secondary | ICD-10-CM | POA: Insufficient documentation

## 2023-09-25 ENCOUNTER — Ambulatory Visit: Payer: 59 | Admitting: Family Medicine

## 2023-09-25 ENCOUNTER — Encounter (HOSPITAL_BASED_OUTPATIENT_CLINIC_OR_DEPARTMENT_OTHER): Payer: Self-pay | Admitting: Cardiovascular Disease

## 2023-09-25 NOTE — Procedures (Unsigned)
Patient Name: Johnathan [name], Johnathan [name] Date: 09/13/2023 Gender: Male D.O.B: January 30, 1956 Age (years): 29 Referring Provider: Nicki Guadalajara MD, ABSM Height (inches): 73 Interpreting Physician: Nicki Guadalajara MD, ABSM Weight (lbs): 230 RPSGT: [place] BMI: 30 MRN: [id number] Neck Size: 16.00 <br> <br> CLINICAL INFORMATION Sleep Study Type: HST  Indication for sleep study: PAF  Epworth Sleepiness Score: 4  SLEEP STUDY TECHNIQUE A multi-channel overnight portable sleep study was performed. The channels recorded were: nasal airflow, thoracic respiratory movement, and oxygen saturation with a pulse oximetry. Snoring was also monitored.  MEDICATIONS allopurinol (ZYLOPRIM) 100 MG tablet atorvastatin (LIPITOR) 40 MG tablet colchicine 0.6 MG tablet dofetilide (TIKOSYN) 500 MCG capsule escitalopram (LEXAPRO) 5 MG tablet eszopiclone (LUNESTA) 2 MG TABS tablet methocarbamol (ROBAXIN) 500 MG tablet metoprolol tartrate (LOPRESSOR) 25 MG tablet tamsulosin (FLOMAX) 0.4 MG CAPS capsule Patient self administered medications include: N/A.  SLEEP ARCHITECTURE Patient was studied for 391.4 minutes. The sleep efficiency was 100.0 % and the patient was supine for 0%. The arousal index was 0.0 per hour.  RESPIRATORY PARAMETERS The overall AHI was 29.3 per hour, with a central apnea index of 0 per hour.  The oxygen nadir was 87% during sleep.  CARDIAC DATA Mean heart rate during sleep was 51.5 bpm. The slowest heart rate was 40 bpm.  IMPRESSIONS - Moderate obstructive sleep apnea occurred during this study (AHI 29.3/h). There is a significant positional component with supine sleep AHI 41.2/h versus non-supine sleep AHI 8.7/h. - Mild oxygen desaturation was noted during this study to a nadir of 87%. - No snoring was audible during this study.  DIAGNOSIS - Obstructive Sleep Apnea (G47.33)  RECOMMENDATIONS - In this patient with cardiovascular co-morbities recommend therapeutic  CPAP for treatment of his sleep disordered breathing. Can initiate a trial of Auto-PAP therapy with EPR of 3 at 7 - 18 cm of water. - Effort should be made to optimize nasal and oropharyngeal patency. - Positional therapy avoiding supine position during sleep. - Avoid alcohol, sedatives and other CNS depressants that may worsen sleep apnea and disrupt normal sleep architecture. - Sleep hygiene should be reviewed to assess factors that may improve sleep quality. - Weight management and regular exercise should be initiated or continued. - Recommend a download and sleep clinic evaluation after 4-6 weeks of therapy.   [Electronically signed] 09/25/2023 04:24 PM  Nicki Guadalajara MD, [facility], ABSM Diplomate, American Board of Sleep Medicine  NPI: [id number]  [facility] PH: [phone]   FX: [phone] ACCREDITED BY THE AMERICAN ACADEMY OF SLEEP MEDICINE

## 2023-09-28 ENCOUNTER — Encounter: Payer: Self-pay | Admitting: Family Medicine

## 2023-09-28 ENCOUNTER — Ambulatory Visit (INDEPENDENT_AMBULATORY_CARE_PROVIDER_SITE_OTHER): Payer: 59 | Admitting: Family Medicine

## 2023-09-28 VITALS — BP 138/78 | HR 78 | Temp 98.3°F | Wt 235.6 lb

## 2023-09-28 DIAGNOSIS — M79671 Pain in right foot: Secondary | ICD-10-CM

## 2023-09-28 DIAGNOSIS — M1009 Idiopathic gout, multiple sites: Secondary | ICD-10-CM | POA: Diagnosis not present

## 2023-09-28 DIAGNOSIS — R350 Frequency of micturition: Secondary | ICD-10-CM

## 2023-09-28 LAB — URIC ACID: Uric Acid, Serum: 4.5 mg/dL (ref 4.0–7.8)

## 2023-09-28 MED ORDER — PREDNISONE 20 MG PO TABS: ORAL_TABLET | ORAL | 0 refills | Status: DC

## 2023-09-28 MED ORDER — ALLOPURINOL 300 MG PO TABS: 300.0000 mg | ORAL_TABLET | Freq: Every day | ORAL | 3 refills | Status: DC

## 2023-09-28 NOTE — Progress Notes (Signed)
Johnathan [name] , [birth year], 67 y.o., male MRN: [id number] Patient Care Team    Relationship Specialty Notifications Start End  Johnathan Leatherwood, DO PCP - General Family Medicine  04/15/17   Johnathan Gess, MD PCP - Cardiology Cardiology  12/21/19   Johnathan [name] PCP - Pulmonology Family Medicine  12/21/19   Johnathan Gess, MD Consulting Physician Cardiology  03/22/18   Johnathan Fredrickson, MD Consulting Physician Gastroenterology  07/19/19   Johnathan Kelp, MD Consulting Physician Sports Medicine  03/07/20   Johnathan Pippin, MD Consulting Physician Orthopedic Surgery  03/07/20   Johnathan Bihari, MD Consulting Physician Cardiology  04/02/20     Chief Complaint  Patient presents with   Foot Swelling    Had surgery on right foot on 08/15 surgeon advised pt to be tested for gout.     Subjective: Johnathan [name] is a 67 y.o. Pt presents for an OV with complaints of right toe swelling and pain. He underwent a 1 st metatarsal fusion 08/13/2023. He has had trouble with swelling since that time. He endorses not resting long enough after surgery, but has tried to better about that. He is still in cam walker. He states he surgeon encouraged him to see if he is having a gout flare with his pcp.      06/04/2023    9:40 AM 01/26/2023    8:51 AM 08/01/2022    4:05 PM 01/09/2022    8:45 AM 05/29/2021    7:59 AM  Depression screen PHQ 2/9  Decreased Interest 0 0 0 0 0  Down, Depressed, Hopeless 0 0 0 0 0  PHQ - 2 Score 0 0 0 0 0  Altered sleeping   1 1   Tired, decreased energy   0 0   Change in appetite   0 0   Feeling bad or failure about yourself    0 0   Trouble concentrating   0 0   Moving slowly or fidgety/restless   0 0   Suicidal thoughts   0 0   PHQ-9 Score   1 1     Allergies  Allergen Reactions   Sulfa Antibiotics     Unknown reaction in childhood   Social History   Social History Narrative   Married to [place]. 2 children [place].    BS degree.    Wear seatbelt. Wears  a bicycle helmet. Smoke detector in the home.   Firearms in the home.   Feels safe in  Relationships.   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   Past Medical History:  Diagnosis Date   Arthritis    hands and feet   Atrial fib/flutter, transient (HCC)    patient takes tykosyn   Cutaneous skin tags 01/09/2022   Dysrhythmia    a-fib   Elevated LFTs    Hearing aid worn 2021   right   Hyperlipidemia    Persistent atrial fibrillation (HCC)    RLS (restless legs syndrome)    Sleep apnea    does not use CPAP   Past Surgical History:  Procedure Laterality Date   ATRIAL FIBRILLATION ABLATION  2015   BONE EXCISION Right 05/09/2021   Procedure: RIGHT TIBIA EXCISION;  Surgeon: Johnathan Pippin, MD;  Location: [facility];  Service: Orthopedics;  Laterality: Right;  COLONOSCOPY     EXCISION MORTON'S NEUROMA Right 05/09/2021   Procedure: EXCISION OF RIGHT SECOND TOE MASS;  Surgeon: Terance Hart, MD;  Location: [facility];  Service: Orthopedics;  Laterality: Right;   FOOT SURGERY Left    HERNIA REPAIR     LUMBAR DISC SURGERY  2012   L3-L4, No hardware   metatarsal fusion     right large toe   Family History  Problem Relation Age of Onset   Diabetes Mother    Diabetes Brother    Early death Brother    Heart disease Brother    Colon cancer Neg Hx    Esophageal cancer Neg Hx    Rectal cancer Neg Hx    Stomach cancer Neg Hx    Liver disease Neg Hx    Pancreatic cancer Neg Hx    Colon polyps Neg Hx    Allergies as of 09/28/2023       Reactions   Sulfa Antibiotics    Unknown reaction in childhood        Medication List        Accurate as of September 28, 2023  2:02 PM. If you have any questions, ask your nurse or doctor.          STOP taking these medications    methocarbamol 500 MG tablet Commonly known as: ROBAXIN Stopped by: Johnathan [name]    tamsulosin 0.4 MG Caps capsule Commonly known as: FLOMAX Stopped by: Johnathan [name]       TAKE these medications    allopurinol 300 MG tablet Commonly known as: ZYLOPRIM Take 1 tablet (300 mg total) by mouth daily. What changed:  medication strength how much to take Changed by: Johnathan [name]   atorvastatin 40 MG tablet Commonly known as: LIPITOR Take 1 tablet (40 mg total) by mouth daily.   colchicine 0.6 MG tablet 1 tab po with onset of gout, repeat 1 tab po in 3 hours.Then 1 tab po daily until resolved.   dofetilide 500 MCG capsule Commonly known as: TIKOSYN Take 1 capsule (500 mcg total) by mouth 2 (two) times daily.   escitalopram 5 MG tablet Commonly known as: LEXAPRO Take 1 tablet (5 mg total) by mouth daily.   eszopiclone 2 MG Tabs tablet Commonly known as: Lunesta Take 1 tablet (2 mg total) by mouth at bedtime as needed for sleep. Take immediately before bedtime   metoprolol tartrate 25 MG tablet Commonly known as: LOPRESSOR Take 1 tablet (25 mg total) by mouth as needed (As needed for palpitations).   predniSONE 20 MG tablet Commonly known as: DELTASONE 60 mg x3d, 40 mg x3d, 20 mg x2d, 10 mg x2d Started by: Johnathan [name]        All past medical history, surgical history, allergies, family history, immunizations andmedications were updated in the EMR today and reviewed under the history and medication portions of their EMR.     ROS Negative, with the exception of above mentioned in HPI   Objective:  BP 138/78   Pulse 78   Temp 98.3 F (36.8 C)   Wt 235 lb 9.6 oz (106.9 kg)   SpO2 97%   BMI 31.08 kg/m  Body mass index is 31.08 kg/m. Physical Exam Vitals and nursing note reviewed.  Constitutional:      General: He is not in acute distress.    Appearance: Normal appearance. He is not ill-appearing, toxic-appearing or diaphoretic.  HENT:     Head: Normocephalic and atraumatic.  Eyes:     General: No scleral icterus.       Right eye: No  discharge.        Left eye: No discharge.     Extraocular Movements: Extraocular movements intact.     Pupils: Pupils are equal, round, and reactive to light.  Musculoskeletal:        General: Swelling present. No tenderness.     Comments: Rt large toe: Swelling present large toe and anterior portion of foot. No redness. Well healed scar 1 st metatarsal at lx of fusion.   Skin:    General: Skin is warm and dry.     Coloration: Skin is not jaundiced or pale.     Findings: No rash.  Neurological:     Mental Status: He is alert and oriented to person, place, and time. Mental status is at baseline.  Psychiatric:        Mood and Affect: Mood normal.        Behavior: Behavior normal.        Thought Content: Thought content normal.        Judgment: Judgment normal.      No results found. No results found. No results found for this or any previous visit (from the past 24 hour(s)).  Assessment/Plan: Savio Thiesse is a 67 y.o. male present for OV for  Acute idiopathic gout of multiple sites/foot pain He did have another gout attack about month prior to surgery. Will go ahead and increase allopurinol to 300 mg every day and check to make sure levels are meeting goal <6. Today's exam is not consistent with gout flare. It is not red or tender like usual gout flares.  Encouraged him to keep elevated when possible and continue follow up with surgeon.  After discuss, elected to go ahead and provide steroid taper. - Uric acid Continue low purine diet> which he has been doing well with the help of his wife.  Urinary frequency Did not tolerate flomax.  - Ambulatory referral to Urology   Reviewed expectations re: course of current medical issues. Discussed self-management of symptoms. Outlined signs and symptoms indicating need for more acute intervention. Patient verbalized understanding and all questions were answered. Patient received an After-Visit Summary.    Orders Placed This  Encounter  Procedures   Uric acid   Ambulatory referral to Urology   Meds ordered this encounter  Medications   allopurinol (ZYLOPRIM) 300 MG tablet    Sig: Take 1 tablet (300 mg total) by mouth daily.    Dispense:  90 tablet    Refill:  3   predniSONE (DELTASONE) 20 MG tablet    Sig: 60 mg x3d, 40 mg x3d, 20 mg x2d, 10 mg x2d    Dispense:  18 tablet    Refill:  0   Referral Orders         Ambulatory referral to Urology       Note is dictated utilizing voice recognition software. Although note has been proof read prior to signing, occasional typographical errors still can be missed. If any questions arise, please do not hesitate to call for verification.   electronically signed by:  Johnathan Pacini, DO  [facility] [place]

## 2023-10-05 ENCOUNTER — Ambulatory Visit: Payer: 59 | Attending: Cardiovascular Disease | Admitting: Cardiovascular Disease

## 2023-10-05 ENCOUNTER — Encounter: Payer: Self-pay | Admitting: Urology

## 2023-10-05 ENCOUNTER — Ambulatory Visit: Payer: 59 | Admitting: Urology

## 2023-10-05 ENCOUNTER — Encounter: Payer: Self-pay | Admitting: Cardiovascular Disease

## 2023-10-05 VITALS — BP 157/88 | HR 59 | Ht 73.0 in | Wt 235.0 lb

## 2023-10-05 VITALS — BP 136/74 | HR 63 | Ht 73.0 in | Wt 233.4 lb

## 2023-10-05 DIAGNOSIS — R972 Elevated prostate specific antigen [PSA]: Secondary | ICD-10-CM | POA: Diagnosis not present

## 2023-10-05 DIAGNOSIS — I4821 Permanent atrial fibrillation: Secondary | ICD-10-CM

## 2023-10-05 DIAGNOSIS — R399 Unspecified symptoms and signs involving the genitourinary system: Secondary | ICD-10-CM

## 2023-10-05 DIAGNOSIS — E782 Mixed hyperlipidemia: Secondary | ICD-10-CM | POA: Diagnosis not present

## 2023-10-05 DIAGNOSIS — G4733 Obstructive sleep apnea (adult) (pediatric): Secondary | ICD-10-CM

## 2023-10-05 LAB — URINALYSIS, ROUTINE W REFLEX MICROSCOPIC
Bilirubin, UA: NEGATIVE
Glucose, UA: NEGATIVE
Ketones, UA: NEGATIVE
Leukocytes,UA: NEGATIVE
Nitrite, UA: NEGATIVE
Protein,UA: NEGATIVE
RBC, UA: NEGATIVE
Specific Gravity, UA: 1.015 (ref 1.005–1.030)
Urobilinogen, Ur: 0.2 mg/dL (ref 0.2–1.0)
pH, UA: 7 (ref 5.0–7.5)

## 2023-10-05 MED ORDER — SILODOSIN 8 MG PO CAPS
8.0000 mg | ORAL_CAPSULE | Freq: Every day | ORAL | 3 refills | Status: DC
Start: 2023-10-05 — End: 2024-03-02

## 2023-10-05 NOTE — Patient Instructions (Addendum)
Medication Instructions:  Your physician recommends that you continue on your current medications as directed. Please refer to the Current Medication list given to you today.  *If you need a refill on your cardiac medications before your next appointment, please call your pharmacy*   Follow-Up: At [facility], you and your health needs are our priority.  As part of our continuing mission to provide you with exceptional heart care, we have created designated Provider Care Teams.  These Care Teams include your primary Cardiologist (physician) and Advanced Practice Providers (APPs -  Physician Assistants and Nurse Practitioners) who all work together to provide you with the care you need, when you need it.  We recommend signing up for the patient portal called "MyChart".  Sign up information is provided on this After Visit Summary.  MyChart is used to connect with patients for Virtual Visits (Telemedicine).  Patients are able to view lab/test results, encounter notes, upcoming appointments, etc.  Non-urgent messages can be sent to your provider as well.   To learn more about what you can do with MyChart, go to [email or web address].    Your next appointment:   12 month(s)  Provider:   Nanetta Batty, MD     Other Instructions Ashley Mariner- Sleep Coordinator: [phone]

## 2023-10-05 NOTE — Progress Notes (Signed)
10/05/2023 Johnathan [name]   [birth year]  [id number]  Primary Physician Johnathan Leatherwood, DO Primary Cardiologist: Johnathan Gess MD Johnathan [name]  HPI:  Johnathan [name] is a 67 y.o.  mildly overweight married Caucasian male father of 2 children, grandfather of 1 grandchild , who works at [employer] in [place].   He was referred by Dr. Darryll [name] D.O. establish her cardiovascular practice because his prior A. fib history.  I last saw him in the office 03/18/2022.[name]  His cardiovascular risk factor profile is notable for treated hyperlipidemia and family history. Brother died of myocardial infarction at age 72. He's never had a heart attack or stroke. He denies chest pain or shortness of breathHe had A. fib ablation at [facility] 8  years ago by Dr. Ronn [name]. He currently is on Tikosyn. He is not on oral anticoagulation. He said 4-5 DC cardioversions in the past since his ablation, the last one being 4  years ago at [facility] by Dr. Garald [name].    Since I saw him a year and a half ago he is remained stable.  He has had 1 brief episodes of PAF which was self-limited.  He remains on Tikosyn.  He is very active and exercises.   He denies chest pain or shortness of breath.   Current Meds  Medication Sig   allopurinol (ZYLOPRIM) 300 MG tablet Take 1 tablet (300 mg total) by mouth daily.   atorvastatin (LIPITOR) 40 MG tablet Take 1 tablet (40 mg total) by mouth daily.   dofetilide (TIKOSYN) 500 MCG capsule Take 1 capsule (500 mcg total) by mouth 2 (two) times daily.   escitalopram (LEXAPRO) 5 MG tablet Take 1 tablet (5 mg total) by mouth daily.   eszopiclone (LUNESTA) 2 MG TABS tablet Take 1 tablet (2 mg total) by mouth at bedtime as needed for sleep. Take immediately before bedtime   predniSONE (DELTASONE) 20 MG tablet 60 mg x3d, 40 mg x3d, 20 mg x2d, 10 mg x2d     Allergies  Allergen Reactions   Sulfa Antibiotics     Unknown reaction in  childhood    Social History   Socioeconomic History   Marital status: Married    Spouse name: Johnathan [name]   Number of children: 2   Years of education: 16   Highest education level: Not on file  Occupational History   Not on file  Tobacco Use   Smoking status: Never   Smokeless tobacco: Former    Types: Engineer, drilling   Vaping status: Never Used  Substance and Sexual Activity   Alcohol use: Yes    Alcohol/week: 10.0 standard drinks of alcohol    Types: 10 Cans of beer per week    Comment: social   Drug use: No   Sexual activity: Yes    Partners: Female    Comment: Married  Other Topics Concern   Not on file  Social History Narrative   Married to Johnathan [name]. 2 children [place].    BS degree.    Wear seatbelt. Wears a bicycle helmet. Smoke detector in the home.   Firearms in the home.   Feels safe in  Relationships.   Lives in [place] and works as a CEA on Designer, fashion/clothing   Lives in [facility]   Play professional AAA baseball previously   Social Determinants of Corporate investment banker Strain: Not on [employer]  Insecurity: Not on file  Transportation [name]: Not on file  Physical Activity: Not on file  Stress: Not on file  Social Connections: Unknown (05/29/2023)   Received from [facility]   Social Network    Social Network: Not on file  Intimate Partner Violence: Unknown (05/29/2023)   Received from [facility]   HITS    Physically Hurt: Not on file    Insult or Talk Down To: Not on file    Threaten Physical Harm: Not on file    Scream or Curse: Not on file     Review of Systems: General: negative for chills, fever, night sweats or weight changes.  Cardiovascular: negative for chest pain, dyspnea on exertion, edema, orthopnea, palpitations, paroxysmal nocturnal dyspnea or shortness of breath Dermatological: negative for rash Respiratory: negative for cough or wheezing Urologic: negative for  hematuria Abdominal: negative for nausea, vomiting, diarrhea, bright red blood per rectum, [name], or hematemesis Neurologic: negative for visual changes, syncope, or dizziness All other systems reviewed and are otherwise negative except as noted above.    Blood pressure 136/74, pulse 63, height 6\' 1"  (1.854 m), weight 233 lb 6.4 oz (105.9 kg), SpO2 99%.  General appearance: alert and no distress Neck: no adenopathy, no carotid bruit, no JVD, supple, symmetrical, trachea midline, and thyroid not enlarged, symmetric, no tenderness/mass/nodules Lungs: clear to auscultation bilaterally Heart: regular rate and rhythm, S1, S2 normal, no murmur, click, rub or gallop Extremities: extremities normal, atraumatic, no cyanosis or edema Pulses: 2+ and symmetric Skin: Skin color, texture, turgor normal. No rashes or lesions Neurologic: Grossly normal  EKG not performed today      ASSESSMENT AND PLAN:   Permanent atrial fibrillation (HCC) History of PAF status post A-fib ablation by Dr. Ronn [name] at [facility] remotely.  He said several cardioversions since.  His last episode of PAF was approximately 7 months ago that lasted approximately 2 hours.  He is not on oral anticoagulation.  Hyperlipidemia History of hyperlipidemia on statin therapy with lipid profile performed 01/19/2023 revealing a total cholesterol of 176, LDL 104 and HDL 56.  OSA on CPAP History of obstructive sleep apnea with prescription for CPAP which she has not yet started.     Johnathan Gess MD FACP,FACC,FAHA, [facility] 10/05/2023 8:24 AM

## 2023-10-05 NOTE — Progress Notes (Signed)
Assessment: 1. Lower urinary tract symptoms (LUTS)      Plan: Today I had a long discussion with the patient regarding his lower urinary tract symptoms and also his interval rise in PSA.  I discussed with him that I would recommend not drinking a lot of fluid prior to going to bed at night which could help his nocturia as well likely starting CPAP for OSA.  We discussed medical management options.  He would like to try new medical therapy. Rx: Silodosin 8 mg daily Nature of medication including proper utilization as well as potential adverse events and side effects reviewed.  Would consider 5 alpha reductase inhibitor in future. Follow-up 4 months with PSA prior to visit  Chief Complaint: LUTS  History of Present Illness:  Johnathan [name] is a 67 y.o. male who is seen in consultation from [facility], Renee A, DO for evaluation of LUTS. Patient was previously prescribed tamsulosin by PCP but did not tolerate it well.  He said he felt funny and also was surprised with the retrograde ejaculation. Patient reports over the last several years noticing some worsening lower urinary tract symptoms.  He admits to drinking a lot of water including about a liter before he goes to bed at night.  His current IPSS = 10.  The predominant symptoms are daytime frequency and nocturia.  Patient also has sleep apnea recently diagnosed and will plan on starting CPAP soon.  I discussed with him that this could help tremendously in terms of his nocturia.  There is no family history of prostate cancer.  Note is made of a recent interval rise in PSA that will need to be monitored closely.  DRE today reveals a 40 to 50 g gland generally firm but no focal induration or nodularity   PSA data: 03/2015  1.0 02/2018  1.51 06/2019  1.1 05/2021  1.35 12/2022  2.48   Past Medical History:  Past Medical History:  Diagnosis Date   Arthritis    hands and feet   Atrial fib/flutter, transient (HCC)    patient takes  tykosyn   Cutaneous skin tags 01/09/2022   Dysrhythmia    a-fib   Elevated LFTs    Hearing aid worn 2021   right   Hyperlipidemia    Persistent atrial fibrillation (HCC)    RLS (restless legs syndrome)    Sleep apnea    does not use CPAP    Past Surgical History:  Past Surgical History:  Procedure Laterality Date   ATRIAL FIBRILLATION ABLATION  2015   BONE EXCISION Right 05/09/2021   Procedure: RIGHT TIBIA EXCISION;  Surgeon: Bjorn Pippin, MD;  Location: [facility];  Service: Orthopedics;  Laterality: Right;   COLONOSCOPY     EXCISION MORTON'S NEUROMA Right 05/09/2021   Procedure: EXCISION OF RIGHT SECOND TOE MASS;  Surgeon: Terance Hart, MD;  Location:  SURGERY CENTER;  Service: Orthopedics;  Laterality: Right;   FOOT SURGERY Left    HERNIA REPAIR     LUMBAR DISC SURGERY  2012   L3-L4, No hardware   metatarsal fusion     right large toe    Allergies:  Allergies  Allergen Reactions   Sulfa Antibiotics     Unknown reaction in childhood    Family History:  Family History  Problem Relation Age of Onset   Diabetes Mother    Diabetes Brother    Early death Brother    Heart disease Brother    Colon cancer  Neg Hx    Esophageal cancer Neg Hx    Rectal cancer Neg Hx    Stomach cancer Neg Hx    Liver disease Neg Hx    Pancreatic cancer Neg Hx    Colon polyps Neg Hx     Social History:  Social History   Tobacco Use   Smoking status: Never   Smokeless tobacco: Former    Types: Engineer, drilling   Vaping status: Never Used  Substance Use Topics   Alcohol use: Yes    Alcohol/week: 10.0 standard drinks of alcohol    Types: 10 Cans of beer per week    Comment: social   Drug use: No    Review of symptoms:  Constitutional:  Negative for unexplained weight loss, night sweats, fever, chills ENT:  Negative for nose bleeds, sinus pain, painful swallowing CV:  Negative for chest pain, shortness of breath, exercise intolerance,  palpitations, loss of consciousness Resp:  Negative for cough, wheezing, shortness of breath GI:  Negative for nausea, vomiting, diarrhea, bloody stools GU:  Positives noted in HPI; otherwise negative for gross hematuria, dysuria, urinary incontinence Neuro:  Negative for seizures, poor balance, limb weakness, slurred speech Psych:  Negative for lack of energy, depression, anxiety Endocrine:  Negative for polydipsia, polyuria, symptoms of hypoglycemia (dizziness, hunger, sweating) Hematologic:  Negative for anemia, purpura, petechia, prolonged or excessive bleeding, use of anticoagulants  Allergic:  Negative for difficulty breathing or choking as a result of exposure to anything; no shellfish allergy; no allergic response (rash/itch) to materials, foods  Physical exam: BP (!) 157/88   Pulse (!) 59   Ht 6\' 1"  (1.854 m)   Wt 235 lb (106.6 kg)   BMI 31.00 kg/m  GENERAL APPEARANCE:  Well appearing, well developed, well nourished, NAD  GU: Normal external genitalia DRE today reveals a 40 to 50 g gland generally firm but no focal induration or nodularity  Results: UA is entirely negative

## 2023-10-05 NOTE — Assessment & Plan Note (Signed)
History of obstructive sleep apnea with prescription for CPAP which she has not yet started.

## 2023-10-05 NOTE — Assessment & Plan Note (Signed)
History of PAF status post A-fib ablation by Dr. Ronn Melena at [facility] remotely.  He said several cardioversions since.  His last episode of PAF was approximately 7 months ago that lasted approximately 2 hours.  He is not on oral anticoagulation.

## 2023-10-05 NOTE — Assessment & Plan Note (Signed)
History of hyperlipidemia on statin therapy with lipid profile performed 01/19/2023 revealing a total cholesterol of 176, LDL 104 and HDL 56.

## 2023-10-08 ENCOUNTER — Telehealth: Payer: Self-pay | Admitting: *Deleted

## 2023-10-08 ENCOUNTER — Telehealth: Payer: Self-pay | Admitting: Family Medicine

## 2023-10-08 DIAGNOSIS — I4821 Permanent atrial fibrillation: Secondary | ICD-10-CM

## 2023-10-08 DIAGNOSIS — G4700 Insomnia, unspecified: Secondary | ICD-10-CM

## 2023-10-08 DIAGNOSIS — G4733 Obstructive sleep apnea (adult) (pediatric): Secondary | ICD-10-CM

## 2023-10-08 NOTE — Telephone Encounter (Signed)
The patient has been notified of the result and verbalized understanding.  All questions (if any) were answered. Latrelle Dodrill, CMA 10/08/2023 4:35 PM    Per Dr Tresa Endo: recommend therapeutic CPAP for treatment of his sleep disordered breathing. Can initiate a trial of Auto-PAP therapy with EPR of 3 at 7 - 18 cm of water.  DME selection is [facility] Patient understands he will be contacted by [facility] to set up his cpap. Patient understands to call if [facility] does not contact him with new setup in a timely manner. Patient understands they will be called once confirmation has been received from [place] that they have received their new machine to schedule 10 week follow up appointment.   [facility] notified of new cpap order  Please add to airview Patient was grateful for the call and thanked me

## 2023-10-08 NOTE — Telephone Encounter (Signed)
-----   Message from Nurse Blima Singer sent at 10/05/2023  4:30 PM EDT ----- Johnathan [name], Can you look into this pt? He had a home sleep study on 9/13 and recommendations were made for cpap and there is no notes or follow up afterwards, how do we get his CPAP authorized and ordered?? I sent a message to Brandie but I didn't realize she is on PAL all week.   Thank you!

## 2023-10-08 NOTE — Telephone Encounter (Signed)
Spoke with patient regarding results/recommendations.   Advised pt to schedule with our new provider in office or with PCP next week. Pt declined scheduling at this time

## 2023-10-08 NOTE — Telephone Encounter (Signed)
Patient is requesting another round of predniSONE (DELTASONE) 20 MG tablet  due to foot still being swollen or should he take something else. Please give the patient a call to advise.

## 2023-10-21 ENCOUNTER — Other Ambulatory Visit: Payer: Self-pay | Admitting: Family Medicine

## 2023-10-23 ENCOUNTER — Encounter: Payer: Self-pay | Admitting: Podiatry

## 2023-10-23 ENCOUNTER — Ambulatory Visit (INDEPENDENT_AMBULATORY_CARE_PROVIDER_SITE_OTHER): Payer: 59 | Admitting: Podiatry

## 2023-10-23 DIAGNOSIS — Z981 Arthrodesis status: Secondary | ICD-10-CM | POA: Diagnosis not present

## 2023-10-23 NOTE — Progress Notes (Unsigned)
Subjective:  Patient ID: Johnathan [name], male    DOB: Jun 10, 1956,  MRN: [id number]  Chief Complaint  Patient presents with   Foot Pain    RM# 6 8//15/2024 right big toe fused patient wanting a second opinion patient is on antibiotics currently swelling still in pain    67 y.o. male presents with the above complaint.  Patient presents with complaint of right first metatarsophalangeal joint pain and swelling.  Patient had refused by Dr. Susa Simmonds on 08/13/2023.  He wanted to get it evaluated to make sure that he is doing okay.  He is still having some pain and swelling.  He denies any other acute complaint is not a diabetic.   Review of Systems: Negative except as noted in the HPI. Denies N/V/F/Ch.  Past Medical History:  Diagnosis Date   Arthritis    hands and feet   Atrial fib/flutter, transient (HCC)    patient takes tykosyn   Cutaneous skin tags 01/09/2022   Dysrhythmia    a-fib   Elevated LFTs    Hearing aid worn 2021   right   Hyperlipidemia    Persistent atrial fibrillation (HCC)    RLS (restless legs syndrome)    Sleep apnea    does not use CPAP    Current Outpatient Medications:    allopurinol (ZYLOPRIM) 300 MG tablet, Take 1 tablet (300 mg total) by mouth daily., Disp: 90 tablet, Rfl: 3   atorvastatin (LIPITOR) 40 MG tablet, Take 1 tablet (40 mg total) by mouth daily., Disp: 90 tablet, Rfl: 3   dofetilide (TIKOSYN) 500 MCG capsule, Take 1 capsule (500 mcg total) by mouth 2 (two) times daily., Disp: 180 capsule, Rfl: 3   escitalopram (LEXAPRO) 5 MG tablet, Take 1 tablet (5 mg total) by mouth daily., Disp: 90 tablet, Rfl: 3   eszopiclone (LUNESTA) 2 MG TABS tablet, Take 1 tablet (2 mg total) by mouth at bedtime as needed for sleep. Take immediately before bedtime, Disp: 30 tablet, Rfl: 5   predniSONE (DELTASONE) 20 MG tablet, 60 mg x3d, 40 mg x3d, 20 mg x2d, 10 mg x2d, Disp: 18 tablet, Rfl: 0   silodosin (RAPAFLO) 8 MG CAPS capsule, Take 1 capsule (8 mg total) by mouth daily  with breakfast., Disp: 90 capsule, Rfl: 3  Social History   Tobacco Use  Smoking Status Never  Smokeless Tobacco Former   Types: Chew    Allergies  Allergen Reactions   Sulfa Antibiotics     Unknown reaction in childhood   Objective:  There were no vitals filed for this visit. There is no height or weight on file to calculate BMI. Constitutional Well developed. Well nourished.  Vascular Dorsalis pedis pulses palpable bilaterally. Posterior tibial pulses palpable bilaterally. Capillary refill normal to all digits.  No cyanosis or clubbing noted. Pedal hair growth normal.  Neurologic Normal speech. Oriented to person, place, and time. Epicritic sensation to light touch grossly present bilaterally.  Dermatologic Nails well groomed and normal in appearance. No open wounds. No skin lesions.  Orthopedic: Pain on palpation to the first metatarsophalangeal joint.  No range of motion noted at the first MPJ.  No open wounds or lesion noted no signs of infection noted.   Radiographs: IMPRESSION: 1. Postoperative changes involving the first MTP joint with the fusion changes. Significant artifact associated with the hardware. 2. Multiple small fluid collections surrounding the first MTP joint. These are indeterminate but somewhat worrisome. Recommend follow-up study with contrast for further evaluation.. 3. Edema like signal changes  in both sesamoid bones the great toe, possibly reactive. 4. Moderate T2 signal abnormality in the bases of the first and second metatarsals possibly degenerative or reactive. 5. Inflammation/edema in the foot musculature suggesting nonspecific myositis. No findings for pyomyositi  Assessment:  No diagnosis found. Plan:  Patient was evaluated and treated and all questions answered.  *** -  No follow-ups on file.   Right first MPJ fusion by Dr. Susa Simmonds looking good overall

## 2023-12-04 ENCOUNTER — Ambulatory Visit: Payer: 59 | Admitting: Podiatry

## 2024-01-10 ENCOUNTER — Other Ambulatory Visit: Payer: Self-pay | Admitting: Family Medicine

## 2024-01-13 ENCOUNTER — Encounter: Payer: Self-pay | Admitting: Podiatry

## 2024-01-13 ENCOUNTER — Ambulatory Visit: Payer: 59 | Admitting: Podiatry

## 2024-01-13 DIAGNOSIS — Z981 Arthrodesis status: Secondary | ICD-10-CM | POA: Diagnosis not present

## 2024-01-13 NOTE — Progress Notes (Signed)
Subjective:  Patient ID: Johnathan [name], male    DOB: 08-16-1956,  MRN: [id number]  Chief Complaint  Patient presents with   Foot Pain    RM#11 6 week follow-up from 10/23/2023 visit patient states not hurting as much.    68 y.o. male presents with the above complaint.  Patient presents with complaint of right first metatarsophalangeal joint pain and swelling.  Patient had refused by Dr. Susa Simmonds on 08/13/2023.  He states is doing a lot better he does not have any swelling or pain.  He is able to manage it.   Review of Systems: Negative except as noted in the HPI. Denies N/V/F/Ch.  Past Medical History:  Diagnosis Date   Arthritis    hands and feet   Atrial fib/flutter, transient (HCC)    patient takes tykosyn   Cutaneous skin tags 01/09/2022   Dysrhythmia    a-fib   Elevated LFTs    Hearing aid worn 2021   right   Hyperlipidemia    Persistent atrial fibrillation (HCC)    RLS (restless legs syndrome)    Sleep apnea    does not use CPAP    Current Outpatient Medications:    allopurinol (ZYLOPRIM) 300 MG tablet, Take 1 tablet (300 mg total) by mouth daily., Disp: 90 tablet, Rfl: 3   atorvastatin (LIPITOR) 40 MG tablet, Take 1 tablet (40 mg total) by mouth daily., Disp: 90 tablet, Rfl: 3   dofetilide (TIKOSYN) 500 MCG capsule, Take 1 capsule (500 mcg total) by mouth 2 (two) times daily., Disp: 180 capsule, Rfl: 3   escitalopram (LEXAPRO) 5 MG tablet, TAKE 1 TABLET (5 MG TOTAL) BY MOUTH DAILY., Disp: 30 tablet, Rfl: 0   eszopiclone (LUNESTA) 2 MG TABS tablet, Take 1 tablet (2 mg total) by mouth at bedtime as needed for sleep. Take immediately before bedtime, Disp: 30 tablet, Rfl: 5   predniSONE (DELTASONE) 20 MG tablet, 60 mg x3d, 40 mg x3d, 20 mg x2d, 10 mg x2d, Disp: 18 tablet, Rfl: 0   silodosin (RAPAFLO) 8 MG CAPS capsule, Take 1 capsule (8 mg total) by mouth daily with breakfast., Disp: 90 capsule, Rfl: 3  Social History   Tobacco Use  Smoking Status Never  Smokeless Tobacco  Former   Types: Chew    Allergies  Allergen Reactions   Sulfa Antibiotics     Unknown reaction in childhood   Objective:  There were no vitals filed for this visit. There is no height or weight on file to calculate BMI. Constitutional Well developed. Well nourished.  Vascular Dorsalis pedis pulses palpable bilaterally. Posterior tibial pulses palpable bilaterally. Capillary refill normal to all digits.  No cyanosis or clubbing noted. Pedal hair growth normal.  Neurologic Normal speech. Oriented to person, place, and time. Epicritic sensation to light touch grossly present bilaterally.  Dermatologic Nails well groomed and normal in appearance. No open wounds. No skin lesions.  Orthopedic: No further pain on palpation to the first metatarsophalangeal joint.  No range of motion noted at the first MPJ.  No open wounds or lesion noted no signs of infection noted.   Radiographs: IMPRESSION: 1. Postoperative changes involving the first MTP joint with the fusion changes. Significant artifact associated with the hardware. 2. Multiple small fluid collections surrounding the first MTP joint. These are indeterminate but somewhat worrisome. Recommend follow-up study with contrast for further evaluation.. 3. Edema like signal changes in both sesamoid bones the great toe, possibly reactive. 4. Moderate T2 signal abnormality in the bases of  the first and second metatarsals possibly degenerative or reactive. 5. Inflammation/edema in the foot musculature suggesting nonspecific myositis. No findings for pyomyositi  Assessment:   1. H/O arthrodesis     Plan:  Patient was evaluated and treated and all questions answered.  Right first metatarsophalangeal joint fusion healing well -Clinically healed no further signs of infection noted.  At this time if any foot and ankle issues arise in future he will come back and see me.  He states understanding

## 2024-01-29 ENCOUNTER — Other Ambulatory Visit: Payer: Self-pay | Admitting: Family Medicine

## 2024-02-02 ENCOUNTER — Other Ambulatory Visit: Payer: Self-pay | Admitting: Family Medicine

## 2024-02-03 ENCOUNTER — Other Ambulatory Visit: Payer: 59

## 2024-02-03 ENCOUNTER — Other Ambulatory Visit: Payer: Self-pay

## 2024-02-03 DIAGNOSIS — R972 Elevated prostate specific antigen [PSA]: Secondary | ICD-10-CM

## 2024-02-04 LAB — PSA: Prostate Specific Ag, Serum: 2.2 ng/mL (ref 0.0–4.0)

## 2024-02-06 ENCOUNTER — Other Ambulatory Visit: Payer: Self-pay | Admitting: Family Medicine

## 2024-02-07 ENCOUNTER — Other Ambulatory Visit: Payer: Self-pay | Admitting: Family Medicine

## 2024-02-10 ENCOUNTER — Ambulatory Visit: Payer: 59 | Admitting: Urology

## 2024-02-12 ENCOUNTER — Ambulatory Visit: Payer: 59 | Admitting: Urology

## 2024-02-12 ENCOUNTER — Ambulatory Visit
Admission: EM | Admit: 2024-02-12 | Discharge: 2024-02-12 | Disposition: A | Discharge status: Home / Self Care | Payer: 59 | Attending: Family Medicine | Admitting: Family Medicine

## 2024-02-12 ENCOUNTER — Encounter: Payer: Self-pay | Admitting: Urology

## 2024-02-12 VITALS — BP 131/86 | HR 73 | Ht 73.0 in | Wt 230.0 lb

## 2024-02-12 DIAGNOSIS — B349 Viral infection, unspecified: Secondary | ICD-10-CM | POA: Diagnosis not present

## 2024-02-12 DIAGNOSIS — N401 Enlarged prostate with lower urinary tract symptoms: Secondary | ICD-10-CM

## 2024-02-12 LAB — URINALYSIS, ROUTINE W REFLEX MICROSCOPIC
Bilirubin, UA: NEGATIVE
Glucose, UA: NEGATIVE
Ketones, UA: NEGATIVE
Leukocytes,UA: NEGATIVE
Nitrite, UA: NEGATIVE
Protein,UA: NEGATIVE
RBC, UA: NEGATIVE
Specific Gravity, UA: 1.015 (ref 1.005–1.030)
Urobilinogen, Ur: 0.2 mg/dL (ref 0.2–1.0)
pH, UA: 6.5 (ref 5.0–7.5)

## 2024-02-12 LAB — POC COVID19/FLU A&B COMBO
Covid Antigen, POC: NEGATIVE
Influenza A Antigen, POC: NEGATIVE
Influenza B Antigen, POC: NEGATIVE

## 2024-02-12 MED ORDER — ACETAMINOPHEN 325 MG PO TABS: 650.0000 mg | ORAL_TABLET | Freq: Four times a day (QID) | ORAL | 0 refills | Status: DC | PRN

## 2024-02-12 MED ORDER — PROMETHAZINE-DM 6.25-15 MG/5ML PO SYRP: 5.0000 mL | ORAL_SOLUTION | Freq: Three times a day (TID) | ORAL | 0 refills | Status: DC | PRN

## 2024-02-12 MED ORDER — CETIRIZINE HCL 10 MG PO TABS: 10.0000 mg | ORAL_TABLET | Freq: Every day | ORAL | 0 refills | Status: DC

## 2024-02-12 NOTE — Discharge Instructions (Addendum)
We will manage this as a viral syndrome. For sore throat or cough try using a honey-based tea. Use 3 teaspoons of honey with juice squeezed from half lemon. Place shaved pieces of ginger into 1/2-1 cup of water and warm over stove top. Then mix the ingredients and repeat every 4 hours as needed. Please take Tylenol 500mg -650mg  once every 6 hours for fevers, aches and pains. Hydrate very well with at least 2 liters (64 ounces) of water. Eat light meals such as soups (chicken and noodles, chicken wild rice, vegetable).  Do not eat any foods that you are allergic to.  Start an antihistamine like Zyrtec (10mg  daily) for postnasal drainage, sinus congestion.  You can take this together with cough syrup as needed.

## 2024-02-12 NOTE — ED Provider Notes (Signed)
[facility]  Note:  This document was prepared using Conservation officer, historic buildings and may include unintentional dictation errors.  MRN: [id number] DOB: Jul 21, 1956  Subjective:   Johnathan [name] is a 68 y.o. male presenting for 1 day history of acute onset sinus congestion, chest congestion, coughing, malaise and fatigue. No chest pain, shob, wheezing. No asthma. No smoking of any kind including cigarettes, cigars, vaping, marijuana use.  Would like a COVID flu test.  No current facility-administered medications for this encounter.  Current Outpatient Medications:    colchicine 0.6 MG tablet, Take 0.6 mg by mouth daily., Disp: , Rfl:    allopurinol (ZYLOPRIM) 300 MG tablet, Take 1 tablet (300 mg total) by mouth daily., Disp: 90 tablet, Rfl: 3   atorvastatin (LIPITOR) 40 MG tablet, Take 1 tablet (40 mg total) by mouth daily., Disp: 90 tablet, Rfl: 3   dofetilide (TIKOSYN) 500 MCG capsule, Take 1 capsule (500 mcg total) by mouth 2 (two) times daily., Disp: 180 capsule, Rfl: 3   escitalopram (LEXAPRO) 5 MG tablet, TAKE 1 TABLET (5 MG TOTAL) BY MOUTH DAILY., Disp: 30 tablet, Rfl: 0   eszopiclone (LUNESTA) 2 MG TABS tablet, Take 1 tablet (2 mg total) by mouth at bedtime as needed for sleep. Take immediately before bedtime, Disp: 30 tablet, Rfl: 5   predniSONE (DELTASONE) 20 MG tablet, 60 mg x3d, 40 mg x3d, 20 mg x2d, 10 mg x2d (Patient not taking: Reported on 02/12/2024), Disp: 18 tablet, Rfl: 0   silodosin (RAPAFLO) 8 MG CAPS capsule, Take 1 capsule (8 mg total) by mouth daily with breakfast. (Patient not taking: Reported on 02/12/2024), Disp: 90 capsule, Rfl: 3   Allergies  Allergen Reactions   Sulfa Antibiotics     Unknown reaction in childhood    Past Medical History:  Diagnosis Date   Arthritis    hands and feet   Atrial fib/flutter, transient (HCC)    patient takes tykosyn   Cutaneous skin tags 01/09/2022   Dysrhythmia    a-fib   Elevated LFTs    Hearing  aid worn 2021   right   Hyperlipidemia    Persistent atrial fibrillation (HCC)    RLS (restless legs syndrome)    Sleep apnea    does not use CPAP     Past Surgical History:  Procedure Laterality Date   ATRIAL FIBRILLATION ABLATION  2015   BONE EXCISION Right 05/09/2021   Procedure: RIGHT TIBIA EXCISION;  Surgeon: Bjorn Pippin, MD;  Location: [facility];  Service: Orthopedics;  Laterality: Right;   COLONOSCOPY     EXCISION MORTON'S NEUROMA Right 05/09/2021   Procedure: EXCISION OF RIGHT SECOND TOE MASS;  Surgeon: Terance Hart, MD;  Location: [facility];  Service: Orthopedics;  Laterality: Right;   FOOT SURGERY Left    HERNIA REPAIR     LUMBAR DISC SURGERY  2012   L3-L4, No hardware   metatarsal fusion     right large toe    Family History  Problem Relation Age of Onset   Diabetes Mother    Diabetes Brother    Early death Brother    Heart disease Brother    Colon cancer Neg Hx    Esophageal cancer Neg Hx    Rectal cancer Neg Hx    Stomach cancer Neg Hx    Liver disease Neg Hx    Pancreatic cancer Neg Hx    Colon polyps Neg Hx  Social History   Tobacco Use   Smoking status: Never   Smokeless tobacco: Former    Types: Associate Professor status: Never Used  Substance Use Topics   Alcohol use: Yes   Drug use: No    ROS   Objective:   Vitals: BP 123/78 (BP Location: Left Arm)   Pulse 77   Temp 99.7 F (37.6 C) (Oral)   Resp 16   SpO2 96%   Physical Exam Constitutional:      General: He is not in acute distress.    Appearance: Normal appearance. He is well-developed and normal weight. He is not ill-appearing, toxic-appearing or diaphoretic.  HENT:     Head: Normocephalic and atraumatic.     Right Ear: Tympanic membrane, ear canal and external ear normal. No drainage, swelling or tenderness. No middle ear effusion. There is no impacted cerumen. Tympanic membrane is not erythematous or bulging.     Left  Ear: Tympanic membrane, ear canal and external ear normal. No drainage, swelling or tenderness.  No middle ear effusion. There is no impacted cerumen. Tympanic membrane is not erythematous or bulging.     Nose: Congestion and rhinorrhea present.     Mouth/Throat:     Mouth: Mucous membranes are moist.     Pharynx: No oropharyngeal exudate or posterior oropharyngeal erythema.  Eyes:     General: No scleral icterus.       Right eye: No discharge.        Left eye: No discharge.     Extraocular Movements: Extraocular movements intact.     Conjunctiva/sclera: Conjunctivae normal.  Cardiovascular:     Rate and Rhythm: Normal rate and regular rhythm.     Heart sounds: Normal heart sounds. No murmur heard.    No friction rub. No gallop.  Pulmonary:     Effort: Pulmonary effort is normal. No respiratory distress.     Breath sounds: Normal breath sounds. No stridor. No wheezing, rhonchi or rales.  Musculoskeletal:     Cervical back: Normal range of motion and neck supple. No rigidity. No muscular tenderness.  Neurological:     General: No focal deficit present.     Mental Status: He is alert and oriented to person, place, and time.  Psychiatric:        Mood and Affect: Mood normal.        Behavior: Behavior normal.        Thought Content: Thought content normal.     Results for orders placed or performed during the hospital encounter of 02/12/24 (from the past 24 hours)  POC Covid19/Flu A&B Antigen     Status: None   Collection Time: 02/12/24 11:27 AM  Result Value Ref Range   Influenza A Antigen, POC Negative Negative   Influenza B Antigen, POC Negative Negative   Covid Antigen, POC Negative Negative    Assessment and Plan :   PDMP not reviewed this encounter.  1. Acute viral syndrome    Deferred imaging given clear cardiopulmonary exam, hemodynamically stable vital signs. Suspect viral URI, viral syndrome. Physical exam findings reassuring and vital signs stable for discharge.  Advised supportive care, offered symptomatic relief. Counseled patient on potential for adverse effects with medications prescribed/recommended today, ER and return-to-clinic precautions discussed, patient verbalized understanding.     Wallis Bamberg, [place] 02/12/24 1129

## 2024-02-12 NOTE — Progress Notes (Signed)
Assessment: 1. Benign localized prostatic hyperplasia with lower urinary tract symptoms (LUTS)     Plan: Currently patient does not feel that his degree of bother considering his relatively mild symptoms warrant prescription medication.  I recommend that he continue with yearly PSA checks.  Follow-up as needed  Chief Complaint: luts  HPI: Johnathan [name] is a 68 y.o. male who presents for continued evaluation of LUTS. Please see my note 10/05/2023 at the time of initial visit for detailed history and exam which is summarized below.  At that time the patient was started on silodosin.  He previously tried tamsulosin but did not tolerate it well.  Patient reports that he elected not to start the silodosin.  Currently he reports that his lower urinary tract symptoms have improved.  Current IPSS = 6.  This is down from 10 at the time of his initial visit.   Patient reports over the last several years noticing some worsening lower urinary tract symptoms.  He admits to drinking a lot of water including about a liter before he goes to bed at night.     There is no family history of prostate cancer.  Note is made of a recent interval rise in PSA that will need to be monitored closely.   DRE today reveals a 40 to 50 g gland generally firm but no focal induration or nodularity     PSA data: 03/2015             1.0 02/2018             1.51 06/2019             1.1 05/2021             1.35 12/2022             2.48 01/2024  2.2  Portions of the above documentation were copied from a prior visit for review purposes only.  Allergies: Allergies  Allergen Reactions   Sulfa Antibiotics     Unknown reaction in childhood    PMH: Past Medical History:  Diagnosis Date   Arthritis    hands and feet   Atrial fib/flutter, transient (HCC)    patient takes tykosyn   Cutaneous skin tags 01/09/2022   Dysrhythmia    a-fib   Elevated LFTs    Hearing aid worn 2021   right   Hyperlipidemia    Persistent  atrial fibrillation (HCC)    RLS (restless legs syndrome)    Sleep apnea    does not use CPAP    PSH: Past Surgical History:  Procedure Laterality Date   ATRIAL FIBRILLATION ABLATION  2015   BONE EXCISION Right 05/09/2021   Procedure: RIGHT TIBIA EXCISION;  Surgeon: Bjorn Pippin, MD;  Location: [facility];  Service: Orthopedics;  Laterality: Right;   COLONOSCOPY     EXCISION MORTON'S NEUROMA Right 05/09/2021   Procedure: EXCISION OF RIGHT SECOND TOE MASS;  Surgeon: Terance Hart, MD;  Location:  SURGERY CENTER;  Service: Orthopedics;  Laterality: Right;   FOOT SURGERY Left    HERNIA REPAIR     LUMBAR DISC SURGERY  2012   L3-L4, No hardware   metatarsal fusion     right large toe    SH: Social History   Tobacco Use   Smoking status: Never   Smokeless tobacco: Former    Types: Engineer, drilling   Vaping status: Never Used  Substance Use Topics  Alcohol use: Yes    Alcohol/week: 10.0 standard drinks of alcohol    Types: 10 Cans of beer per week    Comment: social   Drug use: No    ROS: Constitutional:  Negative for fever, chills, weight loss CV: Negative for chest pain, previous MI, hypertension Respiratory:  Negative for shortness of breath, wheezing, sleep apnea, frequent cough GI:  Negative for nausea, vomiting, bloody stool, GERD  PE: BP 131/86   Pulse 73   Ht 6\' 1"  (1.854 m)   Wt 230 lb (104.3 kg)   BMI 30.34 kg/m  GENERAL APPEARANCE:  Well appearing, well developed, well nourished, NAD HEENT:  Atraumatic, normocephalic, oropharynx clear NECK:  Supple without lymphadenopathy or thyromegaly ABDOMEN:  Soft, non-tender, no masses EXTREMITIES:  Moves all extremities well, without clubbing, cyanosis, or edema NEUROLOGIC:  Alert and oriented x 3, normal gait, CN II-XII grossly intact MENTAL STATUS:  appropriate BACK:  Non-tender to palpation, No CVAT SKIN:  Warm, dry, and intact   Results: No results found for this or any  previous visit (from the past 24 hours).

## 2024-02-12 NOTE — ED Triage Notes (Signed)
Pt c/o cough/chest congestion started last night-denies fever-NAD-steady gait

## 2024-02-13 ENCOUNTER — Telehealth: Payer: Self-pay

## 2024-02-13 NOTE — Telephone Encounter (Signed)
Pt called clinic requesting abx-Mani, PA-C reviewed 2/14 chart-advised no abx at this time-spoke with pt and notified of PA-C advisement and to follow up with PCP or return to clinic prn-pt agreed

## 2024-02-14 ENCOUNTER — Ambulatory Visit (HOSPITAL_COMMUNITY): Payer: 59

## 2024-02-14 ENCOUNTER — Ambulatory Visit
Admission: EM | Admit: 2024-02-14 | Discharge: 2024-02-14 | Disposition: A | Discharge status: Home / Self Care | Payer: 59 | Attending: Family Medicine | Admitting: Family Medicine

## 2024-02-14 ENCOUNTER — Ambulatory Visit (INDEPENDENT_AMBULATORY_CARE_PROVIDER_SITE_OTHER): Payer: 59

## 2024-02-14 DIAGNOSIS — J101 Influenza due to other identified influenza virus with other respiratory manifestations: Secondary | ICD-10-CM | POA: Diagnosis not present

## 2024-02-14 DIAGNOSIS — R058 Other specified cough: Secondary | ICD-10-CM

## 2024-02-14 LAB — POC COVID19/FLU A&B COMBO
Covid Antigen, POC: NEGATIVE
Influenza A Antigen, POC: POSITIVE — AB
Influenza B Antigen, POC: NEGATIVE

## 2024-02-14 MED ORDER — ACETAMINOPHEN 325 MG PO TABS
650.0000 mg | ORAL_TABLET | Freq: Once | ORAL | Status: AC
  Administered 2024-02-14: 650 mg via ORAL

## 2024-02-14 MED ORDER — OSELTAMIVIR PHOSPHATE 75 MG PO CAPS
75.0000 mg | ORAL_CAPSULE | Freq: Two times a day (BID) | ORAL | 0 refills | Status: DC
Start: 2024-02-14 — End: 2024-03-02

## 2024-02-14 NOTE — ED Provider Notes (Signed)
[facility]  Note:  This document was prepared using Conservation officer, historic buildings and may include unintentional dictation errors.  MRN: [id number] DOB: 23-Mar-1956  Subjective:   Johnathan [name] is a 68 y.o. male presenting for recheck on persistent fevers and coughing.  Has used Tylenol.  Was seen 02/12/2024.  Tested negative for COVID and flu.  Symptoms started the day before.  No asthma.  Has atrial fibrillation but is not anticoagulated.  No smoking.  Has requested antibiotics.  No current facility-administered medications for this encounter.  Current Outpatient Medications:    oseltamivir (TAMIFLU) 75 MG capsule, Take 1 capsule (75 mg total) by mouth 2 (two) times daily., Disp: 10 capsule, Rfl: 0   acetaminophen (TYLENOL) 325 MG tablet, Take 2 tablets (650 mg total) by mouth every 6 (six) hours as needed for moderate pain (pain score 4-6)., Disp: 30 tablet, Rfl: 0   allopurinol (ZYLOPRIM) 300 MG tablet, Take 1 tablet (300 mg total) by mouth daily., Disp: 90 tablet, Rfl: 3   atorvastatin (LIPITOR) 40 MG tablet, Take 1 tablet (40 mg total) by mouth daily., Disp: 90 tablet, Rfl: 3   cetirizine (ZYRTEC ALLERGY) 10 MG tablet, Take 1 tablet (10 mg total) by mouth daily., Disp: 30 tablet, Rfl: 0   colchicine 0.6 MG tablet, Take 0.6 mg by mouth daily., Disp: , Rfl:    dofetilide (TIKOSYN) 500 MCG capsule, Take 1 capsule (500 mcg total) by mouth 2 (two) times daily., Disp: 180 capsule, Rfl: 3   escitalopram (LEXAPRO) 5 MG tablet, TAKE 1 TABLET (5 MG TOTAL) BY MOUTH DAILY., Disp: 30 tablet, Rfl: 0   eszopiclone (LUNESTA) 2 MG TABS tablet, Take 1 tablet (2 mg total) by mouth at bedtime as needed for sleep. Take immediately before bedtime, Disp: 30 tablet, Rfl: 5   predniSONE (DELTASONE) 20 MG tablet, 60 mg x3d, 40 mg x3d, 20 mg x2d, 10 mg x2d (Patient not taking: Reported on 02/12/2024), Disp: 18 tablet, Rfl: 0   promethazine-dextromethorphan (PROMETHAZINE-DM) 6.25-15 MG/5ML  syrup, Take 5 mLs by mouth 3 (three) times daily as needed for cough., Disp: 200 mL, Rfl: 0   silodosin (RAPAFLO) 8 MG CAPS capsule, Take 1 capsule (8 mg total) by mouth daily with breakfast. (Patient not taking: Reported on 02/12/2024), Disp: 90 capsule, Rfl: 3   Allergies  Allergen Reactions   Sulfa Antibiotics     Unknown reaction in childhood    Past Medical History:  Diagnosis Date   Arthritis    hands and feet   Atrial fib/flutter, transient (HCC)    patient takes tykosyn   Cutaneous skin tags 01/09/2022   Dysrhythmia    a-fib   Elevated LFTs    Hearing aid worn 2021   right   Hyperlipidemia    Persistent atrial fibrillation (HCC)    RLS (restless legs syndrome)    Sleep apnea    does not use CPAP     Past Surgical History:  Procedure Laterality Date   ATRIAL FIBRILLATION ABLATION  2015   BONE EXCISION Right 05/09/2021   Procedure: RIGHT TIBIA EXCISION;  Surgeon: Bjorn Pippin, MD;  Location: [facility];  Service: Orthopedics;  Laterality: Right;   COLONOSCOPY     EXCISION MORTON'S NEUROMA Right 05/09/2021   Procedure: EXCISION OF RIGHT SECOND TOE MASS;  Surgeon: Terance Hart, MD;  Location: [facility];  Service: Orthopedics;  Laterality: Right;   FOOT SURGERY Left    HERNIA REPAIR  LUMBAR DISC SURGERY  2012   L3-L4, No hardware   metatarsal fusion     right large toe    Family History  Problem Relation Age of Onset   Diabetes Mother    Diabetes Brother    Early death Brother    Heart disease Brother    Colon cancer Neg Hx    Esophageal cancer Neg Hx    Rectal cancer Neg Hx    Stomach cancer Neg Hx    Liver disease Neg Hx    Pancreatic cancer Neg Hx    Colon polyps Neg Hx     Social History   Tobacco Use   Smoking status: Never   Smokeless tobacco: Former    Types: Engineer, drilling   Vaping status: Never Used  Substance Use Topics   Alcohol use: Yes   Drug use: No    ROS   Objective:    Vitals: BP (!) 148/76 (BP Location: Right Arm)   Pulse 63   Temp (!) 100.4 F (38 C) (Oral)   Resp 18   SpO2 96%   Physical Exam Constitutional:      General: He is not in acute distress.    Appearance: Normal appearance. He is well-developed and normal weight. He is not ill-appearing, toxic-appearing or diaphoretic.  HENT:     Head: Normocephalic and atraumatic.     Right Ear: Tympanic membrane, ear canal and external ear normal. No drainage, swelling or tenderness. No middle ear effusion. There is no impacted cerumen. Tympanic membrane is not erythematous or bulging.     Left Ear: Tympanic membrane, ear canal and external ear normal. No drainage, swelling or tenderness.  No middle ear effusion. There is no impacted cerumen. Tympanic membrane is not erythematous or bulging.     Nose: Nose normal. No congestion or rhinorrhea.     Mouth/Throat:     Mouth: Mucous membranes are moist.     Pharynx: No oropharyngeal exudate or posterior oropharyngeal erythema.  Eyes:     General: No scleral icterus.       Right eye: No discharge.        Left eye: No discharge.     Extraocular Movements: Extraocular movements intact.     Conjunctiva/sclera: Conjunctivae normal.  Cardiovascular:     Rate and Rhythm: Normal rate and regular rhythm.     Heart sounds: Normal heart sounds. No murmur heard.    No friction rub. No gallop.  Pulmonary:     Effort: Pulmonary effort is normal. No respiratory distress.     Breath sounds: Normal breath sounds. No stridor. No wheezing, rhonchi or rales.  Musculoskeletal:     Cervical back: Normal range of motion and neck supple. No rigidity. No muscular tenderness.  Neurological:     General: No focal deficit present.     Mental Status: He is alert and oriented to person, place, and time.  Psychiatric:        Mood and Affect: Mood normal.        Behavior: Behavior normal.        Thought Content: Thought content normal.     Results for orders placed or  performed during the hospital encounter of 02/14/24 (from the past 24 hours)  POC Covid19/Flu A&B Antigen     Status: Abnormal   Collection Time: 02/14/24 12:13 PM  Result Value Ref Range   Influenza A Antigen, POC Positive (A) Negative   Influenza B Antigen, POC Negative Negative  Covid Antigen, POC Negative Negative    Assessment and Plan :   PDMP not reviewed this encounter.  1. Influenza A   2. Productive cough    X-ray over-read was pending at time of discharge, recommended follow up with only abnormal results. Otherwise will not call for negative over-read. Patient was in agreement.  Discussed antibiotic stewardship and ultimately patient was agreeable. Will cover for influenza with Tamiflu given + results.  Use supportive care, rest, fluids, hydration, light meals, schedule Tylenol and ibuprofen. Counseled patient on potential for adverse effects with medications prescribed today, patient verbalized understanding. ER and return-to-clinic precautions discussed, patient verbalized understanding.    Wallis Bamberg, [place] 02/14/24 1244

## 2024-02-14 NOTE — Discharge Instructions (Addendum)
We will manage this influenza infection with Tamiflu. For sore throat or cough try using a honey-based tea. Use 3 teaspoons of honey with juice squeezed from half lemon. Place shaved pieces of ginger into 1/2-1 cup of water and warm over stove top. Then mix the ingredients and repeat every 4 hours as needed. Please take Tylenol 500mg -650mg  once every 6 hours for fevers, aches and pains. Hydrate very well with at least 2 liters (64 ounces) of water. Eat light meals such as soups (chicken and noodles, chicken wild rice, vegetable).  Do not eat any foods that you are allergic to.  Start an antihistamine like Zyrtec (10mg  daily) for postnasal drainage, sinus congestion.  You can take this together with cough syrup as needed.

## 2024-02-14 NOTE — ED Triage Notes (Signed)
Pt reports fever 101.5 F, headache  and cough x 3 days. Pt taking Tylenol. Pt was seeing at the UC 2 days ago and did not picked up his prescriptions.

## 2024-02-17 ENCOUNTER — Other Ambulatory Visit: Payer: Self-pay | Admitting: Family Medicine

## 2024-02-17 MED ORDER — ESZOPICLONE 2 MG PO TABS: 2.0000 mg | ORAL_TABLET | Freq: Every evening | ORAL | 0 refills | Status: DC | PRN

## 2024-02-17 NOTE — Telephone Encounter (Signed)
Refill requested for Lunesta sent to [facility] on [address]. Last refilled 07/31/23. Next OV 03/04/24

## 2024-02-17 NOTE — Telephone Encounter (Addendum)
Appears pt may be establishing with [facility] on 03/03/24. 30 d/s pending. Accidental print has been discontinued

## 2024-02-17 NOTE — Telephone Encounter (Signed)
Copied from CRM [id number]. Topic: Clinical - Medication Refill >> Feb 17, 2024  9:29 AM Lennart Pall wrote: Most Recent Primary Care Visit:  Provider: Felix Pacini A  Department: [facility]  Visit Type: OFFICE VISIT  Date: 09/28/2023  Medication: eszopiclone (LUNESTA) 2 MG TABS tablet  Has the patient contacted their pharmacy? Yes (Agent: If no, request that the patient contact the pharmacy for the refill. If patient does not wish to contact the pharmacy document the reason why and proceed with request.) (Agent: If yes, when and what did the pharmacy advise?)  Is this the correct pharmacy for this prescription? Yes If no, delete pharmacy and type the correct one.  This is the patient's preferred pharmacy:  [id number] - [place], [place] - [address] AT [address] OF [address] [place] [address] Phone: [phone] Fax: [phone]   Has the prescription been filled recently? Yes  Is the patient out of the medication? Yes  Has the patient been seen for an appointment in the last year OR does the patient have an upcoming appointment? Yes  Can we respond through MyChart? Yes  Agent: Please be advised that Rx refills may take up to 3 business days. We ask that you follow-up with your pharmacy.

## 2024-02-18 MED ORDER — ESZOPICLONE 2 MG PO TABS: 2.0000 mg | ORAL_TABLET | Freq: Every evening | ORAL | 0 refills | Status: DC | PRN

## 2024-02-18 NOTE — Telephone Encounter (Signed)
Received refill request for lunesta.  Pt last seen > 6 months ago, asking for refill on controlled substance.  Reviewed [place] database and it is appropriate.  Will provide a courtesy refill #30 tabs of his lunesta script to allow him time to est with new pcp 03/03/2024

## 2024-03-01 ENCOUNTER — Telehealth: Payer: Self-pay | Admitting: Physician Assistant

## 2024-03-01 NOTE — Telephone Encounter (Signed)
 Patient paged after hours answering service says he likely went out of rhythm.  Current heart rate is about the 100 bpm.  He is not very symptomatic.  He has baseline resting heart rate is about 50 bpm.  He is on Tikosyn however usually do not take anticoagulation therapy.  Since he feel he may be out of rhythm, he has restarted on the Xarelto.  I asked him to call our office first thing in the morning to arrange a earlier follow-up.

## 2024-03-02 ENCOUNTER — Ambulatory Visit: Attending: Physician Assistant | Admitting: Physician Assistant

## 2024-03-02 ENCOUNTER — Encounter: Payer: Self-pay | Admitting: Physician Assistant

## 2024-03-02 ENCOUNTER — Telehealth: Payer: Self-pay

## 2024-03-02 VITALS — BP 138/80 | HR 106 | Ht 73.0 in | Wt 228.4 lb

## 2024-03-02 DIAGNOSIS — I484 Atypical atrial flutter: Secondary | ICD-10-CM

## 2024-03-02 DIAGNOSIS — I4892 Unspecified atrial flutter: Secondary | ICD-10-CM | POA: Diagnosis not present

## 2024-03-02 MED ORDER — RIVAROXABAN 20 MG PO TABS
20.0000 mg | ORAL_TABLET | Freq: Every day | ORAL | 11 refills | Status: AC
Start: 2024-03-02 — End: ?

## 2024-03-02 NOTE — Telephone Encounter (Signed)
 Patient identification verified by 2 forms. Marilynn Rail, RN    Called and spoke to patient  Patient scheduled for OV 3/5 at 10:05 am with PA Lalla Brothers  Patient agrees, no questions at this time

## 2024-03-02 NOTE — Telephone Encounter (Signed)
 I raised concern about pt restarting Xarelto via hhis AVS. I asked Juanda Crumble,  PA. Received the below message: Yes, finishing note now. I spoke with Dr. Izora Ribas and Dr. Antoine Poche. Patient has a long history of A-fib/flutter. With a CHA2DS2-VASc score of 1, he does not take maintenance Xarelto. His symptoms started yesterday at 5:30 PM. He immediately took Xarelto and took another dose today. Dr. Izora Ribas said he will plan on cardioversion only but will talk to patient tomorrow about risk without TEE. I think the patient would be fine with either DCCV or DCCV with TEE. Patient is very aware of symptoms. Recommend he taking Xarelto 20mg  daily long term & requested f/u with A-fib clinic post DCCV.   Pt scheduled for cardioversion 03/03/24 with Dr. Izora Ribas

## 2024-03-02 NOTE — Progress Notes (Signed)
 Cardiology Office Note   Date:  03/02/2024  ID:  Johnathan [name], DOB January 31, 1956, MRN [id number] PCP:  Natalia Leatherwood, DO [facility] Cardiologist: Nanetta Batty, MD  Reason for visit: A-fib/flutter, palpitations  History of Present Illness    Johnathan [name] is a 68 y.o. male with a hx of atrial fibrillation, premature family history of CAD (brother died from MI at age 73), hyperlipidemia, OSA.  Dr. Allyson Sabal mention history of A-fib ablation at [facility] over 8 years ago with Dr. Ronn Melena.  History of 4 or 5 cardioversions since the ablation.  The most recent cardioversion I see in Epic is a visit to [facility] in May 2017.  Patient was in atrial flutter with heart rate 110.  He had a DCCV with 200 J which converted him to sinus bradycardia heart rate 52.  Seen by Dr. Constance Haw.  He was last seen by Dr. Allyson Sabal in October 2024.  At tthat appointment, patient mention 1 brief episode of PAF which was self-limited (lasted 2 hours), continued on Tikosyn.  Patient was very active and exercises.  He was not on anticoagulation.  Patient was prescribed CPAP but does not use it.  Recommended follow-up in 1 year.  To note, patient went to the ED February 14, 2024 with persistent fevers and coughing.  With positive flu A, he was given Tamiflu.  Patient called our office yesterday.  Patient thought he was out of rhythm with heart rate around 100.  Stated his baseline heart rate was 50.  Since patient thought he was out of rhythm, he restarted Xarelto.  Today, patient recounts that he dealt with flu 3 weeks ago and flu symptoms resolved.  He mentions over the last 2 to 3 days, he felt head pressure, runny nose, slight cough and tiredness.  He went walking yesterday and noticed his heart racing and sweatiness similar to when he has been out of rhythm in the past.  He went back in the house and checked his heart rate at 5:30 PM, he noted his heart rate 100-106.  He took metoprolol 25 mg and Xarelto 20 mg.  He  states usually he self converts by the next day.  Since he remained out of rhythm, he came for appointment today.  Now, he feels occasional palpitations, sweatiness and fatigue.  He denies chest pain, shortness of breath, lightheadedness and lower extremity edema.  He took another Xarelto 20 mg this morning.  He states he is not completely opposed to taking maintenance anticoagulation and was told about a year ago that since he is now over 31, it is worth further conversation.  His wife asked about bleeding risk with significant injury.     Objective / Physical Exam   EKG today: 2-1 atrial flutter, heart rate 102  Vital signs:  BP 138/80 (BP Location: Left Arm, Patient Position: Sitting, Cuff Size: Normal)   Pulse (!) 106   Ht 6\' 1"  (1.854 m)   Wt 228 lb 6.4 oz (103.6 kg)   SpO2 98%   BMI 30.13 kg/m     GEN: No acute distress NECK: No carotid bruits CARDIAC: Irregular irregular, no murmurs RESPIRATORY:  Clear to auscultation without rales, wheezing or rhonchi  EXTREMITIES: No edema  Assessment and Plan   Paroxysmal atrial fibrillation/flutter -History of remote A-fib ablation at [facility] with Dr. Delena Serve; history of cardioversions -His last cardioversion was in May 2017 at [facility], presented with atrial flutter heart 110, cardioverted with 200 J. -Treated with Tikosyn 500  mg twice daily; QTc in normal range. -Patient had normal kidney function potassium on CMET August 2024; check BMET today -CHA2DS2-VASc score 1 for his age. -Recommending he continue Xarelto 20 mg daily for at least 4 weeks post cardioversion.  I recommend he take Xarelto long-term for stroke prevention given his good health and no history of bleeding.  Xarelto refilled today. -Okay to take metoprolol 25 mg when he gets home and another dose tonight if needed.  Recommend avoiding metoprolol tomorrow given his baseline heart rate in the 50s -to avoid post cardioversion bradycardia. -Follow-up with the A-fib clinic 2 weeks post  cardioversion.  Follow-up anticoagulation maintenance plans and potential a-flutter ablation. -Orders for cardioversion placed.  Discussed with DOD Dr. Antoine Poche and physician on schedule for cardioversions tomorrow, Dr. Izora Ribas.   -Dr. Izora Ribas will discuss risk of not performing TEE precardioversion & can add TEE if recommended.  Since patient took anticoagulation without 6 hours of a flutter symptoms, TEE not absolutely required. -Patient has a diagnosis of sleep apnea but does not use CPAP.  Discussed how untreated sleep apnea is a risk factor for recurrent atrial arrhythmias.  Disposition - Follow-up in 2 weeks with A-fib clinic.   Signed, Cannon Kettle, PA-C  03/02/2024 [facility]

## 2024-03-02 NOTE — Progress Notes (Addendum)
 Called patient and left message with pre-procedure instructions for tomorrow.   Patient informed of:   Time to arrive for procedure. 1200 Remain NPO past midnight.  Must have a ride home and a responsible adult to remain with them for 24 hours post procedure.  Confirmed blood thinner. Confirmed no breaks in taking blood thinner for 3+ weeks prior to procedure. Confirmed patient stopped all GLP-1s and GLP-2s for at least one week before procedure.

## 2024-03-02 NOTE — Telephone Encounter (Signed)
 Pt called in to sch. There is no opening with any APP until 03/08/24. Pt states he was told he just needs an EKG. Please advise if this needs to be a nurse visit.

## 2024-03-02 NOTE — Patient Instructions (Addendum)
 Medication Instructions:  Restart: Xarelto 20 mg ( Take 1 Tablet Daily). *If you need a refill on your cardiac medications before your next appointment, please call your pharmacy*   Lab Work: BMET, CBC Today If you have labs (blood work) drawn today and your tests are completely normal, you will receive your results only by: MyChart Message (if you have MyChart) OR A paper copy in the mail If you have any lab test that is abnormal or we need to change your treatment, we will call you to review the results.   Testing/Procedures:  You are scheduled for a on 03/03/2024  with Dr. Izora Ribas.  Please arrive at the [facility] (Main Entrance A) at [facility]: [address] [place], [place] [address] at 12 pm (This time is 1 hour(s) before your procedure to ensure your preparation).   Free valet parking service is available. You will check in at ADMITTING.   *Please Note: You will receive a call the day before your procedure to confirm the appointment time. That time may have changed from the original time based on the schedule for that day.*    DIET:  Nothing to eat or drink after midnight except a sip of water with medications (see medication instructions below)  MEDICATION INSTRUCTIONS: !!IF ANY NEW MEDICATIONS ARE STARTED AFTER TODAY, PLEASE NOTIFY YOUR PROVIDER AS SOON AS POSSIBLE!!  FYI: Medications such as Semaglutide (Ozempic, [place]), Tirzepatide (Mounjaro, Zepbound), Dulaglutide (Trulicity), etc ("GLP1 agonists") AND Canagliflozin (Invokana), Dapagliflozin (Farxiga), Empagliflozin (Jardiance), Ertugliflozin (Steglatro), Bexagliflozin [employer]) or any combination with one of these drugs such as Invokamet (Canagliflozin/Metformin), Synjardy (Empagliflozin/Metformin), etc ("SGLT2 inhibitors") must be held around the time of a procedure. This is not a comprehensive list of all of these drugs. Please review all of your medications and talk to your provider if you take any one of  these. If you are not sure, ask your provider.     Continue taking your anticoagulant (blood thinner): [name]  You will need to continue this after your procedure until you are told by your provider that it is safe to stop.    LABS:  Will be drawn today.    FYI:  For your safety, and to allow [place] to monitor your vital signs accurately during the surgery/procedure we request: If you have artificial nails, gel coating, SNS etc, please have those removed prior to your surgery/procedure. Not having the nail coverings /polish removed may result in cancellation or delay of your surgery/procedure.  Your support person will be asked to wait in the waiting room during your procedure.  It is OK to have someone drop you off and come back when you are ready to be discharged.  You cannot drive after the procedure and will need someone to drive you home.  Bring your insurance cards.  *Special Note: Every effort is made to have your procedure done on time. Occasionally there are emergencies that occur at the hospital that may cause delays. Please be patient if a delay does occur.      Follow-Up: At [facility], you and your health needs are our priority.  As part of our continuing mission to provide you with exceptional heart care, we have created designated Provider Care Teams.  These Care Teams include your primary Cardiologist (physician) and Advanced Practice Providers (APPs -  Physician Assistants and Nurse Practitioners) who all work together to provide you with the care you need, when you need it.  We recommend signing up for the  patient portal called "MyChart".  Sign up information is provided on this After Visit Summary.  MyChart is used to connect with patients for Virtual Visits (Telemedicine).  Patients are able to view lab/test results, encounter notes, upcoming appointments, etc.  Non-urgent messages can be sent to your provider as well.   To learn more about what you can do with  MyChart, go to [email or web address].    Your next appointment:   2 week(s)  Provider:   Juanda Crumble, PA-C

## 2024-03-02 NOTE — H&P (View-Only) (Signed)
 Cardiology Office Note   Date:  03/02/2024  ID:  Johnathan [name], DOB January 31, 1956, MRN [id number] PCP:  Natalia Leatherwood, DO [facility] Cardiologist: Nanetta Batty, MD  Reason for visit: A-fib/flutter, palpitations  History of Present Illness    Johnathan [name] is a 68 y.o. male with a hx of atrial fibrillation, premature family history of CAD (brother died from MI at age 73), hyperlipidemia, OSA.  Dr. Allyson Sabal mention history of A-fib ablation at [name] over 8 years ago with Dr. Ronn Melena.  History of 4 or 5 cardioversions since the ablation.  The most recent cardioversion I see in Epic is a visit to [name] in May 2017.  Patient was in atrial flutter with heart rate 110.  He had a DCCV with 200 J which converted him to sinus bradycardia heart rate 52.  Seen by Dr. Constance Haw.  He was last seen by Dr. Allyson Sabal in October 2024.  At tthat appointment, patient mention 1 brief episode of PAF which was self-limited (lasted 2 hours), continued on Tikosyn.  Patient was very active and exercises.  He was not on anticoagulation.  Patient was prescribed CPAP but does not use it.  Recommended follow-up in 1 year.  To note, patient went to the ED February 14, 2024 with persistent fevers and coughing.  With positive flu A, he was given Tamiflu.  Patient called our office yesterday.  Patient thought he was out of rhythm with heart rate around 100.  Stated his baseline heart rate was 50.  Since patient thought he was out of rhythm, he restarted Xarelto.  Today, patient recounts that he dealt with flu 3 weeks ago and flu symptoms resolved.  He mentions over the last 2 to 3 days, he felt head pressure, runny nose, slight cough and tiredness.  He went walking yesterday and noticed his heart racing and sweatiness similar to when he has been out of rhythm in the past.  He went back in the house and checked his heart rate at 5:30 PM, he noted his heart rate 100-106.  He took metoprolol 25 mg and Xarelto 20 mg.  He  states usually he self converts by the next day.  Since he remained out of rhythm, he came for appointment today.  Now, he feels occasional palpitations, sweatiness and fatigue.  He denies chest pain, shortness of breath, lightheadedness and lower extremity edema.  He took another Xarelto 20 mg this morning.  He states he is not completely opposed to taking maintenance anticoagulation and was told about a year ago that since he is now over 31, it is worth further conversation.  His wife asked about bleeding risk with significant injury.     Objective / Physical Exam   EKG today: 2-1 atrial flutter, heart rate 102  Vital signs:  BP 138/80 (BP Location: Left Arm, Patient Position: Sitting, Cuff Size: Normal)   Pulse (!) 106   Ht 6\' 1"  (1.854 m)   Wt 228 lb 6.4 oz (103.6 kg)   SpO2 98%   BMI 30.13 kg/m     GEN: No acute distress NECK: No carotid bruits CARDIAC: Irregular irregular, no murmurs RESPIRATORY:  Clear to auscultation without rales, wheezing or rhonchi  EXTREMITIES: No edema  Assessment and Plan   Paroxysmal atrial fibrillation/flutter -History of remote A-fib ablation at [name] with Dr. Delena Serve; history of cardioversions -His last cardioversion was in May 2017 at [facility], presented with atrial flutter heart 110, cardioverted with 200 J. -Treated with Tikosyn 500  mg twice daily; QTc in normal range. -Patient had normal kidney function potassium on CMET August 2024; check BMET today -CHA2DS2-VASc score 1 for his age. -Recommending he continue Xarelto 20 mg daily for at least 4 weeks post cardioversion.  I recommend he take Xarelto long-term for stroke prevention given his good health and no history of bleeding.  Xarelto refilled today. -Okay to take metoprolol 25 mg when he gets home and another dose tonight if needed.  Recommend avoiding metoprolol tomorrow given his baseline heart rate in the 50s -to avoid post cardioversion bradycardia. -Follow-up with the A-fib clinic 2 weeks post  cardioversion.  Follow-up anticoagulation maintenance plans and potential a-flutter ablation. -Orders for cardioversion placed.  Discussed with DOD Dr. Antoine Poche and physician on schedule for cardioversions tomorrow, Dr. Izora Ribas.   -Dr. Izora Ribas will discuss risk of not performing TEE precardioversion & can add TEE if recommended.  Since patient took anticoagulation without 6 hours of a flutter symptoms, TEE not absolutely required. -Patient has a diagnosis of sleep apnea but does not use CPAP.  Discussed how untreated sleep apnea is a risk factor for recurrent atrial arrhythmias.  Disposition - Follow-up in 2 weeks with A-fib clinic.   Signed, Cannon Kettle, PA-C  03/02/2024 [facility]

## 2024-03-03 ENCOUNTER — Encounter (HOSPITAL_COMMUNITY)
Admission: RE | Disposition: A | Discharge status: Home / Self Care | Payer: Self-pay | Source: Home / Self Care | Attending: Internal Medicine

## 2024-03-03 ENCOUNTER — Encounter: Payer: Self-pay | Admitting: Internal Medicine

## 2024-03-03 ENCOUNTER — Ambulatory Visit: Payer: 59 | Admitting: Internal Medicine

## 2024-03-03 ENCOUNTER — Ambulatory Visit (HOSPITAL_COMMUNITY)
Admission: RE | Admit: 2024-03-03 | Discharge: 2024-03-03 | Disposition: A | Discharge status: Home / Self Care | Source: Home / Self Care | Attending: Internal Medicine | Admitting: Internal Medicine

## 2024-03-03 ENCOUNTER — Other Ambulatory Visit: Payer: Self-pay

## 2024-03-03 ENCOUNTER — Ambulatory Visit (HOSPITAL_COMMUNITY)

## 2024-03-03 ENCOUNTER — Ambulatory Visit (HOSPITAL_COMMUNITY)
Admission: RE | Admit: 2024-03-03 | Discharge: 2024-03-03 | Disposition: A | Discharge status: Home / Self Care | Attending: Internal Medicine | Admitting: Internal Medicine

## 2024-03-03 VITALS — BP 112/78 | HR 107 | Temp 97.9°F | Ht 73.0 in | Wt 227.4 lb

## 2024-03-03 DIAGNOSIS — R7303 Prediabetes: Secondary | ICD-10-CM | POA: Diagnosis not present

## 2024-03-03 DIAGNOSIS — Z79899 Other long term (current) drug therapy: Secondary | ICD-10-CM | POA: Diagnosis not present

## 2024-03-03 DIAGNOSIS — Z7901 Long term (current) use of anticoagulants: Secondary | ICD-10-CM | POA: Diagnosis not present

## 2024-03-03 DIAGNOSIS — I484 Atypical atrial flutter: Secondary | ICD-10-CM

## 2024-03-03 DIAGNOSIS — I4891 Unspecified atrial fibrillation: Secondary | ICD-10-CM | POA: Diagnosis not present

## 2024-03-03 DIAGNOSIS — G4733 Obstructive sleep apnea (adult) (pediatric): Secondary | ICD-10-CM | POA: Insufficient documentation

## 2024-03-03 DIAGNOSIS — I4892 Unspecified atrial flutter: Secondary | ICD-10-CM | POA: Diagnosis present

## 2024-03-03 DIAGNOSIS — F419 Anxiety disorder, unspecified: Secondary | ICD-10-CM | POA: Diagnosis not present

## 2024-03-03 DIAGNOSIS — E782 Mixed hyperlipidemia: Secondary | ICD-10-CM

## 2024-03-03 DIAGNOSIS — Z8249 Family history of ischemic heart disease and other diseases of the circulatory system: Secondary | ICD-10-CM | POA: Diagnosis not present

## 2024-03-03 DIAGNOSIS — I480 Paroxysmal atrial fibrillation: Secondary | ICD-10-CM | POA: Diagnosis present

## 2024-03-03 DIAGNOSIS — Z9189 Other specified personal risk factors, not elsewhere classified: Secondary | ICD-10-CM | POA: Diagnosis not present

## 2024-03-03 HISTORY — PX: CARDIOVERSION: EP1203

## 2024-03-03 HISTORY — PX: TRANSESOPHAGEAL ECHOCARDIOGRAM (CATH LAB): EP1270

## 2024-03-03 LAB — POCT I-STAT, CHEM 8
BUN: 14 mg/dL (ref 8–23)
Calcium, Ion: 1.18 mmol/L (ref 1.15–1.40)
Chloride: 103 mmol/L (ref 98–111)
Creatinine, Ser: 1 mg/dL (ref 0.61–1.24)
Glucose, Bld: 107 mg/dL — ABNORMAL HIGH (ref 70–99)
HCT: 46 % (ref 39.0–52.0)
Hemoglobin: 15.6 g/dL (ref 13.0–17.0)
Potassium: 4.1 mmol/L (ref 3.5–5.1)
Sodium: 134 mmol/L — ABNORMAL LOW (ref 135–145)
TCO2: 22 mmol/L (ref 22–32)

## 2024-03-03 LAB — VITAMIN B12: Vitamin B-12: 622 pg/mL (ref 211–911)

## 2024-03-03 LAB — HEMOGLOBIN A1C: Hgb A1c MFr Bld: 6 % (ref 4.6–6.5)

## 2024-03-03 LAB — ECHO TEE

## 2024-03-03 SURGERY — CARDIOVERSION (CATH LAB)
Anesthesia: General

## 2024-03-03 MED ORDER — PROPOFOL 10 MG/ML IV BOLUS
INTRAVENOUS | Status: DC | PRN
  Administered 2024-03-03: 70 mg via INTRAVENOUS
  Administered 2024-03-03 (×2): 30 mg via INTRAVENOUS

## 2024-03-03 MED ORDER — PHENYLEPHRINE 80 MCG/ML (10ML) SYRINGE FOR IV PUSH (FOR BLOOD PRESSURE SUPPORT)
PREFILLED_SYRINGE | INTRAVENOUS | Status: DC | PRN
  Administered 2024-03-03: 160 ug via INTRAVENOUS

## 2024-03-03 MED ORDER — SODIUM CHLORIDE 0.9 % IV SOLN
INTRAVENOUS | Status: DC | PRN
Start: 2024-03-03 — End: 2024-03-03

## 2024-03-03 MED ORDER — PROPOFOL 500 MG/50ML IV EMUL
INTRAVENOUS | Status: DC | PRN
  Administered 2024-03-03: 150 ug/kg/min via INTRAVENOUS

## 2024-03-03 MED ORDER — SODIUM CHLORIDE 0.9% FLUSH: 3.0000 mL | INTRAVENOUS | Status: DC | PRN

## 2024-03-03 MED ORDER — LIDOCAINE 2% (20 MG/ML) 5 ML SYRINGE
INTRAMUSCULAR | Status: DC | PRN
Start: 2024-03-03 — End: 2024-03-03
  Administered 2024-03-03: 80 mg via INTRAVENOUS

## 2024-03-03 MED ORDER — SODIUM CHLORIDE 0.9% FLUSH
3.0000 mL | Freq: Two times a day (BID) | INTRAVENOUS | Status: DC
Start: 2024-03-03 — End: 2024-03-03

## 2024-03-03 SURGICAL SUPPLY — 1 items: PAD DEFIB RADIO PHYSIO CONN (PAD) ×1 IMPLANT

## 2024-03-03 NOTE — Transfer of Care (Signed)
 Immediate Anesthesia Transfer of Care Note  Patient: Johnathan [name]  Procedure(s) Performed: CARDIOVERSION TRANSESOPHAGEAL ECHOCARDIOGRAM  Patient Location: PACU and Cath Lab  Anesthesia Type:MAC  Level of Consciousness: drowsy  Airway & Oxygen Therapy: Patient Spontanous Breathing and Patient connected to nasal cannula oxygen  Post-op Assessment: Report given to RN and Post -op Vital signs reviewed and stable  Post vital signs: Reviewed and stable  Last Vitals:  Vitals Value Taken Time  BP 106/71 03/03/24 1330  Temp    Pulse 70 03/03/24 1329  Resp 13 03/03/24 1329  SpO2 97 % 03/03/24 1329  Vitals shown include unfiled device data.  Last Pain:  Vitals:   03/03/24 1215  TempSrc:   PainSc: 0-No pain         Complications: No notable events documented.

## 2024-03-03 NOTE — Assessment & Plan Note (Signed)
 Patient is not using CPAP due to not cleaning regularly and wife with concerns about not cleaning she was worried about him getting sick. Encouraged to clean regularly and start using as his recent A fib episode could be attributable to this. He will start back using.

## 2024-03-03 NOTE — Assessment & Plan Note (Signed)
Stable/no treatment needed

## 2024-03-03 NOTE — Assessment & Plan Note (Signed)
 Ordered calcium score to assess. He is not optimally controlled with atorvastatin last LDL >100. May need change to regimen based on results.

## 2024-03-03 NOTE — Anesthesia Preprocedure Evaluation (Addendum)
 Anesthesia Evaluation  Patient identified by MRN, date of birth, ID band Patient awake    Reviewed: Allergy & Precautions, H&P , NPO status , Patient's Chart, lab work & pertinent test results  Airway Mallampati: I  TM Distance: >3 FB Neck ROM: Full    Dental no notable dental hx. (+) Teeth Intact, Dental Advisory Given   Pulmonary sleep apnea and Continuous Positive Airway Pressure Ventilation    Pulmonary exam normal breath sounds clear to auscultation       Cardiovascular + dysrhythmias Atrial Fibrillation  Rhythm:Irregular Rate:Tachycardia     Neuro/Psych   Anxiety     negative neurological ROS  negative psych ROS   GI/Hepatic negative GI ROS, Neg liver ROS,,,  Endo/Other  negative endocrine ROS    Renal/GU negative Renal ROS  negative genitourinary   Musculoskeletal  (+) Arthritis , Osteoarthritis,    Abdominal   Peds  Hematology negative hematology ROS (+)   Anesthesia Other Findings   Reproductive/Obstetrics negative OB ROS                             Anesthesia Physical Anesthesia Plan  ASA: 3  Anesthesia Plan: General   Post-op Pain Management: Minimal or no pain anticipated   Induction: Intravenous  PONV Risk Score and Plan: 2 and Propofol infusion and Treatment may vary due to age or medical condition  Airway Management Planned: Natural Airway and Nasal Cannula  Additional Equipment:   Intra-op Plan:   Post-operative Plan:   Informed Consent: I have reviewed the patients History and Physical, chart, labs and discussed the procedure including the risks, benefits and alternatives for the proposed anesthesia with the patient or authorized representative who has indicated his/her understanding and acceptance.     Dental advisory given  Plan Discussed with: CRNA  Anesthesia Plan Comments:        Anesthesia Quick Evaluation

## 2024-03-03 NOTE — Anesthesia Postprocedure Evaluation (Signed)
 Anesthesia Post Note  Patient: Annitta Needs  Procedure(s) Performed: CARDIOVERSION TRANSESOPHAGEAL ECHOCARDIOGRAM     Patient location during evaluation: PACU Anesthesia Type: General Level of consciousness: awake and alert Pain management: pain level controlled Vital Signs Assessment: post-procedure vital signs reviewed and stable Respiratory status: spontaneous breathing, nonlabored ventilation, respiratory function stable and patient connected to nasal cannula oxygen Cardiovascular status: blood pressure returned to baseline and stable Postop Assessment: no apparent nausea or vomiting Anesthetic complications: no   No notable events documented.  Last Vitals:  Vitals:   03/03/24 1230 03/03/24 1330  BP: 125/86 106/71  Pulse: (!) 106 72  Resp: 19 20  Temp:  36.7 C  SpO2: 97% 97%    Last Pain:  Vitals:   03/03/24 1330  TempSrc: Temporal  PainSc: 0-No pain                 Mariann Barter

## 2024-03-03 NOTE — Assessment & Plan Note (Signed)
 Last HgA1c pre-diabetes and no recent check. Ordered HgA1c and B12 today.

## 2024-03-03 NOTE — Assessment & Plan Note (Signed)
 Taking lexapro 5 mg daily and well controlled. Still working and does have stress emanating from work. Continue.

## 2024-03-03 NOTE — Interval H&P Note (Addendum)
 History and Physical Interval Note:  03/03/2024 12:45 PM  Johnathan [name]  has presented today for surgery, with the diagnosis of AFIB.  The various methods of treatment have been discussed with the patient and family. After consideration of risks, benefits and other options for treatment, the patient has consented to  Procedure(s): CARDIOVERSION (N/A) as a surgical intervention.  The patient's history has been reviewed, patient examined, no change in status, stable for surgery.  I have reviewed the patient's chart and labs.  Questions were answered to the patient's satisfaction.  Discussed timing of his symptoms and his anticoagulation as it relates to his stroke risk.   Willson Lipa A Diallo Ponder  After further discussion to the risk and benefits given [facility] for 3 Days, patient is amenable to a transesophageal echocardiogram.   [facility] has been requested to perform a transesophageal echocardiogram on [name] for cardioversion.  After careful review of history and examination, the risks and benefits of transesophageal echocardiogram have been explained including risks of esophageal damage, perforation (1:10,000 risk), bleeding, pharyngeal hematoma as well as other potential complications associated with conscious sedation including aspiration, arrhythmia, respiratory failure and death. Alternatives to treatment were discussed, questions were answered. Patient is willing to proceed.   Riley Lam, MD [facility] Cardiologist [facility]  [address] [place], [address] [place], [place] [address] [phone]  12:51 PM

## 2024-03-03 NOTE — Assessment & Plan Note (Signed)
 Today in A fib and irreg irreg on exam. Getting cardioversion later today. Not using CPAP which could be a trigger. He was concerned about doing cardio type exercise due to A fib and given reassurance to wait 1-2 weeks then start cardio regimen to help overall with tone and fitness.

## 2024-03-03 NOTE — Assessment & Plan Note (Signed)
 Recent lipid with LDL low 100s. Checking calcium score and if low this may be adequate control. If not then will need escalation of dosing lipitor 40 mg daily.

## 2024-03-03 NOTE — Progress Notes (Signed)
   Subjective:   Patient ID: Johnathan [name], male    DOB: Mar 25, 1956, 68 y.o.   MRN: [id number]  HPI The patient is a 68 YO man coming in for transfer of care and general questions.  PMH, [facility], social history reviewed and updated  Review of Systems  Constitutional: Negative.   HENT: Negative.    Eyes: Negative.   Respiratory:  Negative for cough, chest tightness and shortness of breath.   Cardiovascular:  Negative for chest pain, palpitations and leg swelling.  Gastrointestinal:  Negative for abdominal distention, abdominal pain, constipation, diarrhea, nausea and vomiting.  Musculoskeletal: Negative.   Skin: Negative.   Neurological: Negative.   Psychiatric/Behavioral: Negative.      Objective:  Physical Exam Constitutional:      Appearance: He is well-developed.  HENT:     Head: Normocephalic and atraumatic.  Cardiovascular:     Rate and Rhythm: Normal rate. Rhythm irregular.  Pulmonary:     Effort: Pulmonary effort is normal. No respiratory distress.     Breath sounds: Normal breath sounds. No wheezing or rales.  Abdominal:     General: Bowel sounds are normal. There is no distension.     Palpations: Abdomen is soft.     Tenderness: There is no abdominal tenderness. There is no rebound.  Musculoskeletal:     Cervical back: Normal range of motion.  Skin:    General: Skin is warm and dry.  Neurological:     Mental Status: He is alert and oriented to person, place, and time.     Coordination: Coordination normal.     Vitals:   03/03/24 0821  BP: 112/78  Pulse: (!) 107  Temp: 97.9 F (36.6 C)  SpO2: 99%  Weight: 227 lb 6.4 oz (103.1 kg)  Height: 6\' 1"  (1.854 m)    Assessment & Plan:  Visit time 25 minutes in face to face communication with patient and coordination of care, additional 15 minutes spent in record review, coordination or care, ordering tests, communicating/referring to other healthcare professionals, documenting in medical records all on the same day  of the visit for total time 40 minutes spent on the visit.

## 2024-03-03 NOTE — CV Procedure (Signed)
   TRANSESOPHAGEAL ECHOCARDIOGRAM GUIDED DIRECT CURRENT CARDIOVERSION  NAME:  Johnathan [name]    MRN: [id number] DOB:  23-Aug-1956    ADMIT DATE: 03/03/2024  INDICATIONS: Symptomatic atrial fibrillation  PROCEDURE:   Informed consent was obtained prior to the procedure. The risks, benefits and alternatives for the procedure were discussed and the patient comprehended these risks.  Risks include, but are not limited to, cough, sore throat, vomiting, nausea, somnolence, esophageal and stomach trauma or perforation, bleeding, low blood pressure, aspiration, pneumonia, infection, trauma to the teeth and death.    After a procedural time-out, the oropharynx was anesthetized and the patient was sedated by the anesthesia service. The transesophageal probe was inserted in the esophagus and stomach without difficulty and multiple views were obtained. Anesthesia was monitored by Dr. Ace Gins.   COMPLICATIONS:    Complications: No complications Patient tolerated procedure well.  KEY FINDINGS:  Patent left atrial appendage. Full Report to follow.   CARDIOVERSION:     Indications:  Symptomatic atrial flutter  Procedure Details:  Once the TEE was complete, the patient had the defibrillator pads placed in the anterior and posterior position. Once an appropriate level of sedation was confirmed, the patient was cardioverted x 1 with 200J of biphasic synchronized energy.  The patient converted to NSR.  There were no apparent complications.  The patient had normal neuro status and respiratory status post procedure with vitals stable as recorded elsewhere.  Adequate airway was maintained throughout and vital signs monitored per protocol.  Sinus bradycardia post procedure.  Riley Lam, MD [place]  [facility]  1:25 PM

## 2024-03-04 LAB — CBC
Hematocrit: 47.6 % (ref 37.5–51.0)
Hemoglobin: 16.2 g/dL (ref 13.0–17.7)
MCH: 30.4 pg (ref 26.6–33.0)
MCHC: 34 g/dL (ref 31.5–35.7)
MCV: 89 fL (ref 79–97)
Platelets: 390 10*3/uL (ref 150–450)
RBC: 5.33 x10E6/uL (ref 4.14–5.80)
RDW: 14 % (ref 11.6–15.4)
WBC: 11.4 10*3/uL — ABNORMAL HIGH (ref 3.4–10.8)

## 2024-03-04 LAB — BASIC METABOLIC PANEL
BUN/Creatinine Ratio: 20 (ref 10–24)
BUN: 18 mg/dL (ref 8–27)
CO2: 17 mmol/L — ABNORMAL LOW (ref 20–29)
Calcium: 9.7 mg/dL (ref 8.6–10.2)
Chloride: 99 mmol/L (ref 96–106)
Creatinine, Ser: 0.89 mg/dL (ref 0.76–1.27)
Glucose: 104 mg/dL — ABNORMAL HIGH (ref 70–99)
Potassium: 4.6 mmol/L (ref 3.5–5.2)
Sodium: 134 mmol/L (ref 134–144)
eGFR: 94 mL/min/{1.73_m2} (ref >59)

## 2024-03-06 ENCOUNTER — Other Ambulatory Visit: Payer: Self-pay | Admitting: Family Medicine

## 2024-03-07 ENCOUNTER — Other Ambulatory Visit: Payer: Self-pay | Admitting: Internal Medicine

## 2024-03-07 NOTE — Telephone Encounter (Signed)
 Copied from CRM [id number]. Topic: Clinical - Medication Refill >> Mar 07, 2024  9:42 AM [place] J wrote: Most Recent Primary Care Visit:  Provider: Hillard Danker A  Department: [facility]  Visit Type: NEW PATIENT  Date: 03/03/2024  Medication: atorvastatin (LIPITOR) 40 MG  Has the patient contacted their pharmacy? Yes (Agent: If no, request that the patient contact the pharmacy for the refill. If patient does not wish to contact the pharmacy document the reason why and proceed with request.) (Agent: If yes, when and what did the pharmacy advise?) Contact new primary for refills.  Is this the correct pharmacy for this prescription? Yes If no, delete pharmacy and type the correct one.  This is the patient's preferred pharmacy:  [id number] - [place], [place] - [address] AT [address]NER OF [facility] [address] [place] [address] Phone: [phone] Fax: [phone]   Has the prescription been filled recently? No  Is the patient out of the medication? Yes  Has the patient been seen for an appointment in the last year OR does the patient have an upcoming appointment? Yes  Can we respond through MyChart? Yes  Agent: Please be advised that Rx refills may take up to 3 business days. We ask that you follow-up with your pharmacy.

## 2024-03-07 NOTE — Telephone Encounter (Signed)
 Copied from CRM [id number]. Topic: Clinical - Medication Refill >> Mar 07, 2024  9:44 AM [place] J wrote: Most Recent Primary Care Visit:  Provider: Hillard Danker A  Department: [facility]  Visit Type: NEW PATIENT  Date: 03/03/2024  Medication: escitalopram (LEXAPRO) 5 MG  Has the patient contacted their pharmacy? Yes (Agent: If no, request that the patient contact the pharmacy for the refill. If patient does not wish to contact the pharmacy document the reason why and proceed with request.) (Agent: If yes, when and what did the pharmacy advise?) Contact new primary for refills.  Is this the correct pharmacy for this prescription? Yes If no, delete pharmacy and type the correct one.  This is the patient's preferred pharmacy:  [id number] - [place], [place] - [address] AT [address] OF [facility] [address] [place] [address] Phone: [phone] Fax: [phone]   Has the prescription been filled recently? No  Is the patient out of the medication? No  Has the patient been seen for an appointment in the last year OR does the patient have an upcoming appointment? Yes  Can we respond through MyChart? Yes  Agent: Please be advised that Rx refills may take up to 3 business days. We ask that you follow-up with your pharmacy.

## 2024-03-07 NOTE — Telephone Encounter (Signed)
 Copied from CRM [id number]. Topic: Clinical - Medication Refill >> Mar 07, 2024  9:46 AM [place] J wrote: Most Recent Primary Care Visit:  Provider: Hillard Danker A  Department: [facility]  Visit Type: NEW PATIENT  Date: 03/03/2024  Medication: dofetilide (TIKOSYN) 500 MCG eszopiclone (LUNESTA) 2 MG   Has the patient contacted their pharmacy? Yes (Agent: If no, request that the patient contact the pharmacy for the refill. If patient does not wish to contact the pharmacy document the reason why and proceed with request.) (Agent: If yes, when and what did the pharmacy advise?) Contact new primary for refills.  Is this the correct pharmacy for this prescription? No If no, delete pharmacy and type the correct one.  This is the patient's preferred pharmacy:  [id number] - [place], [place] - [address] AT [address]NER OF [facility] [address] [place] [address] Phone: [phone] Fax: [phone]   Has the prescription been filled recently? No  Is the patient out of the medication? No  Has the patient been seen for an appointment in the last year OR does the patient have an upcoming appointment? Yes  Can we respond through MyChart? Yes  Agent: Please be advised that Rx refills may take up to 3 business days. We ask that you follow-up with your pharmacy.

## 2024-03-08 MED ORDER — ESCITALOPRAM OXALATE 5 MG PO TABS: 5.0000 mg | ORAL_TABLET | Freq: Every day | ORAL | 3 refills | Status: DC

## 2024-03-08 MED ORDER — ESZOPICLONE 2 MG PO TABS: 2.0000 mg | ORAL_TABLET | Freq: Every evening | ORAL | 0 refills | Status: DC | PRN

## 2024-03-08 MED ORDER — ATORVASTATIN CALCIUM 40 MG PO TABS: 40.0000 mg | ORAL_TABLET | Freq: Every day | ORAL | 3 refills | Status: DC

## 2024-03-10 ENCOUNTER — Telehealth: Payer: Self-pay

## 2024-03-10 ENCOUNTER — Ambulatory Visit (HOSPITAL_COMMUNITY)
Admission: RE | Admit: 2024-03-10 | Discharge: 2024-03-10 | Disposition: A | Discharge status: Home / Self Care | Payer: Self-pay | Source: Ambulatory Visit | Attending: Internal Medicine | Admitting: Internal Medicine

## 2024-03-10 DIAGNOSIS — Z9189 Other specified personal risk factors, not elsewhere classified: Secondary | ICD-10-CM | POA: Insufficient documentation

## 2024-03-10 NOTE — Telephone Encounter (Addendum)
 Results viewed by patient via My chart.----- Message from Cannon Kettle sent at 03/08/2024 12:13 PM EDT ----- Normal electrolytes and kidney function. Not anemic.  Normal hemoglobin. Continue current medications.

## 2024-03-14 ENCOUNTER — Telehealth: Payer: Self-pay

## 2024-03-14 ENCOUNTER — Encounter: Payer: Self-pay | Admitting: Internal Medicine

## 2024-03-14 NOTE — Telephone Encounter (Unsigned)
 Copied from CRM [id number]. Topic: Clinical - Medication Question >> Mar 14, 2024  8:50 AM Lennart Pall wrote: Reason for CRM: Patient received a message from DR Okey Dupre about taking medication for cholesterol, he has been taking it previously. Wanting a nurse to call him to go over the results from his test last week.

## 2024-03-17 ENCOUNTER — Other Ambulatory Visit: Payer: Self-pay

## 2024-03-17 MED ORDER — ESZOPICLONE 2 MG PO TABS: 2.0000 mg | ORAL_TABLET | Freq: Every evening | ORAL | 0 refills | Status: DC | PRN

## 2024-03-17 NOTE — Telephone Encounter (Signed)
 This was error on my end please send in refill for patient

## 2024-03-17 NOTE — Telephone Encounter (Signed)
 I had hit refill by mistake this was an error please send in refill for patient

## 2024-03-18 MED ORDER — ESZOPICLONE 2 MG PO TABS: 2.0000 mg | ORAL_TABLET | Freq: Every evening | ORAL | 1 refills | Status: DC | PRN

## 2024-03-18 NOTE — Addendum Note (Signed)
 Addended by: Myrlene Broker on: 03/18/2024 08:33 AM   Modules accepted: Orders

## 2024-03-18 NOTE — Telephone Encounter (Signed)
 Have sent in. Please if you have not done make a list of controlled so they are not inadvertently refilled. Thanks

## 2024-03-18 NOTE — Telephone Encounter (Signed)
 Noted will due

## 2024-03-21 ENCOUNTER — Inpatient Hospital Stay (HOSPITAL_COMMUNITY): Admission: RE | Admit: 2024-03-21 | Source: Ambulatory Visit | Admitting: Internal Medicine

## 2024-03-29 ENCOUNTER — Inpatient Hospital Stay (HOSPITAL_COMMUNITY): Admission: RE | Admit: 2024-03-29 | Source: Ambulatory Visit | Admitting: Internal Medicine

## 2024-04-11 ENCOUNTER — Telehealth: Payer: Self-pay | Admitting: Physician Assistant

## 2024-04-11 NOTE — Telephone Encounter (Signed)
   The patient called the answering service after-hours today.  Last seen in office 03/02/24, was back in atrial flutter at that time - historically on Tikosyn. At that visit, Xarelto was restarted as he had not been taking this. He underwent DCCV 3/6 with recommendation for 2 week follow-up with the atrial fib clinic. He cancelled his 3/24 OV and did not show to his 4/1 visit. Today his heart started jumping around, then gradually improved, but he noticed his heart rate has still been staying around 101 so he took metoprolol around 30 minutes ago. He thinks he may still be out of rhythm but remains essentially completely asymptomatic at this time. No CP, SOB, palpitations, feeling great. The metoprolol is 25mg  only PRN and he essentially never has to take this otherwise. He reports adherence to Tikosyn and Xarelto.  Given lack of symptoms and that he already took action with the metoprolol, I told him I would send a msg to the afib clinic team to review availability in AM to see if they can work him in to be seen. If not, may need a DOD visit. We discussed that if he develops symptoms or worsening changes in HR, proceed to ED.  The patient verbalized understanding and gratitude.  Lurie Mullane N Nahmir Zeidman, PA-C

## 2024-04-12 NOTE — Telephone Encounter (Signed)
 Pt calling back to check on the status of an appt for today due to this matter. Please advise

## 2024-04-14 ENCOUNTER — Telehealth: Payer: Self-pay | Admitting: Cardiology

## 2024-04-14 NOTE — Telephone Encounter (Signed)
 Patient c/o Palpitations:  STAT if patient reporting lightheadedness, shortness of breath, or chest pain  How long have you had palpitations/irregular HR/ Afib? Are you having the symptoms now? Patient says he is Afib at this time and have been for 3 days  Are you currently experiencing lightheadedness, SOB or CP? no  Do you have a history of afib (atrial fibrillation) or irregular heart rhythm?   Have you checked your BP or HR? (document readings if available):   Are you experiencing any other symptoms? No other symptoms,  Patient wants to be seen asap please. He has an appointment on Monday with Dr Katheryne Pane, but would like to be seen before that time.

## 2024-04-14 NOTE — Telephone Encounter (Signed)
 Spoke with patient and he states he is back in AFIB. He is asymptomatic. He feels fine. HR 100-104 bpm. He is taking his medicine as prescribed. He has appointment Monday with Dr. Katheryne Pane and Afib clinic on Tuesday.  Informed patient to continue to monitor for now. Keep taking his medication as prescribed. ED precautions discussed.

## 2024-04-14 NOTE — Telephone Encounter (Signed)
 TID is make sure that he is still taking his medications especially in the Xarelto and Tikosyn.  As long as his bradycardia is pretty well-controlled he is asymptomatic and we can probably wait until his Monday visit.  If he becomes symptomatic then would recommend he goes to [facility], ER where they can potentially consider cardioversion if he has not missed any doses of his Xarelto.   Randene Bustard, MD

## 2024-04-18 ENCOUNTER — Ambulatory Visit: Attending: Cardiovascular Disease | Admitting: Cardiovascular Disease

## 2024-04-18 ENCOUNTER — Encounter: Payer: Self-pay | Admitting: Cardiovascular Disease

## 2024-04-18 VITALS — BP 114/88 | HR 108 | Ht 73.0 in | Wt 232.2 lb

## 2024-04-18 DIAGNOSIS — I4892 Unspecified atrial flutter: Secondary | ICD-10-CM | POA: Diagnosis not present

## 2024-04-18 DIAGNOSIS — I4821 Permanent atrial fibrillation: Secondary | ICD-10-CM | POA: Diagnosis not present

## 2024-04-18 DIAGNOSIS — G4733 Obstructive sleep apnea (adult) (pediatric): Secondary | ICD-10-CM | POA: Diagnosis not present

## 2024-04-18 DIAGNOSIS — E782 Mixed hyperlipidemia: Secondary | ICD-10-CM

## 2024-04-18 DIAGNOSIS — R931 Abnormal findings on diagnostic imaging of heart and coronary circulation: Secondary | ICD-10-CM

## 2024-04-18 MED ORDER — ATORVASTATIN CALCIUM 80 MG PO TABS: 80.0000 mg | ORAL_TABLET | Freq: Every day | ORAL | 3 refills | Status: AC

## 2024-04-18 MED ORDER — EZETIMIBE 10 MG PO TABS: 10.0000 mg | ORAL_TABLET | Freq: Every day | ORAL | 3 refills | Status: AC

## 2024-04-18 NOTE — Assessment & Plan Note (Signed)
 History of A-fib ablation by Dr. Coreen Devoid at [facility] in 2015.  He has had multiple cardioversions since.  He was in sinus rhythm for 8 years until 03/03/2024 when he had recurrent a flutter and underwent outpatient TEE guided DC cardioversion by Dr.Chandrasekhar successfully to sinus rhythm.  He was started on Xarelto .  He is in sinus rhythm today although he is mildly tachycardic with a heart rate of 108.  He is scheduled to see the A-fib clinic tomorrow.

## 2024-04-18 NOTE — Progress Notes (Signed)
 04/18/2024 Johnathan [name]   Jun 24, 1956  [id number]  Primary Physician Adelia Homestead, MD Primary Cardiologist: Avanell Leigh MD Bennye Bravo, [place]  HPI:  Johnathan [name] is a 68 y.o.  mildly overweight married Caucasian male father of 2 children, grandfather of 1 grandchild , who works at [employer] in [employer] [place] .   He was referred by Dr. Anniece Kind D.O. establish her cardiovascular practice because his prior A. fib history.  I last saw him in the office 10/05/2023.Johnathan [name]  His cardiovascular risk factor profile is notable for treated hyperlipidemia and family history. Brother died of myocardial infarction at age 59. He's never had a heart attack or stroke. He denies chest pain or shortness of breathHe had A. fib ablation at [facility] 8  years ago by Dr. Coreen Devoid. He currently is on Tikosyn . He is not on oral anticoagulation. He said 4-5 DC cardioversions in the past since his ablation, the last one being 4  years ago at [facility] by Dr. Jadene Maxwell.    Since I saw him in the office 7 months ago he did have recurrent PAF 03/03/2024 and underwent outpatient TEE guided DC cardioversion successfully to sinus rhythm by Dr.Chandrasekhar.  He currently is on Xarelto .  Heart rate is mildly elevated although he still in sinus rhythm today.  He is completely asymptomatic and has an appointment to see the A-fib clinic tomorrow morning.   Current Meds  Medication Sig   allopurinol  (ZYLOPRIM ) 300 MG tablet Take 1 tablet (300 mg total) by mouth daily. (Patient taking differently: Take 150 mg by mouth 2 (two) times daily.)   atorvastatin  (LIPITOR) 40 MG tablet Take 1 tablet (40 mg total) by mouth daily.   dofetilide  (TIKOSYN ) 500 MCG capsule Take 1 capsule (500 mcg total) by mouth 2 (two) times daily.   escitalopram  (LEXAPRO ) 5 MG tablet Take 1 tablet (5 mg total) by mouth daily.   eszopiclone  (LUNESTA ) 2 MG TABS tablet Take 1 tablet (2 mg total) by mouth at  bedtime as needed for sleep.   Multiple Vitamin (MULTIVITAMIN WITH MINERALS) TABS tablet Take 1 tablet by mouth daily.   rivaroxaban  (XARELTO ) 20 MG TABS tablet Take 1 tablet (20 mg total) by mouth daily with supper. (Patient taking differently: Take 20 mg by mouth daily.)     Allergies  Allergen Reactions   Sulfa Antibiotics     Unknown reaction in childhood    Social History   Socioeconomic History   Marital status: Married    Spouse name: Solicitor   Number of children: 2   Years of education: 16   Highest education level: Not on file  Occupational History   Not on file  Tobacco Use   Smoking status: Never   Smokeless tobacco: Former    Types: Engineer, drilling   Vaping status: Never Used  Substance and Sexual Activity   Alcohol use: Yes    Comment: Occasional   Drug use: No   Sexual activity: Yes    Partners: Female    Comment: Married  Other Topics Concern   Not on file  Social History Narrative   Married to Johnathan [name]. 2 children [place].    BS degree.    Wear seatbelt. Wears a bicycle helmet. Smoke detector in the home.   Firearms in the home.   Feels safe in  Relationships.   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   Social Drivers of Health   Financial Resource Strain: Not on file  Food Insecurity: Not on file  Transportation Needs: Not on file  Physical Activity: Not on file  Stress: Not on file  Social Connections: Unknown (05/29/2023)   Received from [facility], [facility]   Social Network    Social Network: Not on file  Intimate Partner Violence: Unknown (05/29/2023)   Received from [facility], [facility]   HITS    Physically Hurt: Not on file    Insult or Talk Down To: Not on file    Threaten Physical Harm: Not on file    Scream or Curse: Not on file     Review of Systems: General: negative for chills, fever, night sweats or weight  changes.  Cardiovascular: negative for chest pain, dyspnea on exertion, edema, orthopnea, palpitations, paroxysmal nocturnal dyspnea or shortness of breath Dermatological: negative for rash Respiratory: negative for cough or wheezing Urologic: negative for hematuria Abdominal: negative for nausea, vomiting, diarrhea, bright red blood per rectum, melena, or hematemesis Neurologic: negative for visual changes, syncope, or dizziness All other systems reviewed and are otherwise negative except as noted above.    Blood pressure 114/88, pulse (!) 108, height 6\' 1"  (1.854 m), weight 232 lb 3.2 oz (105.3 kg), SpO2 100%.  General appearance: alert and no distress Neck: no adenopathy, no carotid bruit, no JVD, supple, symmetrical, trachea midline, and thyroid  not enlarged, symmetric, no tenderness/mass/nodules Lungs: clear to auscultation bilaterally Heart: regular rate and rhythm, S1, S2 normal, no murmur, click, rub or gallop Extremities: extremities normal, atraumatic, no cyanosis or edema Pulses: 2+ and symmetric Skin: Skin color, texture, turgor normal. No rashes or lesions Neurologic: Grossly normal  EKG EKG Interpretation Date/Time:  Monday April 18 2024 09:09:03 EDT Ventricular Rate:  108 PR Interval:  130 QRS Duration:  94 QT Interval:  354 QTC Calculation: 474 R Axis:   -73  Text Interpretation: Sinus tachycardia Pulmonary disease pattern Left anterior fascicular block When compared with ECG of 02-Mar-2024 10:35, No significant change was found Confirmed by Lauro Portal [phone]) on 04/18/2024 9:43:47 AM    ASSESSMENT AND PLAN:   Permanent atrial fibrillation (HCC) History of A-fib ablation by Dr. Coreen Devoid at [facility] in 2015.  He has had multiple cardioversions since.  He was in sinus rhythm for 8 years until 03/03/2024 when he had recurrent a flutter and underwent outpatient TEE guided DC cardioversion by Dr.Chandrasekhar successfully to sinus rhythm.  He was started on Xarelto .   He is in sinus rhythm today although he is mildly tachycardic with a heart rate of 108.  He is scheduled to see the A-fib clinic tomorrow.  Hyperlipidemia History of hyperlipidemia on atorvastatin  40 mg a day with lipid profile performed 01/19/2023 revealing total cholesterol 176, LDL 104 and HDL 56.  Given his mildly elevated coronary calcium  score he is not at goal for secondary prevention.  I am going to increase his atorvastatin  from 40 to 80 mg a day and we will start Zetia  10 mg a day with LDL goal of less than 70.  OSA on CPAP History of obstructive sleep apnea on CPAP  Elevated coronary artery calcium  score Coronary calcium  score performed 03/10/2024 was 78, the majority of which was in the LAD.  He is completely asymptomatic but not at goal for secondary prevention.  We will just his lipid-lowering medications.     Avanell Leigh  MD Bennye Bravo, Theodis Fiscal 04/18/2024 9:47 AM

## 2024-04-18 NOTE — Assessment & Plan Note (Signed)
 Coronary calcium  score performed 03/10/2024 was 78, the majority of which was in the LAD.  He is completely asymptomatic but not at goal for secondary prevention.  We will just his lipid-lowering medications.

## 2024-04-18 NOTE — Assessment & Plan Note (Signed)
 History of obstructive sleep apnea on CPAP.

## 2024-04-18 NOTE — Patient Instructions (Signed)
 Medication Instructions:  Your physician has recommended you make the following change in your medication:   -Increase atorvastatin  (lipitor) 80mg  once daily.  -Start ezetimibe  (zetia ) 10mg  once daily.  *If you need a refill on your cardiac medications before your next appointment, please call your pharmacy*  Lab Work: Your physician recommends that you return for lab work in: 3 months for FASTING Lipid/liver panel  If you have labs (blood work) drawn today and your tests are completely normal, you will receive your results only by: MyChart Message (if you have MyChart) OR A paper copy in the mail If you have any lab test that is abnormal or we need to change your treatment, we will call you to review the results.   Follow-Up: At [facility], you and your health needs are our priority.  As part of our continuing mission to provide you with exceptional heart care, our providers are all part of one team.  This team includes your primary Cardiologist (physician) and Advanced Practice Providers or APPs (Physician Assistants and Nurse Practitioners) who all work together to provide you with the care you need, when you need it.  Your next appointment:   6 month(s)  Provider:   Lauro Portal, MD     We recommend signing up for the patient portal called "MyChart".  Sign up information is provided on this After Visit Summary.  MyChart is used to connect with patients for Virtual Visits (Telemedicine).  Patients are able to view lab/test results, encounter notes, upcoming appointments, etc.  Non-urgent messages can be sent to your provider as well.   To learn more about what you can do with MyChart, go to [email or web address].   Other Instructions       1st Floor: - Lobby - Registration  - Pharmacy  - Lab - Cafe  2nd Floor: - PV Lab - Diagnostic Testing (echo, CT, nuclear med)  3rd Floor: - Vacant  4th Floor: - TCTS (cardiothoracic surgery) - AFib Clinic -  Structural Heart Clinic - Vascular Surgery  - Vascular Ultrasound  5th Floor: - HeartCare Cardiology (general and EP) - Clinical Pharmacy for coumadin, hypertension, lipid, weight-loss medications, and med management appointments    Valet parking services will be available as well.

## 2024-04-18 NOTE — Assessment & Plan Note (Signed)
 History of hyperlipidemia on atorvastatin  40 mg a day with lipid profile performed 01/19/2023 revealing total cholesterol 176, LDL 104 and HDL 56.  Given his mildly elevated coronary calcium  score he is not at goal for secondary prevention.  I am going to increase his atorvastatin  from 40 to 80 mg a day and we will start Zetia  10 mg a day with LDL goal of less than 70.

## 2024-04-19 ENCOUNTER — Ambulatory Visit (HOSPITAL_COMMUNITY)
Admission: RE | Admit: 2024-04-19 | Discharge: 2024-04-19 | Disposition: A | Discharge status: Home / Self Care | Source: Ambulatory Visit | Attending: Internal Medicine | Admitting: Internal Medicine

## 2024-04-19 VITALS — BP 134/88 | HR 111 | Ht 73.0 in | Wt 233.2 lb

## 2024-04-19 DIAGNOSIS — Z5181 Encounter for therapeutic drug level monitoring: Secondary | ICD-10-CM | POA: Insufficient documentation

## 2024-04-19 DIAGNOSIS — Z79899 Other long term (current) drug therapy: Secondary | ICD-10-CM | POA: Diagnosis not present

## 2024-04-19 DIAGNOSIS — I4819 Other persistent atrial fibrillation: Secondary | ICD-10-CM | POA: Diagnosis not present

## 2024-04-19 DIAGNOSIS — Z7901 Long term (current) use of anticoagulants: Secondary | ICD-10-CM | POA: Insufficient documentation

## 2024-04-19 DIAGNOSIS — I444 Left anterior fascicular block: Secondary | ICD-10-CM | POA: Insufficient documentation

## 2024-04-19 DIAGNOSIS — I4892 Unspecified atrial flutter: Secondary | ICD-10-CM | POA: Insufficient documentation

## 2024-04-19 DIAGNOSIS — I484 Atypical atrial flutter: Secondary | ICD-10-CM

## 2024-04-19 DIAGNOSIS — G4733 Obstructive sleep apnea (adult) (pediatric): Secondary | ICD-10-CM | POA: Diagnosis not present

## 2024-04-19 DIAGNOSIS — E785 Hyperlipidemia, unspecified: Secondary | ICD-10-CM | POA: Diagnosis not present

## 2024-04-19 MED ORDER — METOPROLOL SUCCINATE ER 25 MG PO TB24
12.5000 mg | ORAL_TABLET | Freq: Every day | ORAL | 4 refills | Status: DC
Start: 2024-04-19 — End: 2024-04-25

## 2024-04-19 NOTE — Progress Notes (Signed)
 Primary Care Physician: Adelia Homestead, MD Primary Cardiologist: Lauro Portal, MD Electrophysiologist: None     Referring Physician: Phyliss Breen, PA-C     Johnathan [name] is a 68 y.o. male with a history of OSA on CPAP, HLD, and paroxysmal atrial fibrillation who presents for consultation in the [facility]. Seen by Cardiology on 03/02/24 and noted to be in atrial flutter. S/p successful DCCV on 03/03/24. Seen by Dr. Katheryne Pane yesterday 4/21 and noted to be in sinus tachycardia. Patient is on Xarelto  20 mg for a CHADS2VASC score of 1.  On evaluation today, he is currently in atrial flutter with RVR. He has been out of rhythm for the past several days and notes unfortunately to have gone out of rhythm since DCCV on 3/6. NO missed doses of Xarelto . Patient is noted to have CPAP come off in middle of night frequently.    Today, he denies symptoms of palpitations, chest pain, shortness of breath, orthopnea, PND, lower extremity edema, dizziness, presyncope, syncope, snoring, daytime somnolence, bleeding, or neurologic sequela. The patient is tolerating medications without difficulties and is otherwise without complaint today.    Atrial Fibrillation Risk Factors:  he does have symptoms or diagnosis of sleep apnea. he is compliant with CPAP therapy.   he has a BMI of Body mass index is 30.77 kg/m.Aaron Aas Filed Weights   04/19/24 1451  Weight: 105.8 kg    Current Outpatient Medications  Medication Sig Dispense Refill   allopurinol  (ZYLOPRIM ) 300 MG tablet Take 1 tablet (300 mg total) by mouth daily. (Patient taking differently: Take 150 mg by mouth 2 (two) times daily.) 90 tablet 3   atorvastatin  (LIPITOR) 80 MG tablet Take 1 tablet (80 mg total) by mouth daily. 90 tablet 3   dofetilide  (TIKOSYN ) 500 MCG capsule Take 1 capsule (500 mcg total) by mouth 2 (two) times daily. 180 capsule 3   escitalopram  (LEXAPRO ) 5 MG tablet Take 1 tablet (5 mg total) by mouth  daily. 90 tablet 3   eszopiclone  (LUNESTA ) 2 MG TABS tablet Take 1 tablet (2 mg total) by mouth at bedtime as needed for sleep. 90 tablet 1   metoprolol  succinate (TOPROL  XL) 25 MG 24 hr tablet Take 0.5 tablets (12.5 mg total) by mouth daily. 30 tablet 4   Multiple Vitamin (MULTIVITAMIN WITH MINERALS) TABS tablet Take 1 tablet by mouth 3 (three) times a week.     rivaroxaban  (XARELTO ) 20 MG TABS tablet Take 1 tablet (20 mg total) by mouth daily with supper. (Patient taking differently: Take 20 mg by mouth daily.) 30 tablet 11   ezetimibe  (ZETIA ) 10 MG tablet Take 1 tablet (10 mg total) by mouth daily. (Patient not taking: Reported on 04/19/2024) 90 tablet 3   No current facility-administered medications for this encounter.    Atrial Fibrillation Management history:  Previous antiarrhythmic drugs: Tikosyn  Previous cardioversions: multiple remotely, 03/03/24 Previous ablations: ~2016 [facility] Anticoagulation history: Xarelto    ROS- All systems are reviewed and negative except as per the HPI above.  Physical Exam: BP 134/88   Pulse (!) 111   Ht 6\' 1"  (1.854 m)   Wt 105.8 kg   BMI 30.77 kg/m   GEN: Well nourished, well developed in no acute distress NECK: No JVD; No carotid bruits CARDIAC: Tachycardic rate and rhythm, no murmurs, rubs, gallops RESPIRATORY:  Clear to auscultation without rales, wheezing or rhonchi  ABDOMEN: Soft, non-tender, non-distended EXTREMITIES:  No edema; No deformity   EKG today demonstrates  Vent.  rate 111 BPM PR interval * ms QRS duration 88 ms QT/QTcB 328/446 ms P-R-T axes 123 -82 50 Atrial flutter with 2:1 A-V conduction Pulmonary disease pattern Left anterior fascicular block Abnormal ECG When compared with ECG of 18-Apr-2024 09:09, PREVIOUS ECG IS PRESENT  Echo 03/03/24 (TEE) demonstrated  1. Left ventricular ejection fraction, by estimation, is 50 to 55%. The  left ventricle has low normal function.   2. Right ventricular systolic function is  normal. The right ventricular  size is mildly enlarged.   3. No atrial kick on mitral TDI. No left atrial/left atrial appendage  thrombus was detected. The LAA emptying velocity was 60 cm/s.   4. The mitral valve is normal in structure. Trivial mitral valve  regurgitation. No evidence of mitral stenosis.   5. The aortic valve is tricuspid. Aortic valve regurgitation is trivial.  No aortic stenosis is present.   6. The inferior vena cava is normal in size with greater than 50%  respiratory variability, suggesting right atrial pressure of 3 mmHg.   7. NA and demonstrates No 3D acquistioin.   ASSESSMENT & PLAN CHA2DS2-VASc Score = 1  The patient's score is based upon: CHF History: 0 HTN History: 0 Diabetes History: 0 Stroke History: 0 Vascular Disease History: 0 Age Score: 1 Gender Score: 0       ASSESSMENT AND PLAN: Persistent Atrial Fibrillation (ICD10:  I48.19) / atrial flutter The patient's CHA2DS2-VASc score is 1, indicating a 0.6% annual risk of stroke.   S/p successful DCCV on 03/03/24.  He is currently in atrial flutter with RVR. He has had ERAF despite being on Tikosyn . We discussed rate control and speak with EP regarding repeat ablation. We also discussed repeat cardioversion however it was emphasized to patient there is no clear confirmation on how long he would maintain normal rhythm given recent cardioversion. He would like to speak with EP regarding ablation. He would like to speak with ENT regarding possibility of Inspire device since CPAP mask keeps coming off in middle of night. Will begin Toprol  12.5 mg daily. Patient will think over repeat cardioversion and will contact us  if wishes to proceed with repeat attempt. If he does, will require bloodwork. Continue Xarelto  (no missed doses).   High risk medication monitoring (ICD10: [id number]) Patient requires ongoing monitoring for anti-arrhythmic medication which has the potential to cause life threatening arrhythmias or AV  block. Qtc stable. Continue Tikosyn  500 mcg BID.   Follow up with EP to discuss repeat ablation.    Minnie Amber, PA-C  [facility] [address] [place], [place] [address] [phone]

## 2024-04-19 NOTE — Patient Instructions (Addendum)
 Ablation doctors: Dr. Agatha Horsfall Dr. Harvie Liner Dr. Marlane Silver Dr. Ardeen Kohler    Cardiologists: Dr. Lavonne Prairie Dr. Renna Cary Dr. Croitoru Dr. Alda Amas Dr. Paulita Boss   Start Metoprolol  1/2 tablet once a day (12.5mg )  If decide to proceed with cardioversion - call Loyce Ruffini [phone]

## 2024-04-25 ENCOUNTER — Other Ambulatory Visit (HOSPITAL_COMMUNITY): Payer: Self-pay

## 2024-04-25 ENCOUNTER — Telehealth (HOSPITAL_COMMUNITY): Payer: Self-pay

## 2024-04-25 MED ORDER — DILTIAZEM HCL 30 MG PO TABS: ORAL_TABLET | ORAL | 1 refills | Status: AC

## 2024-04-25 NOTE — Telephone Encounter (Signed)
 Patient called back about metoprolol  12.5mg  causing SHOB. Talked with Sam Creighton he stated to d/c medication and switch to Diltiazem 30mg  PRN. Patient wanted an appointment to regroup 04/30 at 10:30am with Sam Creighton

## 2024-04-26 ENCOUNTER — Ambulatory Visit: Attending: Cardiology | Admitting: Cardiology

## 2024-04-26 ENCOUNTER — Encounter: Payer: Self-pay | Admitting: Cardiology

## 2024-04-26 VITALS — BP 120/76 | HR 93 | Ht 73.0 in | Wt 235.0 lb

## 2024-04-26 DIAGNOSIS — Z79899 Other long term (current) drug therapy: Secondary | ICD-10-CM

## 2024-04-26 DIAGNOSIS — D6869 Other thrombophilia: Secondary | ICD-10-CM

## 2024-04-26 DIAGNOSIS — Z01812 Encounter for preprocedural laboratory examination: Secondary | ICD-10-CM

## 2024-04-26 DIAGNOSIS — I484 Atypical atrial flutter: Secondary | ICD-10-CM | POA: Diagnosis not present

## 2024-04-26 DIAGNOSIS — I4819 Other persistent atrial fibrillation: Secondary | ICD-10-CM | POA: Diagnosis not present

## 2024-04-26 NOTE — Progress Notes (Signed)
 Electrophysiology Office Note:   Date:  04/26/2024  ID:  Johnathan [name], DOB 27-Feb-1956, MRN [id number]  Primary Cardiologist: Lauro Portal, MD Primary Heart Failure: None Electrophysiologist: Jushua Waltman [name], MD      History of Present Illness:   Johnathan [name] is a 68 y.o. male with h/o sleep apnea, hyperlipidemia, atrial fibrillation/flutter seen today for  for Electrophysiology evaluation of atrial fibrillation at the request of Johnathan [name].    He was seen in cardiology clinic 03/02/2024 and was noted to be in atrial flutter.  He had a cardioversion, but unfortunately has gone back into atrial flutter.  Today, denies symptoms of palpitations, chest pain, shortness of breath, orthopnea, PND, lower extremity edema, claudication, dizziness, presyncope, syncope, bleeding, or neurologic sequela. The patient is tolerating medications without difficulties.  He feels somewhat fatigued with mild shortness of breath.  He has not been exercising due to his atrial fibrillation.  He finds it difficult to do some of his daily activities.  There are some days that he feels well and other days that he feels quite poorly.   Review of systems complete and found to be negative unless listed in HPI.   EP Information / Studies Reviewed:    EKG is ordered today. Personal review as below.  EKG Interpretation Date/Time:  Tuesday April 26 2024 14:50:12 EDT Ventricular Rate:  99 PR Interval:    QRS Duration:  96 QT Interval:  346 QTC Calculation: 444 R Axis:   -34  Text Interpretation: Atrial flutter Left axis deviation When compared with ECG of 19-Apr-2024 15:06, No significant change since last tracing Confirmed by Ferguson Gertner ([id number]) on 04/26/2024 2:56:25 PM     Risk Assessment/Calculations:    CHA2DS2-VASc Score = 1   This indicates a 0.6% annual risk of stroke. The patient's score is based upon: CHF History: 0 HTN History: 0 Diabetes History: 0 Stroke History: 0 Vascular Disease History:  0 Age Score: 1 Gender Score: 0              Physical Exam:   VS:  BP 120/76 (BP Location: Right Arm, Patient Position: Sitting, Cuff Size: Large)   Pulse 93   Ht 6\' 1"  (1.854 m)   Wt 235 lb (106.6 kg)   SpO2 98%   BMI 31.00 kg/m    Wt Readings from Last 3 Encounters:  04/26/24 235 lb (106.6 kg)  04/19/24 233 lb 3.2 oz (105.8 kg)  04/18/24 232 lb 3.2 oz (105.3 kg)     GEN: Well nourished, well developed in no acute distress NECK: No JVD; No carotid bruits CARDIAC: Irregularly irregular rate and rhythm, no murmurs, rubs, gallops RESPIRATORY:  Clear to auscultation without rales, wheezing or rhonchi  ABDOMEN: Soft, non-tender, non-distended EXTREMITIES:  No edema; No deformity   ASSESSMENT AND PLAN:    1.  Persistent atrial fibrillation/flutter: Currently on dofetilide .  He feels poorly in atrial fibrillation.  His dofetilide  is no longer controlling his symptoms.  He would benefit from alternative rhythm control.  As he is feeling poorly, we Johnathan [name] move forward with cardioversion.  He would also like ablation.  Risk and benefits were discussed.  He understands the risks and is agreed to the procedure.  Risk, benefits, and alternatives to EP study and radiofrequency/pulse field ablation for afib were also discussed in detail today. These risks include but are not limited to stroke, bleeding, vascular damage, tamponade, perforation, damage to the esophagus, lungs, and other structures, pulmonary vein stenosis, worsening renal function, and death.  The patient understands these risk and wishes to proceed.  We Johnathan [name] therefore proceed with catheter ablation at the next available time.  Carto, ICE, anesthesia are requested for the procedure.  Johnathan [name] also obtain CT PV protocol prior to the procedure to exclude LAA thrombus and further evaluate atrial anatomy.  2.  High risk medication monitoring: Currently on dofetilide .  QTc remained stable.  3.  Obstructive sleep apnea: CPAP compliance  encouraged.  He has been referred for inspire device.  Follow up with Afib Clinic as usual post procedure  Signed, Johnathan Naas [name], MD

## 2024-04-26 NOTE — Addendum Note (Signed)
 Addended by: Render Carrie on: 04/26/2024 03:26 PM   Modules accepted: Orders

## 2024-04-26 NOTE — Addendum Note (Signed)
 Addended by: Alvenia Aus on: 04/26/2024 03:33 PM   Modules accepted: Orders

## 2024-04-26 NOTE — H&P (View-Only) (Signed)
 Electrophysiology Office Note:   Date:  04/26/2024  ID:  Johnathan [name], DOB 27-Feb-1956, MRN [id number]  Primary Cardiologist: Lauro Portal, MD Primary Heart Failure: None Electrophysiologist: Jushua Waltman [name], MD      History of Present Illness:   Johnathan [name] is a 68 y.o. male with h/o sleep apnea, hyperlipidemia, atrial fibrillation/flutter seen today for  for Electrophysiology evaluation of atrial fibrillation at the request of Johnathan [name].    He was seen in cardiology clinic 03/02/2024 and was noted to be in atrial flutter.  He had a cardioversion, but unfortunately has gone back into atrial flutter.  Today, denies symptoms of palpitations, chest pain, shortness of breath, orthopnea, PND, lower extremity edema, claudication, dizziness, presyncope, syncope, bleeding, or neurologic sequela. The patient is tolerating medications without difficulties.  He feels somewhat fatigued with mild shortness of breath.  He has not been exercising due to his atrial fibrillation.  He finds it difficult to do some of his daily activities.  There are some days that he feels well and other days that he feels quite poorly.   Review of systems complete and found to be negative unless listed in HPI.   EP Information / Studies Reviewed:    EKG is ordered today. Personal review as below.  EKG Interpretation Date/Time:  Tuesday April 26 2024 14:50:12 EDT Ventricular Rate:  99 PR Interval:    QRS Duration:  96 QT Interval:  346 QTC Calculation: 444 R Axis:   -34  Text Interpretation: Atrial flutter Left axis deviation When compared with ECG of 19-Apr-2024 15:06, No significant change since last tracing Confirmed by Ferguson Gertner ([id number]) on 04/26/2024 2:56:25 PM     Risk Assessment/Calculations:    CHA2DS2-VASc Score = 1   This indicates a 0.6% annual risk of stroke. The patient's score is based upon: CHF History: 0 HTN History: 0 Diabetes History: 0 Stroke History: 0 Vascular Disease History:  0 Age Score: 1 Gender Score: 0              Physical Exam:   VS:  BP 120/76 (BP Location: Right Arm, Patient Position: Sitting, Cuff Size: Large)   Pulse 93   Ht 6\' 1"  (1.854 m)   Wt 235 lb (106.6 kg)   SpO2 98%   BMI 31.00 kg/m    Wt Readings from Last 3 Encounters:  04/26/24 235 lb (106.6 kg)  04/19/24 233 lb 3.2 oz (105.8 kg)  04/18/24 232 lb 3.2 oz (105.3 kg)     GEN: Well nourished, well developed in no acute distress NECK: No JVD; No carotid bruits CARDIAC: Irregularly irregular rate and rhythm, no murmurs, rubs, gallops RESPIRATORY:  Clear to auscultation without rales, wheezing or rhonchi  ABDOMEN: Soft, non-tender, non-distended EXTREMITIES:  No edema; No deformity   ASSESSMENT AND PLAN:    1.  Persistent atrial fibrillation/flutter: Currently on dofetilide .  He feels poorly in atrial fibrillation.  His dofetilide  is no longer controlling his symptoms.  He would benefit from alternative rhythm control.  As he is feeling poorly, we Ethelle Ola move forward with cardioversion.  He would also like ablation.  Risk and benefits were discussed.  He understands the risks and is agreed to the procedure.  Risk, benefits, and alternatives to EP study and radiofrequency/pulse field ablation for afib were also discussed in detail today. These risks include but are not limited to stroke, bleeding, vascular damage, tamponade, perforation, damage to the esophagus, lungs, and other structures, pulmonary vein stenosis, worsening renal function, and death.  The patient understands these risk and wishes to proceed.  We Cheree Fowles therefore proceed with catheter ablation at the next available time.  Carto, ICE, anesthesia are requested for the procedure.  Dewight Catino also obtain CT PV protocol prior to the procedure to exclude LAA thrombus and further evaluate atrial anatomy.  2.  High risk medication monitoring: Currently on dofetilide .  QTc remained stable.  3.  Obstructive sleep apnea: CPAP compliance  encouraged.  He has been referred for inspire device.  Follow up with Afib Clinic as usual post procedure  Signed, Lamiya Naas [name], MD

## 2024-04-26 NOTE — Patient Instructions (Signed)
 Medication Instructions:  Your physician recommends that you continue on your current medications as directed. Please refer to the Current Medication list given to you today.  *If you need a refill on your cardiac medications before your next appointment, please call your pharmacy*   Lab Work: Pre procedure labs for cardioversion today  Pre procedure labs for ablation -- we will call you to schedule:  BMP & CBC  If you have a lab test that is abnormal and we need to change your treatment, we will call you to review the results -- otherwise no news is good news.    Testing/Procedures:        You are scheduled for a Cardioversion on Friday, May 2 with Dr. ACHARYA.  Please arrive at the [facility] (Main Entrance A) at [facility]: [address] [place], [place] [address] at 12:00 PM (This time is 1 hour(s) before your procedure to ensure your preparation).   Free valet parking service is available. You will check in at ADMITTING.   *Please Note: You will receive a call the day before your procedure to confirm the appointment time. That time may have changed from the original time based on the schedule for that day.*    DIET:  Nothing to eat or drink after midnight except a sip of water with medications (see medication instructions below)  MEDICATION INSTRUCTIONS: !!IF ANY NEW MEDICATIONS ARE STARTED AFTER TODAY, PLEASE NOTIFY YOUR PROVIDER AS SOON AS POSSIBLE!!  FYI: Medications such as Semaglutide (Ozempic, [place]), Tirzepatide (Mounjaro, Zepbound), Dulaglutide (Trulicity), etc ("GLP1 agonists") AND Canagliflozin (Invokana), Dapagliflozin (Farxiga), Empagliflozin (Jardiance), Ertugliflozin (Steglatro), Bexagliflozin [employer]) or any combination with one of these drugs such as Invokamet (Canagliflozin/Metformin), Synjardy (Empagliflozin/Metformin), etc ("SGLT2 inhibitors") must be held around the time of a procedure. This is not a comprehensive list of all of these drugs.  Please review all of your medications and talk to your provider if you take any one of these. If you are not sure, ask your provider.  {   Continue taking your anticoagulant (blood thinner): Rivaroxaban  (Xarelto ).  You will need to continue this after your procedure until you are told by your provider that it is safe to stop.    LABS: Your physician recommends that you HAVE LAB WORK TODAY    FYI:  For your safety, and to allow us  to monitor your vital signs accurately during the surgery/procedure we request: If you have artificial nails, gel coating, SNS etc, please have those removed prior to your surgery/procedure. Not having the nail coverings /polish removed may result in cancellation or delay of your surgery/procedure.  Your support person will be asked to wait in the waiting room during your procedure.  It is OK to have someone drop you off and come back when you are ready to be discharged.  You cannot drive after the procedure and will need someone to drive you home.  Bring your insurance cards.  *Special Note: Every effort is made to have your procedure done on time. Occasionally there are emergencies that occur at the hospital that may cause delays. Please be patient if a delay does occur.      Your physician has requested that you have cardiac CT 1 month PRIOR to your ablation. Cardiac computed tomography (CT) is a painless test that uses an x-ray machine to take clear, detailed pictures of your heart. We will contact you if the result is abnormal. We will call you to schedule.  Your physician has  recommended that you have an ablation. Catheter ablation is a medical procedure used to treat some cardiac arrhythmias (irregular heartbeats). During catheter ablation, a long, thin, flexible tube is put into a blood vessel in your groin (upper thigh), or neck. This tube is called an ablation catheter. It is then guided to your heart through the blood vessel. Radio frequency waves destroy  small areas of heart tissue where abnormal heartbeats may cause an arrhythmia to start.   Your ablation is scheduled for 08/03/24. Please arrive at [facility] at 6:30 am.  We will call/send instructions at a later date.   Follow-Up: At [facility], you and your health needs are our priority.  As part of our continuing mission to provide you with exceptional heart care, we have created designated Provider Care Teams.  These Care Teams include your primary Cardiologist (physician) and Advanced Practice Providers (APPs -  Physician Assistants and Nurse Practitioners) who all work together to provide you with the care you need, when you need it.  Your next appointment:   1 month(s) after your ablation  The format for your next appointment:   In Person  Provider:   [facility] clinic   Thank you for choosing [facility]!!   Reece Cane, RN [phone]    Other Instructions   Cardiac Ablation Cardiac ablation is a procedure to destroy (ablate) some heart tissue that is sending bad signals. These bad signals cause problems in heart rhythm. The heart has many areas that make these signals. If there are problems in these areas, they can make the heart beat in a way that is not normal. Destroying some tissues can help make the heart rhythm normal. Tell your doctor about: Any allergies you have. All medicines you are taking. These include vitamins, herbs, eye drops, creams, and over-the-counter medicines. Any problems you or family members have had with medicines that make you fall asleep (anesthetics). Any blood disorders you have. Any surgeries you have had. Any medical conditions you have, such as kidney failure. Whether you are pregnant or may be pregnant. What are the risks? This is a safe procedure. But problems may occur, including: Infection. Bruising and bleeding. Bleeding into the chest. Stroke or blood clots. Damage to nearby areas of your body. Allergies to  medicines or dyes. The need for a pacemaker if the normal system is damaged. Failure of the procedure to treat the problem. What happens before the procedure? Medicines Ask your doctor about: Changing or stopping your normal medicines. This is important. Taking aspirin  and ibuprofen. Do not take these medicines unless your doctor tells you to take them. Taking other medicines, vitamins, herbs, and supplements. General instructions Follow instructions from your doctor about what you cannot eat or drink. Plan to have someone take you home from the hospital or clinic. If you will be going home right after the procedure, plan to have someone with you for 24 hours. Ask your doctor what steps will be taken to prevent infection. What happens during the procedure?  An IV tube will be put into one of your veins. You will be given a medicine to help you relax. The skin on your neck or groin will be numbed. A cut (incision) will be made in your neck or groin. A needle will be put through your cut and into a large vein. A tube (catheter) will be put into the needle. The tube will be moved to your heart. Dye may be put through the tube. This  helps your doctor see your heart. Small devices (electrodes) on the tube will send out signals. A type of energy will be used to destroy some heart tissue. The tube will be taken out. Pressure will be held on your cut. This helps stop bleeding. A bandage will be put over your cut. The exact procedure may vary among doctors and hospitals. What happens after the procedure? You will be watched until you leave the hospital or clinic. This includes checking your heart rate, breathing rate, oxygen, and blood pressure. Your cut will be watched for bleeding. You will need to lie still for a few hours. Do not drive for 24 hours or as long as your doctor tells you. Summary Cardiac ablation is a procedure to destroy some heart tissue. This is done to treat heart rhythm  problems. Tell your doctor about any medical conditions you may have. Tell him or her about all medicines you are taking to treat them. This is a safe procedure. But problems may occur. These include infection, bruising, bleeding, and damage to nearby areas of your body. Follow what your doctor tells you about food and drink. You may also be told to change or stop some of your medicines. After the procedure, do not drive for 24 hours or as long as your doctor tells you. This information is not intended to replace advice given to you by your health care provider. Make sure you discuss any questions you have with your health care provider. Document Revised: 03/07/2022 Document Reviewed: 11/17/2019 Elsevier Patient Education  2023 Elsevier Inc.   Cardiac Ablation, Care After  This sheet gives you information about how to care for yourself after your procedure. Your health care provider may also give you more specific instructions. If you have problems or questions, contact your health care provider. What can I expect after the procedure? After the procedure, it is common to have: Bruising around your puncture site. Tenderness around your puncture site. Skipped heartbeats. If you had an atrial fibrillation ablation, you may have atrial fibrillation during the first several months after your procedure.  Tiredness (fatigue).  Follow these instructions at home: Puncture site care  Follow instructions from your health care provider about how to take care of your puncture site. Make sure you: If present, leave stitches (sutures), skin glue, or adhesive strips in place. These skin closures may need to stay in place for up to 2 weeks. If adhesive strip edges start to loosen and curl up, you may trim the loose edges. Do not remove adhesive strips completely unless your health care provider tells you to do that. If a large square bandage is present, this may be removed 24 hours after surgery.  Check  your puncture site every day for signs of infection. Check for: Redness, swelling, or pain. Fluid or blood. If your puncture site starts to bleed, lie down on your back, apply firm pressure to the area, and contact your health care provider. Warmth. Pus or a bad smell. A pea or small marble sized lump at the site is normal and can take up to three months to resolve.  Driving Do not drive for at least 4 days after your procedure or however long your health care provider recommends. (Do not resume driving if you have previously been instructed not to drive for other health reasons.) Do not drive or use heavy machinery while taking prescription pain medicine. Activity Avoid activities that take a lot of effort for at least 7 days after  your procedure. Do not lift anything that is heavier than 5 lb (4.5 kg) for one week.  No sexual activity for 1 week.  Return to your normal activities as told by your health care provider. Ask your health care provider what activities are safe for you. General instructions Take over-the-counter and prescription medicines only as told by your health care provider. Do not use any products that contain nicotine or tobacco, such as cigarettes and e-cigarettes. If you need help quitting, ask your health care provider. You may shower after 24 hours, but Do not take baths, swim, or use a hot tub for 1 week.  Do not drink alcohol for 24 hours after your procedure. Keep all follow-up visits as told by your health care provider. This is important. Contact a health care provider if: You have redness, mild swelling, or pain around your puncture site. You have fluid or blood coming from your puncture site that stops after applying firm pressure to the area. Your puncture site feels warm to the touch. You have pus or a bad smell coming from your puncture site. You have a fever. You have chest pain or discomfort that spreads to your neck, jaw, or arm. You have chest pain  that is worse with lying on your back or taking a deep breath. You are sweating a lot. You feel nauseous. You have a fast or irregular heartbeat. You have shortness of breath. You are dizzy or light-headed and feel the need to lie down. You have pain or numbness in the arm or leg closest to your puncture site. Get help right away if: Your puncture site suddenly swells. Your puncture site is bleeding and the bleeding does not stop after applying firm pressure to the area. These symptoms may represent a serious problem that is an emergency. Do not wait to see if the symptoms will go away. Get medical help right away. Call your local emergency services (911 in the [place] Do not drive yourself to the hospital. Summary After the procedure, it is normal to have bruising and tenderness at the puncture site in your groin, neck, or forearm. Check your puncture site every day for signs of infection. Get help right away if your puncture site is bleeding and the bleeding does not stop after applying firm pressure to the area. This is a medical emergency. This information is not intended to replace advice given to you by your health care provider. Make sure you discuss any questions you have with your health care provider.

## 2024-04-27 ENCOUNTER — Ambulatory Visit (HOSPITAL_COMMUNITY): Admitting: Internal Medicine

## 2024-04-27 LAB — BASIC METABOLIC PANEL WITH GFR
BUN/Creatinine Ratio: 16 (ref 10–24)
BUN: 17 mg/dL (ref 8–27)
CO2: 25 mmol/L (ref 20–29)
Calcium: 9.7 mg/dL (ref 8.6–10.2)
Chloride: 97 mmol/L (ref 96–106)
Creatinine, Ser: 1.05 mg/dL (ref 0.76–1.27)
Glucose: 85 mg/dL (ref 70–99)
Potassium: 4.8 mmol/L (ref 3.5–5.2)
Sodium: 137 mmol/L (ref 134–144)
eGFR: 77 mL/min/{1.73_m2} (ref >59)

## 2024-04-27 LAB — CBC
Hematocrit: 45.9 % (ref 37.5–51.0)
Hemoglobin: 15.5 g/dL (ref 13.0–17.7)
MCH: 30.6 pg (ref 26.6–33.0)
MCHC: 33.8 g/dL (ref 31.5–35.7)
MCV: 91 fL (ref 79–97)
Platelets: 309 10*3/uL (ref 150–450)
RBC: 5.07 x10E6/uL (ref 4.14–5.80)
RDW: 13.3 % (ref 11.6–15.4)
WBC: 8.8 10*3/uL (ref 3.4–10.8)

## 2024-04-28 NOTE — Progress Notes (Signed)
 Pt called for pre procedure instructions. Arrival time 1130 NPO after midnight explained Instructed to take am meds with sip of water and confirmed blood thinner consistency. Instructed pt need for ride home tomorrow and have responsible adult with them for 24 hrs post procedure.

## 2024-04-29 ENCOUNTER — Ambulatory Visit (HOSPITAL_COMMUNITY): Admitting: Anesthesiology

## 2024-04-29 ENCOUNTER — Ambulatory Visit (HOSPITAL_COMMUNITY)
Admission: RE | Admit: 2024-04-29 | Discharge: 2024-04-29 | Disposition: A | Discharge status: Home / Self Care | Attending: Internal Medicine | Admitting: Internal Medicine

## 2024-04-29 ENCOUNTER — Other Ambulatory Visit: Payer: Self-pay

## 2024-04-29 ENCOUNTER — Encounter (HOSPITAL_COMMUNITY)
Admission: RE | Disposition: A | Discharge status: Home / Self Care | Payer: Self-pay | Source: Home / Self Care | Attending: Internal Medicine

## 2024-04-29 DIAGNOSIS — Z7901 Long term (current) use of anticoagulants: Secondary | ICD-10-CM | POA: Diagnosis not present

## 2024-04-29 DIAGNOSIS — I4892 Unspecified atrial flutter: Secondary | ICD-10-CM

## 2024-04-29 DIAGNOSIS — I4891 Unspecified atrial fibrillation: Secondary | ICD-10-CM

## 2024-04-29 DIAGNOSIS — I483 Typical atrial flutter: Secondary | ICD-10-CM | POA: Diagnosis present

## 2024-04-29 DIAGNOSIS — G4733 Obstructive sleep apnea (adult) (pediatric): Secondary | ICD-10-CM | POA: Diagnosis not present

## 2024-04-29 DIAGNOSIS — E785 Hyperlipidemia, unspecified: Secondary | ICD-10-CM | POA: Insufficient documentation

## 2024-04-29 DIAGNOSIS — I4819 Other persistent atrial fibrillation: Secondary | ICD-10-CM | POA: Diagnosis present

## 2024-04-29 DIAGNOSIS — Z79899 Other long term (current) drug therapy: Secondary | ICD-10-CM | POA: Insufficient documentation

## 2024-04-29 DIAGNOSIS — F419 Anxiety disorder, unspecified: Secondary | ICD-10-CM

## 2024-04-29 HISTORY — PX: CARDIOVERSION: EP1203

## 2024-04-29 SURGERY — CARDIOVERSION (CATH LAB)
Anesthesia: Monitor Anesthesia Care

## 2024-04-29 MED ORDER — LIDOCAINE 2% (20 MG/ML) 5 ML SYRINGE
INTRAMUSCULAR | Status: DC | PRN
  Administered 2024-04-29: 100 mg via INTRAVENOUS

## 2024-04-29 MED ORDER — PROPOFOL 10 MG/ML IV BOLUS
INTRAVENOUS | Status: DC | PRN
  Administered 2024-04-29: 60 mg via INTRAVENOUS

## 2024-04-29 SURGICAL SUPPLY — 1 items: PAD DEFIB RADIO PHYSIO CONN (PAD) ×1 IMPLANT

## 2024-04-29 NOTE — Transfer of Care (Signed)
 Immediate Anesthesia Transfer of Care Note  Patient: Johnathan [name]  Procedure(s) Performed: CARDIOVERSION  Patient Location: PACU and Cath Lab  Anesthesia Type:MAC  Level of Consciousness: drowsy and patient cooperative  Airway & Oxygen Therapy: Patient Spontanous Breathing and Patient connected to nasal cannula oxygen  Post-op Assessment: Report given to RN and Post -op Vital signs reviewed and stable  Post vital signs: Reviewed and stable  Last Vitals:  Vitals Value Taken Time  BP    Temp    Pulse 102 04/29/24 1357  Resp 15 04/29/24 1357  SpO2 97 % 04/29/24 1357  Vitals shown include unfiled device data.  Last Pain:  Vitals:   04/29/24 1224  TempSrc:   PainSc: (P) 0-No pain         Complications: No notable events documented.

## 2024-04-29 NOTE — Discharge Instructions (Signed)

## 2024-04-29 NOTE — CV Procedure (Signed)
 Procedure: Electrical Cardioversion Indications:  Atrial Fibrillation  Procedure Details:  Consent: Risks of procedure as well as the alternatives and risks of each were explained to the (patient/caregiver).  Consent for procedure obtained.  Time Out: Verified patient identification, verified procedure, site/side was marked, verified correct patient position, special equipment/implants available, medications/allergies/relevent history reviewed, required imaging and test results available. PERFORMED.  Patient placed on cardiac monitor, pulse oximetry, supplemental oxygen as necessary.  Sedation given:  propofol  per anesthesia Pacer pads placed anterior and posterior chest.  Cardioverted 1 time(s).  Cardioversion with synchronized biphasic 360J shock.  Evaluation: Findings: Post procedure EKG shows: NSR Complications: None Patient did tolerate procedure well.  Time Spent Directly with the Patient:  30 minutes   Dezzie Badilla A Ameris Akamine 04/29/2024, 2:18 PM

## 2024-04-29 NOTE — Interval H&P Note (Signed)
 History and Physical Interval Note:  04/29/2024 12:34 PM  Johnathan [name]  has presented today for surgery, with the diagnosis of afib, aflutter.  The various methods of treatment have been discussed with the patient and family. After consideration of risks, benefits and other options for treatment, the patient has consented to  Procedure(s): CARDIOVERSION (N/A) as a surgical intervention.  The patient's history has been reviewed, patient examined, no change in status, stable for surgery.  I have reviewed the patient's chart and labs.  Questions were answered to the patient's satisfaction.     Audric Venn A Lanaiya Lantry

## 2024-04-29 NOTE — Anesthesia Preprocedure Evaluation (Signed)
 Anesthesia Evaluation  Patient identified by MRN, date of birth, ID band Patient awake    Reviewed: Allergy & Precautions, NPO status , Patient's Chart, lab work & pertinent test results  Airway Mallampati: II  TM Distance: >3 FB Neck ROM: Full    Dental  (+) Missing   Pulmonary sleep apnea and Continuous Positive Airway Pressure Ventilation    Pulmonary exam normal        Cardiovascular + dysrhythmias Atrial Fibrillation  Rhythm:Irregular Rate:Tachycardia     Neuro/Psych   Anxiety     negative neurological ROS     GI/Hepatic negative GI ROS, Neg liver ROS,,,  Endo/Other  negative endocrine ROS    Renal/GU negative Renal ROS     Musculoskeletal negative musculoskeletal ROS (+)    Abdominal  (+) + obese  Peds  Hematology  (+) Blood dyscrasia (Xarelto )   Anesthesia Other Findings A-fib A-flutter  Reproductive/Obstetrics                             Anesthesia Physical Anesthesia Plan  ASA: 4  Anesthesia Plan: MAC   Post-op Pain Management:    Induction:   PONV Risk Score and Plan: 1 and Propofol  infusion and Treatment may vary due to age or medical condition  Airway Management Planned: Nasal Cannula  Additional Equipment:   Intra-op Plan:   Post-operative Plan:   Informed Consent: I have reviewed the patients History and Physical, chart, labs and discussed the procedure including the risks, benefits and alternatives for the proposed anesthesia with the patient or authorized representative who has indicated his/her understanding and acceptance.     Dental advisory given  Plan Discussed with: CRNA  Anesthesia Plan Comments:        Anesthesia Quick Evaluation

## 2024-04-30 ENCOUNTER — Encounter (HOSPITAL_COMMUNITY): Payer: Self-pay | Admitting: Internal Medicine

## 2024-05-02 NOTE — Anesthesia Postprocedure Evaluation (Signed)
 Anesthesia Post Note  Patient: Johnathan [name]  Procedure(s) Performed: CARDIOVERSION     Patient location during evaluation: Cath Lab Anesthesia Type: MAC Level of consciousness: awake Pain management: pain level controlled Vital Signs Assessment: post-procedure vital signs reviewed and stable Respiratory status: spontaneous breathing, nonlabored ventilation and respiratory function stable Cardiovascular status: blood pressure returned to baseline and stable Postop Assessment: no apparent nausea or vomiting Anesthetic complications: no   No notable events documented.  Last Vitals:  Vitals:   04/29/24 1420 04/29/24 1430  BP: 121/80 119/88  Pulse: 62 61  Resp: 20 (!) 9  Temp:    SpO2: 97% 98%    Last Pain:  Vitals:   04/29/24 1430  TempSrc:   PainSc: 0-No pain                 Jaydalyn Demattia P Alonia Dibuono

## 2024-05-03 NOTE — Telephone Encounter (Signed)
 Patient is returning call.

## 2024-05-03 NOTE — Telephone Encounter (Signed)
 Left message to call back

## 2024-05-05 NOTE — Telephone Encounter (Signed)
 Spoke to pt, he would like to schedule for 8/12.  Aware will make the change, CT can stay where it is currently scheduled and office will get him CT & procedure information at a later date.  Patient verbalized understanding and agreeable to plan.

## 2024-05-18 ENCOUNTER — Encounter (INDEPENDENT_AMBULATORY_CARE_PROVIDER_SITE_OTHER): Payer: Self-pay | Admitting: Otolaryngology

## 2024-05-18 ENCOUNTER — Ambulatory Visit (INDEPENDENT_AMBULATORY_CARE_PROVIDER_SITE_OTHER): Admitting: Otolaryngology

## 2024-05-18 VITALS — BP 154/90 | HR 66 | Ht 73.0 in | Wt 228.0 lb

## 2024-05-18 DIAGNOSIS — J342 Deviated nasal septum: Secondary | ICD-10-CM

## 2024-05-18 DIAGNOSIS — J343 Hypertrophy of nasal turbinates: Secondary | ICD-10-CM

## 2024-05-18 DIAGNOSIS — Z91198 Patient's noncompliance with other medical treatment and regimen for other reason: Secondary | ICD-10-CM

## 2024-05-18 DIAGNOSIS — H9190 Unspecified hearing loss, unspecified ear: Secondary | ICD-10-CM

## 2024-05-18 DIAGNOSIS — R0982 Postnasal drip: Secondary | ICD-10-CM

## 2024-05-18 DIAGNOSIS — Z789 Other specified health status: Secondary | ICD-10-CM

## 2024-05-18 DIAGNOSIS — R0981 Nasal congestion: Secondary | ICD-10-CM

## 2024-05-18 DIAGNOSIS — G4733 Obstructive sleep apnea (adult) (pediatric): Secondary | ICD-10-CM

## 2024-05-18 NOTE — Progress Notes (Signed)
 ENT CONSULT:  Reason for Consult: OSA CPAP intolerance  Ref: PCP Minnie Amber, PA, Dr Loetta Ringer Cards read HST in 2024   HPI: Discussed the use of AI scribe software for clinical note transcription with the patient, who gave verbal consent to proceed.  History of Present Illness Johnathan [name] "Johnathan [name]" is a 68 year old male with hx of obstructive sleep apnea who presents for evaluation of CPAP intolerance. He was referred by his primary care doctor for evaluation of his sleep apnea and CPAP intolerance  He has a history of sleep apnea diagnosed in 2012 or 2013. Initially, he used a CPAP machine but discontinued due to intolerance. He has tried various CPAP masks, including nasal pillows and full face masks, but experiences issues such as claustrophobia and air leaks due to sleeping with his mouth open. Currently, he uses a full face mask but finds it uncomfortable and often removes it during sleep.  Not using the CPAP machine affects his heart rhythm, leading to episodes of atrial fibrillation. He is currently on Xarelto  for this condition. He has experienced two recent cardioversions, one in March and another two to three weeks ago, to restore normal heart rhythm. He is scheduled for a catheter ablation for atrial fibrillation on August 12, 2024.  He experiences difficulty staying asleep, which he describes as not being able to maintain sleep rather than difficulty falling asleep. He takes Lunesta  approximately every third day, which helps to some extent, but he does not feel well when taking it daily.  He has unilateral hearing loss and has been told by other doctors that it may be due to a neurological cause. He has undergone tests, including an MRI, and has tried hearing aids, but finds them ineffective, especially in noisy environments.  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 He is cautious about physical  activity due to his heart condition and is looking forward to improving his quality of life post-ablation. No history of strokes, heart attacks, or lung disease.     Records Reviewed:  PCP note 08/29/20 Assessment/plan: Jeovany Huitron is a 68 y.o. male present for cpe Permanent atrial fibrillation (HCC)/Pure hypercholesterolemia/obesity  Stable. >prescribed metoprolol  prn and Tikosyn  by cardiology Established w/ cards Dr. Katheryne Pane. Continue routine follow ups. continue daily asa 81.  Continue statin - labs DUE- cbc, bmp, tsh, lipid orders placed for collection week of 9/20 fasting.  Diet and exercise modifications discussed.    Anxiety - Stable.  - continuelexapro 5 mg QD - follow up every  5.5 mos.    OSA on CPAP - follows with  Pulmonology compliant with CPAP   Past Medical History:  Diagnosis Date   Arthritis    hands and feet   Atrial fib/flutter, transient (HCC)    patient takes tykosyn   Cutaneous skin tags 01/09/2022   Dysrhythmia    a-fib   Elevated LFTs    Hearing aid worn 2021   right   Hyperlipidemia    Persistent atrial fibrillation (HCC)    RLS (restless legs syndrome)    Sleep apnea    does not use CPAP    Past Surgical History:  Procedure Laterality Date   ATRIAL FIBRILLATION ABLATION  2015   BONE EXCISION Right 05/09/2021   Procedure: RIGHT TIBIA EXCISION;  Surgeon: Micheline Ahr, MD;  Location: [facility];  Service: Orthopedics;  Laterality: Right;   CARDIOVERSION N/A 03/03/2024  Procedure: CARDIOVERSION;  Surgeon: Jann Melody, MD;  Location: [facility];  Service: Cardiovascular;  Laterality: N/A;   CARDIOVERSION N/A 04/29/2024   Procedure: CARDIOVERSION;  Surgeon: Euell Herrlich, MD;  Location: [facility];  Service: Cardiovascular;  Laterality: N/A;   COLONOSCOPY     EXCISION MORTON'S NEUROMA Right 05/09/2021   Procedure: EXCISION OF RIGHT SECOND TOE MASS;  Surgeon: Donnamarie Gables, MD;  Location: [facility];  Service: Orthopedics;  Laterality: Right;   FOOT SURGERY Left    HERNIA REPAIR     LUMBAR DISC SURGERY  2012   L3-L4, No hardware   metatarsal fusion     right large toe   TRANSESOPHAGEAL ECHOCARDIOGRAM (CATH LAB) N/A 03/03/2024   Procedure: TRANSESOPHAGEAL ECHOCARDIOGRAM;  Surgeon: Jann Melody, MD;  Location: [facility];  Service: Cardiovascular;  Laterality: N/A;    Family History  Problem Relation Age of Onset   Diabetes Mother    Diabetes Brother    Early death Brother    Heart disease Brother    Colon cancer Neg Hx    Esophageal cancer Neg Hx    Rectal cancer Neg Hx    Stomach cancer Neg Hx    Liver disease Neg Hx    Pancreatic cancer Neg Hx    Colon polyps Neg Hx     Social History:  reports that he has never smoked. He has quit using smokeless tobacco.  His smokeless tobacco use included chew. He reports current alcohol use. He reports that he does not use drugs.  Allergies:  Allergies  Allergen Reactions   Sulfa Antibiotics     Unknown reaction in childhood    Medications: I have reviewed the patient's current medications.  The PMH, PSH, Medications, Allergies, and SH were reviewed and updated.  ROS: Constitutional: Negative for fever, weight loss and weight gain. Cardiovascular: Negative for chest pain and dyspnea on exertion. Respiratory: Is not experiencing shortness of breath at rest. Gastrointestinal: Negative for nausea and vomiting. Neurological: Negative for headaches. Psychiatric: The patient is not nervous/anxious  Blood pressure (!) 154/90, pulse 66, height 6\' 1"  (1.854 m), weight 228 lb (103.4 kg), SpO2 97%. Body mass index is 30.08 kg/m.  PHYSICAL EXAM:  Exam: General: Well-developed, well-nourished Respiratory Respiratory effort: Equal inspiration and expiration without stridor Cardiovascular Peripheral Vascular: Warm extremities with equal color/perfusion Eyes: No nystagmus with equal extraocular  motion bilaterally Neuro/Psych/Balance: Patient oriented to person, place, and time; Appropriate mood and affect; Gait is intact with no imbalance; Cranial nerves I-XII are intact Head and Face Inspection: Normocephalic and atraumatic without mass or lesion Palpation: Facial skeleton intact without bony stepoffs Salivary Glands: No mass or tenderness Facial Strength: Facial motility symmetric and full bilaterally ENT Pinna: External ear intact and fully developed External canal: Canal is patent with intact skin Tympanic Membrane: Clear and mobile External Nose: No scar or anatomic deformity Internal Nose: Septum is S-shaped. No polyp, or purulence. Mucosal edema and erythema present.  Bilateral inferior turbinate hypertrophy.  Lips, Teeth, and gums: Mucosa and teeth intact and viable TMJ: No pain to palpation with full mobility Oral cavity/oropharynx: No erythema or exudate, no lesions present 1+ tonsils Friedman 1-2 tongue position  Nasopharynx: No mass or lesion with intact mucosa Hypopharynx: Intact mucosa without pooling of secretions Larynx Glottic: Full true vocal cord mobility without lesion or mass Supraglottic: Normal appearing epiglottis and AE folds Interarytenoid Space: Moderate pachydermia&edema Subglottic Space: Patent without lesion or edema  Neck Neck and Trachea: Midline trachea without mass or lesion Thyroid : No mass or nodularity Lymphatics: No lymphadenopathy  Procedure: Preoperative diagnosis: OSA CPAP intolerance   Postoperative diagnosis:   Same  Procedure: Flexible fiberoptic laryngoscopy  Surgeon: Artice Last, MD  Anesthesia: Topical lidocaine  and Afrin Complications: None Condition is stable throughout exam  Indications and consent:  The patient presents to the clinic with above symptoms. Indirect laryngoscopy view was incomplete. Thus it was recommended that they undergo a flexible fiberoptic laryngoscopy. All of the risks, benefits, and  potential complications were reviewed with the patient preoperatively and verbal informed consent was obtained.  Procedure: The patient was seated upright in the clinic. Topical lidocaine  and Afrin were applied to the nasal cavity. After adequate anesthesia had occurred, I then proceeded to pass the flexible telescope into the nasal cavity. The nasal cavity was patent without rhinorrhea or polyp. The nasopharynx was also patent without mass or lesion. The base of tongue was visualized and was normal. There were no signs of pooling of secretions in the piriform sinuses. The true vocal folds were mobile bilaterally. There were no signs of glottic or supraglottic mucosal lesion or mass. There was moderate interarytenoid pachydermia and post cricoid edema. The telescope was then slowly withdrawn and the patient tolerated the procedure throughout.   Studies Reviewed: Home Sleep Study 09/13/23  - Moderate obstructive sleep apnea occurred during this study (AHI 29.3/h). There is a significant positional component with supine sleep AHI 41.2/h versus non-supine sleep AHI 8.7/h. - Mild oxygen desaturation was noted during this study to a nadir of 87%. - No snoring was audible during this study.  No central or mixed apneas noted  BMI of 30  Interpreted by Dr Loetta Ringer Cardiology   Assessment/Plan: Encounter Diagnoses  Name Primary?   Obstructive sleep apnea Yes   Intolerance of continuous positive airway pressure (CPAP) ventilation    Chronic nasal congestion    Post-nasal drip    Nasal septal deviation    Hypertrophy of both inferior nasal turbinates    Assessment & Plan Obstructive Sleep Apnea CPAP intolerance  Moderate obstructive sleep apnea CPAP intolerance and BMI of 30. He is a suitable for Inspire Implant. Previous CPAP intolerance 2/2 being claustrophobic and having air leak around the mask. OSA, moderate, without multilevel collapse, with failure to tolerate PAP therapy and/or more conservative  measures. Presence of smaller/absent tonsils and larger tongue position (Friedman tongue position or modified Mallampati) suggests that hypopharyngeal/retrolingual collapse is contributing to the patient's OSA. Reather Campbell, M et al. Staging of obstructive sleep apnea/hypopnea syndrome: a guide to appropriate treatment. Laryngoscope, 2004 Mar, 114(3):454-9. PMID: 72536644) Options including positional therapy, weight loss, oral appliances, PAP and surgical correction discussed. Pt is not an ideal candidate for oral appliance due to severity of OSA Pt could be a candidate for Hypoglossal nerve stimulation (Inspire therapy) pending DISE  - Schedule drug-induced sleep endoscopy to assess upper airway during sleep. - Proceed with Inspire Implant surgery if pre-approval criteria are met. - Coordinate with cardiologist regarding Xarelto  management pre- and post-surgery. - Advise avoidance of strenuous activities for four weeks post-surgery. - Schedule follow-up with sleep physician for device activation and titration post-surgery.  Atrial Fibrillation Atrial fibrillation with recent cardioversions. Catheter ablation scheduled in Aug 2025. Sleep apnea management crucial for sinus rhythm maintenance. Anticoagulation coordination needed for procedures. - Coordinate with cardiologist regarding Xarelto  management for upcoming Inspire Implant surgery and catheter ablation. - Proceed with catheter ablation as scheduled on August 16.  Unilateral  Hearing Loss Unilateral hearing loss with inadequate current hearing aids (bought at [facility]). CROS orBi-CORS hearing aids may improve hearing by rerouting sound to the better ear. He already had MRI in the past and it was negative  - Recommend consultation with a hearing aid dispensary for evaluation.  Thank you for allowing me to participate in the care of this patient. Please do not hesitate to contact me with any questions or concerns.   Artice Last,  MD Otolaryngology [facility] Phone: [phone] Fax: [phone]    05/18/2024, 3:43 PM

## 2024-05-18 NOTE — H&P (View-Only) (Signed)
 ENT CONSULT:  Reason for Consult: OSA CPAP intolerance  Ref: PCP Minnie Amber, PA, Dr Loetta Ringer Cards read HST in 2024   HPI: Discussed the use of AI scribe software for clinical note transcription with the patient, who gave verbal consent to proceed.  History of Present Illness Johnathan [name] "Johnathan [name]" is a 68 year old male with hx of obstructive sleep apnea who presents for evaluation of CPAP intolerance. He was referred by his primary care doctor for evaluation of his sleep apnea and CPAP intolerance  He has a history of sleep apnea diagnosed in 2012 or 2013. Initially, he used a CPAP machine but discontinued due to intolerance. He has tried various CPAP masks, including nasal pillows and full face masks, but experiences issues such as claustrophobia and air leaks due to sleeping with his mouth open. Currently, he uses a full face mask but finds it uncomfortable and often removes it during sleep.  Not using the CPAP machine affects his heart rhythm, leading to episodes of atrial fibrillation. He is currently on Xarelto  for this condition. He has experienced two recent cardioversions, one in March and another two to three weeks ago, to restore normal heart rhythm. He is scheduled for a catheter ablation for atrial fibrillation on August 12, 2024.  He experiences difficulty staying asleep, which he describes as not being able to maintain sleep rather than difficulty falling asleep. He takes Lunesta  approximately every third day, which helps to some extent, but he does not feel well when taking it daily.  He has unilateral hearing loss and has been told by other doctors that it may be due to a neurological cause. He has undergone tests, including an MRI, and has tried hearing aids, but finds them ineffective, especially in noisy environments.  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 He is cautious about physical  activity due to his heart condition and is looking forward to improving his quality of life post-ablation. No history of strokes, heart attacks, or lung disease.     Records Reviewed:  PCP note 08/29/20 Assessment/plan: Jeovany Huitron is a 68 y.o. male present for cpe Permanent atrial fibrillation (HCC)/Pure hypercholesterolemia/obesity  Stable. >prescribed metoprolol  prn and Tikosyn  by cardiology Established w/ cards Dr. Katheryne Pane. Continue routine follow ups. continue daily asa 81.  Continue statin - labs DUE- cbc, bmp, tsh, lipid orders placed for collection week of 9/20 fasting.  Diet and exercise modifications discussed.    Anxiety - Stable.  - continuelexapro 5 mg QD - follow up every  5.5 mos.    OSA on CPAP - follows with  Pulmonology compliant with CPAP   Past Medical History:  Diagnosis Date   Arthritis    hands and feet   Atrial fib/flutter, transient (HCC)    patient takes tykosyn   Cutaneous skin tags 01/09/2022   Dysrhythmia    a-fib   Elevated LFTs    Hearing aid worn 2021   right   Hyperlipidemia    Persistent atrial fibrillation (HCC)    RLS (restless legs syndrome)    Sleep apnea    does not use CPAP    Past Surgical History:  Procedure Laterality Date   ATRIAL FIBRILLATION ABLATION  2015   BONE EXCISION Right 05/09/2021   Procedure: RIGHT TIBIA EXCISION;  Surgeon: Micheline Ahr, MD;  Location: [facility];  Service: Orthopedics;  Laterality: Right;   CARDIOVERSION N/A 03/03/2024  Procedure: CARDIOVERSION;  Surgeon: Jann Melody, MD;  Location: [facility];  Service: Cardiovascular;  Laterality: N/A;   CARDIOVERSION N/A 04/29/2024   Procedure: CARDIOVERSION;  Surgeon: Euell Herrlich, MD;  Location: [facility];  Service: Cardiovascular;  Laterality: N/A;   COLONOSCOPY     EXCISION MORTON'S NEUROMA Right 05/09/2021   Procedure: EXCISION OF RIGHT SECOND TOE MASS;  Surgeon: Donnamarie Gables, MD;  Location: [facility];  Service: Orthopedics;  Laterality: Right;   FOOT SURGERY Left    HERNIA REPAIR     LUMBAR DISC SURGERY  2012   L3-L4, No hardware   metatarsal fusion     right large toe   TRANSESOPHAGEAL ECHOCARDIOGRAM (CATH LAB) N/A 03/03/2024   Procedure: TRANSESOPHAGEAL ECHOCARDIOGRAM;  Surgeon: Jann Melody, MD;  Location: [facility];  Service: Cardiovascular;  Laterality: N/A;    Family History  Problem Relation Age of Onset   Diabetes Mother    Diabetes Brother    Early death Brother    Heart disease Brother    Colon cancer Neg Hx    Esophageal cancer Neg Hx    Rectal cancer Neg Hx    Stomach cancer Neg Hx    Liver disease Neg Hx    Pancreatic cancer Neg Hx    Colon polyps Neg Hx     Social History:  reports that he has never smoked. He has quit using smokeless tobacco.  His smokeless tobacco use included chew. He reports current alcohol use. He reports that he does not use drugs.  Allergies:  Allergies  Allergen Reactions   Sulfa Antibiotics     Unknown reaction in childhood    Medications: I have reviewed the patient's current medications.  The PMH, PSH, Medications, Allergies, and SH were reviewed and updated.  ROS: Constitutional: Negative for fever, weight loss and weight gain. Cardiovascular: Negative for chest pain and dyspnea on exertion. Respiratory: Is not experiencing shortness of breath at rest. Gastrointestinal: Negative for nausea and vomiting. Neurological: Negative for headaches. Psychiatric: The patient is not nervous/anxious  Blood pressure (!) 154/90, pulse 66, height 6\' 1"  (1.854 m), weight 228 lb (103.4 kg), SpO2 97%. Body mass index is 30.08 kg/m.  PHYSICAL EXAM:  Exam: General: Well-developed, well-nourished Respiratory Respiratory effort: Equal inspiration and expiration without stridor Cardiovascular Peripheral Vascular: Warm extremities with equal color/perfusion Eyes: No nystagmus with equal extraocular  motion bilaterally Neuro/Psych/Balance: Patient oriented to person, place, and time; Appropriate mood and affect; Gait is intact with no imbalance; Cranial nerves I-XII are intact Head and Face Inspection: Normocephalic and atraumatic without mass or lesion Palpation: Facial skeleton intact without bony stepoffs Salivary Glands: No mass or tenderness Facial Strength: Facial motility symmetric and full bilaterally ENT Pinna: External ear intact and fully developed External canal: Canal is patent with intact skin Tympanic Membrane: Clear and mobile External Nose: No scar or anatomic deformity Internal Nose: Septum is S-shaped. No polyp, or purulence. Mucosal edema and erythema present.  Bilateral inferior turbinate hypertrophy.  Lips, Teeth, and gums: Mucosa and teeth intact and viable TMJ: No pain to palpation with full mobility Oral cavity/oropharynx: No erythema or exudate, no lesions present 1+ tonsils Friedman 1-2 tongue position  Nasopharynx: No mass or lesion with intact mucosa Hypopharynx: Intact mucosa without pooling of secretions Larynx Glottic: Full true vocal cord mobility without lesion or mass Supraglottic: Normal appearing epiglottis and AE folds Interarytenoid Space: Moderate pachydermia&edema Subglottic Space: Patent without lesion or edema  Neck Neck and Trachea: Midline trachea without mass or lesion Thyroid : No mass or nodularity Lymphatics: No lymphadenopathy  Procedure: Preoperative diagnosis: OSA CPAP intolerance   Postoperative diagnosis:   Same  Procedure: Flexible fiberoptic laryngoscopy  Surgeon: Artice Last, MD  Anesthesia: Topical lidocaine  and Afrin Complications: None Condition is stable throughout exam  Indications and consent:  The patient presents to the clinic with above symptoms. Indirect laryngoscopy view was incomplete. Thus it was recommended that they undergo a flexible fiberoptic laryngoscopy. All of the risks, benefits, and  potential complications were reviewed with the patient preoperatively and verbal informed consent was obtained.  Procedure: The patient was seated upright in the clinic. Topical lidocaine  and Afrin were applied to the nasal cavity. After adequate anesthesia had occurred, I then proceeded to pass the flexible telescope into the nasal cavity. The nasal cavity was patent without rhinorrhea or polyp. The nasopharynx was also patent without mass or lesion. The base of tongue was visualized and was normal. There were no signs of pooling of secretions in the piriform sinuses. The true vocal folds were mobile bilaterally. There were no signs of glottic or supraglottic mucosal lesion or mass. There was moderate interarytenoid pachydermia and post cricoid edema. The telescope was then slowly withdrawn and the patient tolerated the procedure throughout.   Studies Reviewed: Home Sleep Study 09/13/23  - Moderate obstructive sleep apnea occurred during this study (AHI 29.3/h). There is a significant positional component with supine sleep AHI 41.2/h versus non-supine sleep AHI 8.7/h. - Mild oxygen desaturation was noted during this study to a nadir of 87%. - No snoring was audible during this study.  No central or mixed apneas noted  BMI of 30  Interpreted by Dr Loetta Ringer Cardiology   Assessment/Plan: Encounter Diagnoses  Name Primary?   Obstructive sleep apnea Yes   Intolerance of continuous positive airway pressure (CPAP) ventilation    Chronic nasal congestion    Post-nasal drip    Nasal septal deviation    Hypertrophy of both inferior nasal turbinates    Assessment & Plan Obstructive Sleep Apnea CPAP intolerance  Moderate obstructive sleep apnea CPAP intolerance and BMI of 30. He is a suitable for Inspire Implant. Previous CPAP intolerance 2/2 being claustrophobic and having air leak around the mask. OSA, moderate, without multilevel collapse, with failure to tolerate PAP therapy and/or more conservative  measures. Presence of smaller/absent tonsils and larger tongue position (Friedman tongue position or modified Mallampati) suggests that hypopharyngeal/retrolingual collapse is contributing to the patient's OSA. Reather Campbell, M et al. Staging of obstructive sleep apnea/hypopnea syndrome: a guide to appropriate treatment. Laryngoscope, 2004 Mar, 114(3):454-9. PMID: [id number]) Options including positional therapy, weight loss, oral appliances, PAP and surgical correction discussed. Pt is not an ideal candidate for oral appliance due to severity of OSA Pt could be a candidate for Hypoglossal nerve stimulation (Inspire therapy) pending DISE  - Schedule drug-induced sleep endoscopy to assess upper airway during sleep. - Proceed with Inspire Implant surgery if pre-approval criteria are met. - Coordinate with cardiologist regarding Xarelto  management pre- and post-surgery. - Advise avoidance of strenuous activities for four weeks post-surgery. - Schedule follow-up with sleep physician for device activation and titration post-surgery.  Atrial Fibrillation Atrial fibrillation with recent cardioversions. Catheter ablation scheduled in Aug 2025. Sleep apnea management crucial for sinus rhythm maintenance. Anticoagulation coordination needed for procedures. - Coordinate with cardiologist regarding Xarelto  management for upcoming Inspire Implant surgery and catheter ablation. - Proceed with catheter ablation as scheduled on August 16.  Unilateral  Hearing Loss Unilateral hearing loss with inadequate current hearing aids (bought at [facility]). CROS orBi-CORS hearing aids may improve hearing by rerouting sound to the better ear. He already had MRI in the past and it was negative  - Recommend consultation with a hearing aid dispensary for evaluation.  Thank you for allowing me to participate in the care of this patient. Please do not hesitate to contact me with any questions or concerns.   Artice Last,  MD Otolaryngology [facility] Phone: [phone] Fax: [phone]    05/18/2024, 3:43 PM

## 2024-05-19 ENCOUNTER — Telehealth: Payer: Self-pay | Admitting: *Deleted

## 2024-05-19 NOTE — Telephone Encounter (Addendum)
   Patient Name: Johnathan [name]  DOB: 08/10/1956 MRN: [id number]  Primary Cardiologist: Lauro Portal, MD  Chart reviewed as part of pre-operative protocol coverage. Given past medical history and time since last visit, based on ACC/AHA guidelines, Abram Sax is at acceptable risk for the planned procedure without further cardiovascular testing.   Per Dr. Lawana Pray he is at acceptable risk for intermediate risk procedure.  He will need to be on uninterrupted anticoagulation for 3 weeks prior to his upcoming ablation scheduled 08/09/2024 and uninterrupted 3 months following ablation procedure.    The patient was advised that if he develops new symptoms prior to surgery to contact our office to arrange for a follow-up visit, and he verbalized understanding.  I will route this recommendation to the requesting party via Epic fax function and remove from pre-op pool.  Please call with questions.  Francene Ing, Retha Cast, NP 05/19/2024, 11:24 AM

## 2024-05-19 NOTE — Telephone Encounter (Signed)
   Pre-operative Risk Assessment    Patient Name: Johnathan [name]  DOB: 11/11/1956 MRN: [id number]   Date of last office visit: 04/26/2024 Date of next office visit: N/A   Request for Surgical Clearance    Procedure:  INSPIRE IMPLANT  Date of Surgery:  Clearance TBD                                Surgeon:  DR. Artice Last Surgeon's Group or Practice Name:  [facility] Phone number:  [phone] Fax number:  [phone]   Type of Clearance Requested:   - Medical  - Pharmacy:  Hold Rivaroxaban  (Xarelto ) NOT INDICATED   Type of Anesthesia:  General    Additional requests/questions:    Berenda Breaker   05/19/2024, 6:56 AM

## 2024-05-19 NOTE — Telephone Encounter (Signed)
 Good Morning Dr. Lawana Pray  We have received a surgical clearance request for Johnathan [name] for upcoming inspire implant. They were seen recently in clinic on 04/26/2024. Can you please comment on surgical clearance and guidance on holding his Xarelto  prior to his implant. Please forward you guidance and recommendations to P CV DIV PREOP   Thank you,  Charles Connor, NP

## 2024-05-27 ENCOUNTER — Other Ambulatory Visit: Payer: Self-pay

## 2024-05-27 ENCOUNTER — Encounter (HOSPITAL_COMMUNITY): Payer: Self-pay | Admitting: Orthopedic Surgery

## 2024-05-27 NOTE — Progress Notes (Signed)
 PCP - Adelia Homestead, MD Cardiologist - Lauro Portal, MD Electrophysiology  - Agatha Horsfall, MD  Chest x-ray - 02/14/24 EKG - 04/29/24 ECHO - 03/03/24  CPAP - Unable to tolerate  Blood Thinner Instructions: No need to stop Xarelto   Anesthesia review: Y  Patient verbally denies any shortness of breath, fever, cough and chest pain during phone call   -------------  SDW INSTRUCTIONS given:  Your procedure is scheduled on Tuesday, June 3rd.  Report to [facility] Main Entrance "A" at 0530 A.M., and check in at the Admitting office.  Call this number if you have problems the morning of surgery:  [phone]   Remember:  Do not eat after midnight the night before your surgery  You may drink clear liquids until 0430 the morning of your surgery.   Clear liquids allowed are: Water, Non-Citrus Juices (without pulp), Carbonated Beverages, Clear Tea, Black Coffee Only, and Gatorade    Take these medicines the morning of surgery with A SIP OF WATER  allopurinol  (ZYLOPRIM )  dofetilide  (TIKOSYN ) escitalopram  (LEXAPRO )     As of today, STOP taking any Aspirin  (unless otherwise instructed by your surgeon) Aleve , Naproxen , Ibuprofen, Motrin, Advil, Goody's, BC's, all herbal medications, fish oil, and all vitamins.                      Do not wear jewelry, make up, or nail polish            Do not wear lotions, powders, perfumes/colognes, or deodorant.            Do not shave 48 hours prior to surgery.  Men may shave face and neck.            Do not bring valuables to the hospital.            [facility] is not responsible for any belongings or valuables.  Do NOT Smoke (Tobacco/Vaping) 24 hours prior to your procedure If you use a CPAP at night, you may bring all equipment for your overnight stay.   Contacts, glasses, dentures or bridgework may not be worn into surgery.      For patients admitted to the hospital, discharge time will be determined by your treatment team.    Patients discharged the day of surgery will not be allowed to drive home, and someone needs to stay with them for 24 hours.    Special instructions:   [place]- Preparing For Surgery  Before surgery, you can play an important role. Because skin is not sterile, your skin needs to be as free of germs as possible. You can reduce the number of germs on your skin by washing with CHG (chlorahexidine gluconate) Soap before surgery.  CHG is an antiseptic cleaner which kills germs and bonds with the skin to continue killing germs even after washing.    Oral Hygiene is also important to reduce your risk of infection.  Remember - BRUSH YOUR TEETH THE MORNING OF SURGERY WITH YOUR REGULAR TOOTHPASTE  Please do not use if you have an allergy to CHG or antibacterial soaps. If your skin becomes reddened/irritated stop using the CHG.  Do not shave (including legs and underarms) for at least 48 hours prior to first CHG shower. It is OK to shave your face.  Please follow these instructions carefully.   Shower the NIGHT BEFORE SURGERY and the MORNING OF SURGERY with DIAL Soap.   Pat yourself dry with a CLEAN TOWEL.  Wear CLEAN  PAJAMAS to bed the night before surgery  Place CLEAN SHEETS on your bed the night of your first shower and DO NOT SLEEP WITH PETS.   Day of Surgery: Please shower morning of surgery  Wear Clean/Comfortable clothing the morning of surgery Do not apply any deodorants/lotions.   Remember to brush your teeth WITH YOUR REGULAR TOOTHPASTE.   Questions were answered. Patient verbalized understanding of instructions.

## 2024-05-30 NOTE — Anesthesia Preprocedure Evaluation (Addendum)
 Anesthesia Evaluation  Patient identified by MRN, date of birth, ID band Patient awake    Reviewed: Allergy & Precautions, NPO status , Patient's Chart, lab work & pertinent test results  History of Anesthesia Complications Negative for: history of anesthetic complications  Airway Mallampati: I  TM Distance: >3 FB Neck ROM: Full    Dental no notable dental hx. (+) Teeth Intact, Dental Advisory Given   Pulmonary sleep apnea (no cpap)    Pulmonary exam normal breath sounds clear to auscultation       Cardiovascular + CAD  Normal cardiovascular exam+ dysrhythmias Atrial Fibrillation  Rhythm:Irregular Rate:Abnormal     Neuro/Psych   Anxiety        GI/Hepatic Gilberts dz   Endo/Other    Renal/GU Lab Results      Component                Value               Date                         K                        4.8                 04/26/2024                  BUN                      17                  04/26/2024                CREATININE               1.05                04/26/2024                     CALCIUM                   9.7                 04/26/2024                           Musculoskeletal  (+) Arthritis  (Gout),    Abdominal   Peds  Hematology Lab Results      Component                Value               Date                      WBC                      8.8                 04/26/2024                HGB                      15.5                04/26/2024  HCT                      45.9                04/26/2024                MCV                      91                  04/26/2024                PLT                      309                 04/26/2024              Anesthesia Other Findings All: sulfa  Reproductive/Obstetrics                             Anesthesia Physical Anesthesia Plan  ASA: 3  Anesthesia Plan: MAC   Post-op Pain Management: Minimal or no pain  anticipated   Induction: Intravenous  PONV Risk Score and Plan: Treatment may vary due to age or medical condition, Propofol  infusion and TIVA  Airway Management Planned: Nasal Cannula and Natural Airway  Additional Equipment: None  Intra-op Plan:   Post-operative Plan:   Informed Consent: I have reviewed the patients History and Physical, chart, labs and discussed the procedure including the risks, benefits and alternatives for the proposed anesthesia with the patient or authorized representative who has indicated his/her understanding and acceptance.     Dental advisory given  Plan Discussed with: CRNA and Surgeon  Anesthesia Plan Comments: (PAT note written 05/30/2024 by Allison Zelenak, PA-C. Continusing Xarelto .  )       Anesthesia Quick Evaluation

## 2024-05-30 NOTE — Progress Notes (Signed)
 Anesthesia Chart Review: Dorenda Gandy  Case: [id number] Date/Time: 05/31/24 0715   Procedure: DRUG INDUCED SLEEP ENDOSCOPY   Anesthesia type: Choice   Diagnosis: Obstructive sleep apnea syndrome [G47.33]   Pre-op diagnosis: OBSTRUCTIVE SLEEP APNEA   [facility] / [facility]   Surgeons: Artice Last, MD       DISCUSSION: Patient is a 68 year old male scheduled for the above procedure.  History includes never smoker (former smokeless tobacco), afib/aflutter (afib diagnosed ~ 2004, s/p multiple DCCV->s/p ablation 09/27/12 @ [facility] with multiple DCCV before & after, last 05/12/16 with several self-limited episodes until 02/2024 and required DCCV 03/03/24 & 04/29/24), OSA (intolerant to CPAP), HLD, elevated LFTs Oletta Berry disease, elevated total bilirubin), gout, spinal surgery, right hearing aid, left tonsillitis with supraglottic edema (overnight admission 05/29/23).    Long time history of PAF. Last DCCV 04/29/24 with plans for ablation on 08/09/24. He in on Xarelto  and Tikosyn . Dr. Larkin Plumb aware and reached out to cardiology. Preoperative cardiology input outlined by Rejeana Card, NP on 05/19/24: "Given past medical history and time since last visit, based on ACC/AHA guidelines, Mitchell Iwanicki is at acceptable risk for the planned procedure without further cardiovascular testing.    Per Dr. Lawana Pray he is at acceptable risk for intermediate risk procedure.  He will need to be on uninterrupted anticoagulation for 3 weeks prior to his upcoming ablation scheduled 08/09/2024 and uninterrupted 3 months following ablation procedure..."  He is continuing Xarelto  for the drug-induced endoscopy.   Anesthesia team to evaluate on the day of surgery.    VS:  Wt Readings from Last 3 Encounters:  05/18/24 103.4 kg  04/29/24 104.3 kg  04/26/24 106.6 kg   BP Readings from Last 3 Encounters:  05/18/24 (!) 154/90  04/29/24 119/88  04/26/24 120/76   Pulse Readings from Last 3 Encounters:  05/18/24 66   04/29/24 61  04/26/24 93     PROVIDERS: Adelia Homestead, MD is PCP  Agatha Horsfall, MD is EP cardiologist. Also evaluted by Minnie Amber, PA-C in the [facility]. Lauro Portal, MD is primary cardiologist Legrand Puma, MD is GI Barkley Boot, MD is urologist   LABS:  Office Visit on 04/26/2024  Component Date Value Ref Range Status   WBC 04/26/2024 8.8  3.4 - 10.8 x10E3/uL Final   RBC 04/26/2024 5.07  4.14 - 5.80 x10E6/uL Final   Hemoglobin 04/26/2024 15.5  13.0 - 17.7 g/dL Final   Hematocrit 11/91/4782 45.9  37.5 - 51.0 % Final   MCV 04/26/2024 91  79 - 97 fL Final   MCH 04/26/2024 30.6  26.6 - 33.0 pg Final   MCHC 04/26/2024 33.8  31.5 - 35.7 g/dL Final   RDW 95/62/1308 13.3  11.6 - 15.4 % Final   Platelets 04/26/2024 309  150 - 450 x10E3/uL Final   Glucose 04/26/2024 85  70 - 99 mg/dL Final   BUN 65/78/4696 17  8 - 27 mg/dL Final   Creatinine, Ser 04/26/2024 1.05  0.76 - 1.27 mg/dL Final   eGFR 29/52/8413 77  >59 mL/min/1.73 Final   BUN/Creatinine Ratio 04/26/2024 16  10 - 24 Final   Sodium 04/26/2024 137  134 - 144 mmol/L Final   Potassium 04/26/2024 4.8  3.5 - 5.2 mmol/L Final   Chloride 04/26/2024 97  96 - 106 mmol/L Final   CO2 04/26/2024 25  20 - 29 mmol/L Final   Calcium  04/26/2024 9.7  8.6 - 10.2 mg/dL Final  K4M 0.1% on  03/03/24.   Home Sleep Study 09/13/23: IMPRESSIONS: - Moderate obstructive sleep apnea occurred during this study (AHI 29.3/h). There is a significant positional component with supine sleep AHI 41.2/h versus non-supine sleep AHI 8.7/h. - Mild oxygen desaturation was noted during this study to a nadir of 87%. - No snoring was audible during this study.   IMAGES: CT Chest (over read CT Cardiac Calcium  Scoring) 03/10/24: - Coronary atheromatous calcifications. - Mediastinum/Nodes: No suspicious adenopathy identified. Imaged mediastinal structures are unremarkable. - Lungs/Pleura: Calcified granuloma right middle lobe.  Imaged lungs are otherwise clear. No pleural effusion or pneumothorax. - Upper Abdomen: No acute abnormality. - Musculoskeletal: Minimal bilateral gynecomastia. No chest wall abnormality. No acute osseous findings. Thoracic degenerative changes. IMPRESSION: No acute extracardiac incidental findings.   EKG: 04/29/24: Sinus rhythm with Premature atrial complexes Left axis deviation Abnormal ECG When compared with ECG of 26-Apr-2024 14:50, PREVIOUS ECG IS PRESENT Since last tracing atrial flutter has replaced NSR Confirmed by Gaylyn Keas ([id number]) on 04/30/2024 5:27:12 PM   CV: CT Cardiac Calcium  Scoring 03/10/24: IMPRESSION: 1. Coronary calcium  score of 78.1. This was 44th percentile for age-, race-, and sex-matched controls. RECOMMENDATIONS:... If CAC is 1 to 99, it is reasonable to initiate statin therapy for patients >=51 years of age...    TEE 03/03/24: IMPRESSIONS   1. Left ventricular ejection fraction, by estimation, is 50 to 55%. The  left ventricle has low normal function.   2. Right ventricular systolic function is normal. The right ventricular  size is mildly enlarged.   3. No atrial kick on mitral TDI. No left atrial/left atrial appendage  thrombus was detected. The LAA emptying velocity was 60 cm/s.   4. The mitral valve is normal in structure. Trivial mitral valve  regurgitation. No evidence of mitral stenosis.   5. The aortic valve is tricuspid. Aortic valve regurgitation is trivial.  No aortic stenosis is present.   6. The inferior vena cava is normal in size with greater than 50%  respiratory variability, suggesting right atrial pressure of 3 mmHg.   7. NA and demonstrates No 3D acquistioin.    Past Medical History:  Diagnosis Date   Atrial fib/flutter, transient (HCC)    patient takes tykosyn   Cutaneous skin tags 01/09/2022   Dysrhythmia    a-fib   Elevated LFTs    Gout    hands and feet   Hearing aid worn 2021   right   Hyperlipidemia     Persistent atrial fibrillation (HCC)    RLS (restless legs syndrome)    Sleep apnea    does not use CPAP    Past Surgical History:  Procedure Laterality Date   ATRIAL FIBRILLATION ABLATION  2015   BONE EXCISION Right 05/09/2021   Procedure: RIGHT TIBIA EXCISION;  Surgeon: Micheline Ahr, MD;  Location: [facility];  Service: Orthopedics;  Laterality: Right;   CARDIOVERSION N/A 03/03/2024   Procedure: CARDIOVERSION;  Surgeon: Jann Melody, MD;  [facility];  Service: Cardiovascular;  Laterality: N/A;   CARDIOVERSION N/A 04/29/2024   Procedure: CARDIOVERSION;  Surgeon: Euell Herrlich, MD;  [facility];  Service: Cardiovascular;  Laterality: N/A;   COLONOSCOPY     EXCISION MORTON'S NEUROMA Right 05/09/2021   Procedure: EXCISION OF RIGHT SECOND TOE MASS;  Surgeon: Donnamarie Gables, MD;  Location:  SURGERY CENTER;  Service: Orthopedics;  Laterality: Right;   FOOT SURGERY Left    HERNIA REPAIR  LUMBAR DISC SURGERY  2012   L3-L4, No hardware   metatarsal fusion     right large toe   TRANSESOPHAGEAL ECHOCARDIOGRAM (CATH LAB) N/A 03/03/2024   Procedure: TRANSESOPHAGEAL ECHOCARDIOGRAM;  Surgeon: Jann Melody, MD;  [facility];  Service: Cardiovascular;  Laterality: N/A;    MEDICATIONS: No current facility-administered medications for this encounter.    allopurinol  (ZYLOPRIM ) 300 MG tablet   atorvastatin  (LIPITOR) 80 MG tablet   dofetilide  (TIKOSYN ) 500 MCG capsule   escitalopram  (LEXAPRO ) 5 MG tablet   eszopiclone  (LUNESTA ) 2 MG TABS tablet   ezetimibe  (ZETIA ) 10 MG tablet   Multiple Vitamin (MULTIVITAMIN WITH MINERALS) TABS tablet   rivaroxaban  (XARELTO ) 20 MG TABS tablet   diltiazem  (CARDIZEM ) 30 MG tablet   metoprolol  tartrate (LOPRESSOR ) 25 MG tablet   He is not taking Lopressor  or Cardizem .   Ella Gun, PA-C Surgical Short Stay/Anesthesiology [facility] Phone [phone] [facility] Phone [phone] 05/30/2024 10:54 AM

## 2024-05-31 ENCOUNTER — Encounter (HOSPITAL_COMMUNITY)
Admission: RE | Disposition: A | Discharge status: Home / Self Care | Payer: Self-pay | Source: Home / Self Care | Attending: Otolaryngology

## 2024-05-31 ENCOUNTER — Ambulatory Visit (HOSPITAL_COMMUNITY)
Admission: RE | Admit: 2024-05-31 | Discharge: 2024-05-31 | Disposition: A | Discharge status: Home / Self Care | Attending: Otolaryngology | Admitting: Otolaryngology

## 2024-05-31 ENCOUNTER — Encounter (HOSPITAL_COMMUNITY): Payer: Self-pay

## 2024-05-31 ENCOUNTER — Other Ambulatory Visit: Payer: Self-pay

## 2024-05-31 ENCOUNTER — Ambulatory Visit (HOSPITAL_COMMUNITY): Admitting: Vascular Surgery

## 2024-05-31 ENCOUNTER — Other Ambulatory Visit (INDEPENDENT_AMBULATORY_CARE_PROVIDER_SITE_OTHER): Payer: Self-pay | Admitting: Otolaryngology

## 2024-05-31 DIAGNOSIS — G4733 Obstructive sleep apnea (adult) (pediatric): Secondary | ICD-10-CM | POA: Diagnosis present

## 2024-05-31 DIAGNOSIS — F419 Anxiety disorder, unspecified: Secondary | ICD-10-CM | POA: Diagnosis not present

## 2024-05-31 DIAGNOSIS — I4821 Permanent atrial fibrillation: Secondary | ICD-10-CM | POA: Insufficient documentation

## 2024-05-31 DIAGNOSIS — Z7982 Long term (current) use of aspirin: Secondary | ICD-10-CM | POA: Insufficient documentation

## 2024-05-31 DIAGNOSIS — Z6830 Body mass index (BMI) 30.0-30.9, adult: Secondary | ICD-10-CM | POA: Diagnosis not present

## 2024-05-31 DIAGNOSIS — M109 Gout, unspecified: Secondary | ICD-10-CM | POA: Insufficient documentation

## 2024-05-31 DIAGNOSIS — E669 Obesity, unspecified: Secondary | ICD-10-CM | POA: Diagnosis not present

## 2024-05-31 DIAGNOSIS — I2510 Atherosclerotic heart disease of native coronary artery without angina pectoris: Secondary | ICD-10-CM | POA: Insufficient documentation

## 2024-05-31 DIAGNOSIS — L42 Pityriasis rosea: Secondary | ICD-10-CM | POA: Insufficient documentation

## 2024-05-31 DIAGNOSIS — I4891 Unspecified atrial fibrillation: Secondary | ICD-10-CM

## 2024-05-31 DIAGNOSIS — Z7901 Long term (current) use of anticoagulants: Secondary | ICD-10-CM | POA: Diagnosis not present

## 2024-05-31 DIAGNOSIS — E7800 Pure hypercholesterolemia, unspecified: Secondary | ICD-10-CM | POA: Insufficient documentation

## 2024-05-31 DIAGNOSIS — Z79899 Other long term (current) drug therapy: Secondary | ICD-10-CM | POA: Insufficient documentation

## 2024-05-31 DIAGNOSIS — Z789 Other specified health status: Secondary | ICD-10-CM

## 2024-05-31 HISTORY — DX: Gout, unspecified: M10.9

## 2024-05-31 HISTORY — PX: DRUG INDUCED ENDOSCOPY: SHX6808

## 2024-05-31 LAB — CBC
HCT: 42.8 % (ref 39.0–52.0)
Hemoglobin: 14.4 g/dL (ref 13.0–17.0)
MCH: 30.2 pg (ref 26.0–34.0)
MCHC: 33.6 g/dL (ref 30.0–36.0)
MCV: 89.7 fL (ref 80.0–100.0)
Platelets: 252 10*3/uL (ref 150–400)
RBC: 4.77 MIL/uL (ref 4.22–5.81)
RDW: 13.2 % (ref 11.5–15.5)
WBC: 8 10*3/uL (ref 4.0–10.5)
nRBC: 0 % (ref 0.0–0.2)

## 2024-05-31 LAB — BASIC METABOLIC PANEL WITH GFR
Anion gap: 9 (ref 5–15)
BUN: 16 mg/dL (ref 8–23)
CO2: 27 mmol/L (ref 22–32)
Calcium: 9.3 mg/dL (ref 8.9–10.3)
Chloride: 101 mmol/L (ref 98–111)
Creatinine, Ser: 1.05 mg/dL (ref 0.61–1.24)
GFR, Estimated: >60 mL/min (ref >60)
Glucose, Bld: 103 mg/dL — ABNORMAL HIGH (ref 70–99)
Potassium: 4 mmol/L (ref 3.5–5.1)
Sodium: 137 mmol/L (ref 135–145)

## 2024-05-31 SURGERY — DRUG INDUCED SLEEP ENDOSCOPY
Anesthesia: Monitor Anesthesia Care | Site: Nose

## 2024-05-31 MED ORDER — LIDOCAINE-EPINEPHRINE 1 %-1:100000 IJ SOLN
INTRAMUSCULAR | Status: AC
  Filled 2024-05-31: qty 1

## 2024-05-31 MED ORDER — LIDOCAINE-EPINEPHRINE 1 %-1:100000 IJ SOLN
INTRAMUSCULAR | Status: DC | PRN
  Administered 2024-05-31: 20 mL

## 2024-05-31 MED ORDER — OXYMETAZOLINE HCL 0.05 % NA SOLN
NASAL | Status: DC | PRN
  Administered 2024-05-31: 1 via TOPICAL

## 2024-05-31 MED ORDER — PROPOFOL 500 MG/50ML IV EMUL
INTRAVENOUS | Status: DC | PRN
  Administered 2024-05-31: 100 ug/kg/min via INTRAVENOUS

## 2024-05-31 MED ORDER — ACETAMINOPHEN 10 MG/ML IV SOLN: 1000.0000 mg | Freq: Once | INTRAVENOUS | Status: DC | PRN

## 2024-05-31 MED ORDER — CHLORHEXIDINE GLUCONATE 0.12 % MT SOLN
15.0000 mL | Freq: Once | OROMUCOSAL | Status: AC
  Administered 2024-05-31: 15 mL via OROMUCOSAL
  Filled 2024-05-31: qty 15

## 2024-05-31 MED ORDER — ONDANSETRON HCL 4 MG/2ML IJ SOLN: 4.0000 mg | Freq: Once | INTRAMUSCULAR | Status: DC | PRN

## 2024-05-31 MED ORDER — MIDAZOLAM HCL 2 MG/2ML IJ SOLN
INTRAMUSCULAR | Status: AC
  Filled 2024-05-31: qty 2

## 2024-05-31 MED ORDER — ORAL CARE MOUTH RINSE: 15.0000 mL | Freq: Once | OROMUCOSAL | Status: AC

## 2024-05-31 MED ORDER — LACTATED RINGERS IV SOLN: INTRAVENOUS | Status: DC

## 2024-05-31 MED ORDER — FENTANYL CITRATE (PF) 100 MCG/2ML IJ SOLN: 25.0000 ug | INTRAMUSCULAR | Status: DC | PRN

## 2024-05-31 MED ORDER — OXYMETAZOLINE HCL 0.05 % NA SOLN
NASAL | Status: AC
  Filled 2024-05-31: qty 30

## 2024-05-31 MED ORDER — PROPOFOL 10 MG/ML IV BOLUS
INTRAVENOUS | Status: DC | PRN
  Administered 2024-05-31 (×2): 10 mg via INTRAVENOUS

## 2024-05-31 SURGICAL SUPPLY — 1 items: SOL ANTI FOG 6CC (MISCELLANEOUS) ×1 IMPLANT

## 2024-05-31 NOTE — Op Note (Signed)
 Operative note  Preoperative diagnosis: OSA Postoperative diagnosis:   Same  Procedure:DISE ( Drug induced sleep endoscopy)  Anesthesia: Topical lidocaine  gel Complications: None Condition is stable throughout exam  Indications and consent:   The patient presents to my otolaryngology clinic with a chief complaint of OSA  Because of pt's OSA and desire to be a candidate for Inspire therapy, it was recommended that they undergo a flexible fiberoptic laryngoscopy under sedation (DISE).   All the risks, benefits, and potential complications were reviewed with the patient preoperatively and informed consent was obtained.  Procedure: Pt was brought back to the OR and laid supine on the table. Propofol  anesthesia was administered per protocol until pt reached optimal level of sedation. DISE exam showed the following anatomic collapse pattern.  VOTE classification Velopharynx- 2 A-P  Oropharynx- 2 Tongue base- 2 better with jaw thrust  Epiglottis- 2  There was no evidence of complete concentric collapse.   Based on the DISE findings, pt is a candidate for Hypoglossal nerve stimulation (Inspire therapy) based on the above anatomy.

## 2024-05-31 NOTE — Transfer of Care (Signed)
 Immediate Anesthesia Transfer of Care Note  Patient: Johnathan [name]  Procedure(s) Performed: DRUG INDUCED SLEEP ENDOSCOPY (Nose)  Patient Location: PACU  Anesthesia Type:MAC  Level of Consciousness: awake, alert , and oriented  Airway & Oxygen Therapy: Patient Spontanous Breathing  Post-op Assessment: Report given to RN and Post -op Vital signs reviewed and stable  Post vital signs: Reviewed and stable  Last Vitals:  Vitals Value Taken Time  BP 118/64 05/31/24 0733  Temp    Pulse 50 05/31/24 0735  Resp 18 05/31/24 0735  SpO2 97 % 05/31/24 0735  Vitals shown include unfiled device data.  Last Pain:  Vitals:   05/31/24 0620  PainSc: 0-No pain         Complications: No notable events documented.

## 2024-05-31 NOTE — Discharge Instructions (Signed)
 DRUG-INDUCED SLEEP ENDOSCOPY POST-OPERATIVE INSTRUCTIONS:  Based on the drug-induced sleep endoscopy today, you were deemed a candidate for Inspire Therapy.  Please review post-operative and recovery instructions below that you will need to be aware of after Inspire Implant surgery.  Please restart all of your home medications if you take anything on a daily basis.  You can resume regular diet after this procedure.  You will be scheduled for pre-operative appointment with Dr. Irene Pap to review details about surgery and to discuss the next steps.   DIET: Resume normal diet HYGIENE: Please wait until 48 hours after surgery before getting incisions on neck, chest, and torso wet. In the first 48 hours after surgery, will likely need to take sponge baths. WOUND CARE: Please leave pressure dressing on for 48 hours after surgery. Gently place antibiotic ointment over incisions 2 times per day; use clean q-tip. May place a clean bandage over incisions as needed. After 48 hours, you may get incisions wet with warm soap and water, but do not soak the incisions.  Pat area dry gently.  Immediately place antibiotic ointment. Take oral antibiotics as prescribed If skin around incision starts to get red (> 1cm), swollen, and/or more painful, please call the office ACTIVITY: Try to avoid sleeping on the side of your surgery, to the extent possible.   You may walk for exercise starting the day after surgery. For 2 weeks: Do not pick up anything greater than 5 pounds with the hand/arm that's on the same side as the surgery.  After 2 weeks, you may increase weight to 10 pounds.   Consider performing neck rolls 10 clockwise and 10 counterclockwise 3x/day. For 4 weeks, no strenuous activity (running, jogging, lifting weights, gardening, sports) or until cleared by physician.   PAIN MEDICATIONS: You will be prescribed xxx for pain.   If pain is not severe, consider taking Tylenol 650mg  every 6 hours Avoid  aspirin for 7 days after surgery Avoid direct heat (such as heating pads) to incision sites.   May gently place ice over surgery sites as needed.  Please place a thin clean towel over skin first and then place ice bag over towel.  Ice for 10 minutes at a time only.  POST-OPERATIVE CLINIC APPOINTMENTS: 1 week: suture removal and wound check in the office.  1 month: device activation and wound check in the office. 2.5 months: check in visit to assess usage. 3-4 months: titration sleep study based on usage of >4 hrs/night.  4 months: final wound check in the office.  Yearly: device check at office.  SCAR CARE: After incisions have healed, you will have a scar, which will continue to evolve over the course of 12 months.  Caring for your incision scars will help them to be as minimal as possible. If you are out in the sun with incision exposure, please remember to place sunscreen over the incision and surrounding skin.   You may use vitamin E or "Scar ointment/cream" to help soften scar.  Please wait one month after surgery before starting this.

## 2024-05-31 NOTE — Interval H&P Note (Signed)
 History and Physical Interval Note:  05/31/2024 7:09 AM  Johnathan [name]  has presented today for surgery, with the diagnosis of OBSTRUCTIVE SLEEP APNEA.  The various methods of treatment have been discussed with the patient and family. After consideration of risks, benefits and other options for treatment, the patient has consented to  Procedure(s): DRUG INDUCED SLEEP ENDOSCOPY (N/A) as a surgical intervention.  The patient's history has been reviewed, patient examined, no change in status, stable for surgery.  I have reviewed the patient's chart and labs.  Questions were answered to the patient's satisfaction.     Cigi Bega

## 2024-05-31 NOTE — Anesthesia Postprocedure Evaluation (Signed)
 Anesthesia Post Note  Patient: Johnathan [name]  Procedure(s) Performed: DRUG INDUCED SLEEP ENDOSCOPY (Nose)     Patient location during evaluation: PACU Anesthesia Type: MAC Level of consciousness: awake and alert Pain management: pain level controlled Vital Signs Assessment: post-procedure vital signs reviewed and stable Respiratory status: spontaneous breathing, nonlabored ventilation, respiratory function stable and patient connected to nasal cannula oxygen Cardiovascular status: stable and blood pressure returned to baseline Postop Assessment: no apparent nausea or vomiting Anesthetic complications: no  No notable events documented.  Last Vitals:  Vitals:   05/31/24 0745 05/31/24 0800  BP: 124/69 128/76  Pulse: (!) 52 (!) 51  Resp: 16 15  Temp:  36.7 C  SpO2: 98% 99%    Last Pain:  Vitals:   05/31/24 0800  PainSc: 0-No pain                 Rosalita Combe

## 2024-06-01 ENCOUNTER — Encounter (HOSPITAL_COMMUNITY): Payer: Self-pay | Admitting: Otolaryngology

## 2024-06-02 ENCOUNTER — Other Ambulatory Visit (INDEPENDENT_AMBULATORY_CARE_PROVIDER_SITE_OTHER): Payer: Self-pay | Admitting: Otolaryngology

## 2024-06-02 ENCOUNTER — Encounter (INDEPENDENT_AMBULATORY_CARE_PROVIDER_SITE_OTHER): Payer: Self-pay

## 2024-06-02 DIAGNOSIS — Z789 Other specified health status: Secondary | ICD-10-CM

## 2024-06-02 DIAGNOSIS — G4733 Obstructive sleep apnea (adult) (pediatric): Secondary | ICD-10-CM

## 2024-06-03 ENCOUNTER — Telehealth: Payer: Self-pay | Admitting: Cardiology

## 2024-06-03 NOTE — Telephone Encounter (Signed)
 Pt is requesting a callback regarding him wanting to have his sleep patch after the cath but he'll need to be off blood thinners. He'd like to discuss further with nurse. Please advise

## 2024-06-03 NOTE — Telephone Encounter (Signed)
 Pt is saying that he qualifies for the [facility] but cannot get it until September... he has planned Ablation for Aug 12... for Belva Boyden he will need to be off of his Xarelto  for 3 weeks prior... he is asking when he should plan this... based on when he can safely be off of the Xarelto  for that long if it before or after his Ablation.   I will send to Dr Lawana Pray and Catherin Closs for review.

## 2024-06-06 ENCOUNTER — Other Ambulatory Visit (INDEPENDENT_AMBULATORY_CARE_PROVIDER_SITE_OTHER): Payer: Self-pay | Admitting: Otolaryngology

## 2024-06-06 ENCOUNTER — Encounter (INDEPENDENT_AMBULATORY_CARE_PROVIDER_SITE_OTHER): Payer: Self-pay

## 2024-06-06 DIAGNOSIS — Z789 Other specified health status: Secondary | ICD-10-CM

## 2024-06-06 DIAGNOSIS — G4733 Obstructive sleep apnea (adult) (pediatric): Secondary | ICD-10-CM

## 2024-06-09 ENCOUNTER — Telehealth: Payer: Self-pay | Admitting: Cardiology

## 2024-06-09 NOTE — Telephone Encounter (Signed)
 Patient stated he wants to get an Inspire implant and wants a call back to discuss if he should get this before his ablation.

## 2024-06-09 NOTE — Telephone Encounter (Signed)
 Pt aware he will need to be on Xarelto  for a minimum of 3 weeks prior and 3 MONTHS post ablation.  Pt appreciates my call back and will discuss with his [facility] physician tomorrow.  He does not want to r/s ablation so he will work around ablation and not move it out any further.

## 2024-06-09 NOTE — Telephone Encounter (Signed)
 Spoke to patient he stated he is trying to get a Inspire device.He wanted to know if he needs to have Inspire before Ablation scheduled 8/12.He also wanted to know if ok to hold Xarelto  before Inspire device.He will have ENT fax over a clearance.Advised I will send message to [facility] for advice.

## 2024-06-09 NOTE — Telephone Encounter (Signed)
 Patient is following up requesting feedback regarding holding Xarelto . Please advise.

## 2024-06-09 NOTE — Telephone Encounter (Signed)
 Spoke to pt, see 6/6 telephone note.

## 2024-06-13 NOTE — Telephone Encounter (Signed)
 Received 2nd preop clearance request will route all notes to the surgeons office.

## 2024-06-15 NOTE — Telephone Encounter (Signed)
 Pt wants to discuss the date of his ablation

## 2024-06-15 NOTE — Telephone Encounter (Signed)
 Followed up with pt, who may have to r/s his procedure.  He is having another sleep study 7/28 and after will determine Inspire implant date.  He will call back and let us  know if will need to move 8/12 ablation.

## 2024-06-19 NOTE — Progress Notes (Deleted)
 06/21/24- 68 yoM for sleep evaluation courtesy of Dr Soldatova needing in-lab sleep study prior to [facility] implantation. Medical problems include- AFib, Hyperlipidemia, Obesity, BPH Has already passed DISE. Intolerant of CPAP. NPSG 528/13- AHI 17, desat to 84%, body weight 229 lbs Epworth score- Body weight today-

## 2024-06-21 ENCOUNTER — Encounter: Payer: Self-pay | Admitting: Internal Medicine

## 2024-06-21 ENCOUNTER — Ambulatory Visit: Admitting: Internal Medicine

## 2024-06-21 NOTE — Telephone Encounter (Signed)
 Spoke to pt several hours ago, around 2pm.  Aware I will place him in the October to schedule pile and office will call once we have the October schedule open.  Patient verbalized understanding and agreeable to plan.

## 2024-06-21 NOTE — Telephone Encounter (Signed)
 Pt is requesting a callback from [facility] regarding him wanting to move the ablation appt. Please advise

## 2024-06-22 ENCOUNTER — Telehealth: Payer: Self-pay | Admitting: Cardiology

## 2024-06-22 NOTE — Telephone Encounter (Signed)
 Patient states he had called because there was a cancellation this week for sleep study, but he was not able to get appt before it filled.  Patient asked if he could keep 8/12 ablation date in case he is able to get sleep study done sooner, but also to get on the books for October as discussed yesterday.

## 2024-06-22 NOTE — Telephone Encounter (Signed)
 Returned patient call.  He would like to keep August ablation date for now as he may be able to get his Elizabeth sooner that thought and still proceed with ablation in August.  He is going to let me know within next several weeks.  Aware will keep in the October stack as well, as a back up. We agreed to make a final decision by 7/11.

## 2024-06-22 NOTE — Telephone Encounter (Signed)
 Patient is calling about his 8/12 surgery. Please advise

## 2024-07-08 NOTE — Telephone Encounter (Signed)
 Please review and advise.

## 2024-07-08 NOTE — Telephone Encounter (Signed)
 Pt calling to report he is going to have the procedure next week and will restart his blood thinner by 7/21.  He would like to keep August ablation.  Advised to review his CT and ablation letters and call with any questions.  Patient verbalized understanding and agreeable to plan.

## 2024-07-08 NOTE — Telephone Encounter (Signed)
 Patient calling back to verify date. Please advise

## 2024-07-12 NOTE — Progress Notes (Signed)
 Surgical Instructions   Your procedure is scheduled on Friday July 15, 2024. Report to [facility] Main Entrance A at 10:45 A.M., then check in with the Admitting office. Any questions or running late day of surgery: call [phone]  Questions prior to your surgery date: call [phone], Monday-Friday, 8am-4pm. If you experience any cold or flu symptoms such as cough, fever, chills, shortness of breath, etc. between now and your scheduled surgery, please notify us  at the above number.     Remember:  Do not eat or drink after midnight the night before your surgery  Take these medicines the morning of surgery with A SIP OF WATER  allopurinol  (ZYLOPRIM )  atorvastatin  (LIPITOR)  dofetilide  (TIKOSYN )  escitalopram  (LEXAPRO )  ezetimibe  (ZETIA )   Follow your surgeon's instructions on when to stop rivaroxaban  (XARELTO ).  If no instructions were given by your surgeon then you will need to call the office to get those instructions.     One week prior to surgery, STOP taking any Aspirin  (unless otherwise instructed by your surgeon) Aleve , Naproxen , Ibuprofen, Motrin, Advil, Goody's, BC's, all herbal medications, fish oil, and non-prescription vitamins.                     Do NOT Smoke (Tobacco/Vaping) for 24 hours prior to your procedure.  If you use a CPAP at night, you may bring your mask/headgear for your overnight stay.   You will be asked to remove any contacts, glasses, piercing's, hearing aid's, dentures/partials prior to surgery. Please bring cases for these items if needed.    Patients discharged the day of surgery will not be allowed to drive home, and someone needs to stay with them for 24 hours.  SURGICAL WAITING ROOM VISITATION Patients may have no more than 2 support people in the waiting area - these visitors may rotate.   Pre-op nurse will coordinate an appropriate time for 1 ADULT support person, who may not rotate, to accompany patient in pre-op.  Children under the  age of 22 must have an adult with them who is not the patient and must remain in the main waiting area with an adult.  If the patient needs to stay at the hospital during part of their recovery, the visitor guidelines for inpatient rooms apply.  Please refer to the [facility] website for the visitor guidelines for any additional information.   If you received a COVID test during your pre-op visit  it is requested that you wear a mask when out in public, stay away from anyone that may not be feeling well and notify your surgeon if you develop symptoms. If you have been in contact with anyone that has tested positive in the last 10 days please notify you surgeon.      Pre-operative CHG Bathing Instructions   You can play a key role in reducing the risk of infection after surgery. Your skin needs to be as free of germs as possible. You can reduce the number of germs on your skin by washing with CHG (chlorhexidine  gluconate) soap before surgery. CHG is an antiseptic soap that kills germs and continues to kill germs even after washing.   DO NOT use if you have an allergy to chlorhexidine /CHG or antibacterial soaps. If your skin becomes reddened or irritated, stop using the CHG and notify one of our RNs at [phone].              TAKE A SHOWER THE NIGHT BEFORE SURGERY AND THE DAY  OF SURGERY    Please keep in mind the following:  You may shave your face before/day of surgery.  Place clean sheets on your bed the night before surgery Use a clean washcloth (not used since being washed) for each shower. DO NOT sleep with pet's night before surgery.  CHG Shower Instructions:  Wash your face and private area with normal soap. If you choose to wash your hair, wash first with your normal shampoo.  After you use shampoo/soap, rinse your hair and body thoroughly to remove shampoo/soap residue.  Turn the water OFF and apply half the bottle of CHG soap to a CLEAN washcloth.  Apply CHG soap ONLY FROM  YOUR NECK DOWN TO YOUR TOES (washing for 3-5 minutes)  DO NOT use CHG soap on face, private areas, open wounds, or sores.  Pay special attention to the area where your surgery is being performed.  If you are having back surgery, having someone wash your back for you may be helpful. Wait 2 minutes after CHG soap is applied, then you may rinse off the CHG soap.  Pat dry with a clean towel  Put on clean pajamas    Additional instructions for the day of surgery: DO NOT APPLY any lotions, deodorants or cologne.   Do not wear jewelry  Do not bring valuables to the hospital. [facility] is not responsible for valuables/personal belongings. Put on clean/comfortable clothes.  Please brush your teeth.  Ask your nurse before applying any prescription medications to the skin.

## 2024-07-13 ENCOUNTER — Encounter: Payer: Self-pay | Admitting: Cardiology

## 2024-07-13 ENCOUNTER — Other Ambulatory Visit (HOSPITAL_COMMUNITY)

## 2024-07-13 ENCOUNTER — Telehealth: Payer: Self-pay | Admitting: Cardiology

## 2024-07-13 ENCOUNTER — Encounter (HOSPITAL_COMMUNITY)
Admission: RE | Admit: 2024-07-13 | Discharge: 2024-07-13 | Disposition: A | Discharge status: Home / Self Care | Source: Ambulatory Visit | Attending: Otolaryngology | Admitting: Otolaryngology

## 2024-07-13 ENCOUNTER — Other Ambulatory Visit: Payer: Self-pay

## 2024-07-13 ENCOUNTER — Encounter (HOSPITAL_COMMUNITY): Payer: Self-pay

## 2024-07-13 VITALS — BP 148/77 | HR 74 | Temp 97.9°F | Resp 19 | Ht 73.0 in | Wt 234.4 lb

## 2024-07-13 DIAGNOSIS — H9191 Unspecified hearing loss, right ear: Secondary | ICD-10-CM | POA: Insufficient documentation

## 2024-07-13 DIAGNOSIS — G4733 Obstructive sleep apnea (adult) (pediatric): Secondary | ICD-10-CM | POA: Insufficient documentation

## 2024-07-13 DIAGNOSIS — Z79899 Other long term (current) drug therapy: Secondary | ICD-10-CM | POA: Diagnosis not present

## 2024-07-13 DIAGNOSIS — E785 Hyperlipidemia, unspecified: Secondary | ICD-10-CM | POA: Insufficient documentation

## 2024-07-13 DIAGNOSIS — Z01818 Encounter for other preprocedural examination: Secondary | ICD-10-CM

## 2024-07-13 DIAGNOSIS — I4892 Unspecified atrial flutter: Secondary | ICD-10-CM | POA: Insufficient documentation

## 2024-07-13 DIAGNOSIS — Z87891 Personal history of nicotine dependence: Secondary | ICD-10-CM | POA: Insufficient documentation

## 2024-07-13 DIAGNOSIS — Z7901 Long term (current) use of anticoagulants: Secondary | ICD-10-CM | POA: Insufficient documentation

## 2024-07-13 DIAGNOSIS — I4891 Unspecified atrial fibrillation: Secondary | ICD-10-CM | POA: Diagnosis not present

## 2024-07-13 DIAGNOSIS — R7989 Other specified abnormal findings of blood chemistry: Secondary | ICD-10-CM

## 2024-07-13 DIAGNOSIS — M109 Gout, unspecified: Secondary | ICD-10-CM | POA: Insufficient documentation

## 2024-07-13 DIAGNOSIS — Z01812 Encounter for preprocedural laboratory examination: Secondary | ICD-10-CM | POA: Insufficient documentation

## 2024-07-13 LAB — CBC
HCT: 45.4 % (ref 39.0–52.0)
Hemoglobin: 15.4 g/dL (ref 13.0–17.0)
MCH: 30.6 pg (ref 26.0–34.0)
MCHC: 33.9 g/dL (ref 30.0–36.0)
MCV: 90.3 fL (ref 80.0–100.0)
Platelets: 247 K/uL (ref 150–400)
RBC: 5.03 MIL/uL (ref 4.22–5.81)
RDW: 13.1 % (ref 11.5–15.5)
WBC: 7.9 K/uL (ref 4.0–10.5)
nRBC: 0 % (ref 0.0–0.2)

## 2024-07-13 LAB — COMPREHENSIVE METABOLIC PANEL WITH GFR
ALT: 25 U/L (ref 0–44)
AST: 24 U/L (ref 15–41)
Albumin: 3.8 g/dL (ref 3.5–5.0)
Alkaline Phosphatase: 100 U/L (ref 38–126)
Anion gap: 8 (ref 5–15)
BUN: 11 mg/dL (ref 8–23)
CO2: 28 mmol/L (ref 22–32)
Calcium: 9.6 mg/dL (ref 8.9–10.3)
Chloride: 99 mmol/L (ref 98–111)
Creatinine, Ser: 0.94 mg/dL (ref 0.61–1.24)
GFR, Estimated: >60 mL/min (ref >60)
Glucose, Bld: 95 mg/dL (ref 70–99)
Potassium: 4.1 mmol/L (ref 3.5–5.1)
Sodium: 135 mmol/L (ref 135–145)
Total Bilirubin: 1.8 mg/dL — ABNORMAL HIGH (ref 0.0–1.2)
Total Protein: 6.8 g/dL (ref 6.5–8.1)

## 2024-07-13 NOTE — Progress Notes (Signed)
 PCP - Dr. Almarie Cleveland Cardiologist - Dr. Dorn Lesches  PPM/ICD - denies   Chest x-ray - 02/14/24 EKG - 04/29/24 Stress Test - 08/24/15- CE ECHO - 03/03/24 Cardiac Cath - denies  Sleep Study - OSA+ CPAP - nightly, pressure settings 10-20  DM- denies  Last dose of GLP1 agonist-  n/a   Blood Thinner Instructions: Hold Xarelto  3 days prior to surgery. Last dose 7/17. Resume Xarelto  on 7/22. (Per Dr. Okey) Aspirin  Instructions: n/a  ERAS Protcol - no, NPO   COVID TEST- n/a   Anesthesia review: yes, cardiac hx (upcoming ablation 8/12)  Patient denies shortness of breath, fever, cough and chest pain at PAT appointment   All instructions explained to the patient, with a verbal understanding of the material. Patient agrees to go over the instructions while at home for a better understanding.  The opportunity to ask questions was provided.

## 2024-07-13 NOTE — Progress Notes (Signed)
 Spoke with Dena at Dr. Vallery office regarding Xarelto  and pt's upcoming surgery (now changed to 7/21). I called pt and told him that Dr. Soldatova said to stop Xarelto  3 days prior, last dose 7/17. He will resume Xarelto  7/22 (for upcoming ablation, which needs to be 3 weeks uninterrupted prior to ablation per cardiology). Pt was advised to send a message to Dr. Carolyn office to let them know about this plan and to get his CT Coronary test rescheduled (scheduled now for 7/21).

## 2024-07-13 NOTE — Telephone Encounter (Signed)
 New Message:       Powell says she needs to talk to Dr Carolyn nurse about this patient.Patient

## 2024-07-13 NOTE — Telephone Encounter (Signed)
 Pt is aware that his CT will be cancelled and he is ok to move fwd with his Inspire procedure. He knows that he can NOT miss a dose of Xarelto , if he does he is to contact our office ASAP.   Heather with the ENT office is aware also, I called and spoke to her.

## 2024-07-13 NOTE — Telephone Encounter (Signed)
-  Inspire device procedure has been moved [facility] 7/21 2:15 pm Blood thinner will be able to restart on 7/22.   Ablation scan also scheduled Mon 9:30 am - can this be rescheduled due to  was instructed to hydrate prior and will need to be NPO for the Inspire insertion.  Ablation scheduled 08/09/24 - this would give pt 3 whole weeks of Xarelto .  [phone] - can call Heather back with recommendations.

## 2024-07-13 NOTE — Telephone Encounter (Signed)
 This was taken care of by EP procedure scheduler

## 2024-07-14 NOTE — Progress Notes (Signed)
 Anesthesia Chart Review:  Case: [id number] Date/Time: 07/18/24 1415   Procedure: INSERTION, HYPOGLOSSAL NERVE STIMULATOR   Anesthesia type: General   Diagnosis: Obstructive sleep apnea (adult) (pediatric) [G47.33]   Pre-op diagnosis: Obstructive sleep apnea   [facility] ROOM 09 / [facility]   Surgeons: Okey Burns, MD       DISCUSSION: Patient is a 68 year old male scheduled for the above procedure. S/p DISE 05/31/24 and now for insertion of hypoglossal nerve stimulator for OSA on 07/18/24. He is also scheduled for repeat afib ablation on 08/09/24.    History includes never smoker (former smokeless tobacco), afib/aflutter (afib diagnosed ~ 2004, s/p multiple DCCV->s/p ablation 09/27/12 @ [facility] with multiple DCCV before & after, last 05/12/16 with several self-limited episodes until 02/2024 and required DCCV 03/03/24 & 04/29/24), OSA, HLD, elevated LFTs Garnet disease, elevated total bilirubin), gout, spinal surgery, right hearing aid, left tonsillitis with supraglottic edema (overnight admission 05/29/23).    Long time history of PAF. Last DCCV 04/29/24 with plans for ablation on 08/09/24. He in on Xarelto  and Tikosyn . Dr. Okey aware and reached out to cardiology. Preoperative cardiology input outlined by Wyn Jackee Raddle, NP on 05/19/24: Given past medical history and time since last visit, based on ACC/AHA guidelines, Roel Douthat is at acceptable risk for the planned procedure without further cardiovascular testing.    Per Dr. Inocencio he is at acceptable risk for intermediate risk procedure.  He will need to be on uninterrupted anticoagulation for 3 weeks prior to his upcoming ablation scheduled 08/09/2024 and uninterrupted 3 months following ablation procedure...   Mr. Yawn has been in communication with EP and ENT regarding Xarelto  instructions and timing of ablation (see multiple 07/13/24 RN notes). Last dose Xarelto  07/14/24 with plans to resume on 07/19/24. Afib ablation will be done on 08/09/24  with pre-procedure CT cardiac morphology scan to be scheduled after the Inspire device is implanted.   Anesthesia team to evaluate on the day of surgery.    VS: BP (!) 148/77   Pulse 74   Temp 36.6 C   Resp 19   Ht 6' 1 (1.854 m)   Wt 106.3 kg   SpO2 98%   BMI 30.93 kg/m    PROVIDERS: Rollene Almarie LABOR, MD is PCP  Inocencio Chi, MD is EP cardiologist. Also evaluted by Terra Pac, PA-C in the [facility]. Court Carrier, MD is primary cardiologist Abran Rush, MD is GI Shona Na, MD is urologist   LABS: Labs reviewed: Acceptable for surgery. Total bilirubin 1.8, previously 1.6 on 08/08/23. AST and ALT are normal--history of Gilbert's syndrome. (all labs ordered are listed, but only abnormal results are displayed)  Labs Reviewed  COMPREHENSIVE METABOLIC PANEL WITH GFR - Abnormal; Notable for the following components:      Result Value   Total Bilirubin 1.8 (*)    All other components within normal limits  CBC  A1c 6.0% on 03/03/24.    Home Sleep Study 09/13/23: IMPRESSIONS: - Moderate obstructive sleep apnea occurred during this study (AHI 29.3/h). There is a significant positional component with supine sleep AHI 41.2/h versus non-supine sleep AHI 8.7/h. - Mild oxygen desaturation was noted during this study to a nadir of 87%. - No snoring was audible during this study.     IMAGES: CT Chest (over read CT Cardiac Calcium  Scoring) 03/10/24: - Coronary atheromatous calcifications. - Mediastinum/Nodes: No suspicious adenopathy identified. Imaged mediastinal structures are unremarkable. - Lungs/Pleura: Calcified granuloma right middle lobe. Imaged lungs are otherwise  clear. No pleural effusion or pneumothorax. - Upper Abdomen: No acute abnormality. - Musculoskeletal: Minimal bilateral gynecomastia. No chest wall abnormality. No acute osseous findings. Thoracic degenerative changes. IMPRESSION: No acute extracardiac incidental findings.      EKG: 04/29/24: Sinus rhythm with Premature atrial complexes Left axis deviation Abnormal ECG When compared with ECG of 26-Apr-2024 14:50, PREVIOUS ECG IS PRESENT Since last tracing atrial flutter has replaced NSR Confirmed by Shlomo Corning ([id number]) on 04/30/2024 5:27:12 PM     CV: CT Cardiac Calcium  Scoring 03/10/24: IMPRESSION: 1. Coronary calcium  score of 78.1. This was 44th percentile for age-, race-, and sex-matched controls. RECOMMENDATIONS:... If CAC is 1 to 99, it is reasonable to initiate statin therapy for patients >=6 years of age...     TEE 03/03/24: IMPRESSIONS   1. Left ventricular ejection fraction, by estimation, is 50 to 55%. The  left ventricle has low normal function.   2. Right ventricular systolic function is normal. The right ventricular  size is mildly enlarged.   3. No atrial kick on mitral TDI. No left atrial/left atrial appendage  thrombus was detected. The LAA emptying velocity was 60 cm/s.   4. The mitral valve is normal in structure. Trivial mitral valve  regurgitation. No evidence of mitral stenosis.   5. The aortic valve is tricuspid. Aortic valve regurgitation is trivial.  No aortic stenosis is present.   6. The inferior vena cava is normal in size with greater than 50%  respiratory variability, suggesting right atrial pressure of 3 mmHg.   7. NA and demonstrates No 3D acquistioin.       Past Medical History:  Diagnosis Date   Atrial fib/flutter, transient (HCC)    patient takes tykosyn   Cutaneous skin tags 01/09/2022   Dysrhythmia    a-fib   Elevated LFTs    Gout    hands and feet   Hearing aid worn 2021   right   Hyperlipidemia    Persistent atrial fibrillation (HCC)    RLS (restless legs syndrome)    Sleep apnea    uses CPAP    Past Surgical History:  Procedure Laterality Date   ATRIAL FIBRILLATION ABLATION  2015   BONE EXCISION Right 05/09/2021   Procedure: RIGHT TIBIA EXCISION;  Surgeon: Cristy Bonner DASEN, MD;  Location: [facility];  Service: Orthopedics;  Laterality: Right;   CARDIOVERSION N/A 03/03/2024   Procedure: CARDIOVERSION;  Surgeon: Santo Stanly LABOR, MD;  Location: [facility] INVASIVE CV LAB;  Service: Cardiovascular;  Laterality: N/A;   CARDIOVERSION N/A 04/29/2024   Procedure: CARDIOVERSION;  Surgeon: Loni Soyla LABOR, MD;  [facility]: [facility];  Service: Cardiovascular;  Laterality: N/A;   COLONOSCOPY     DRUG INDUCED ENDOSCOPY N/A 05/31/2024   Procedure: DRUG INDUCED SLEEP ENDOSCOPY;  Surgeon: Okey Burns, MD;  Location: [facility];  Service: ENT;  Laterality: N/A;   ELBOW SURGERY Left    tendinitis   EXCISION MORTON'S NEUROMA Right 05/09/2021   Procedure: EXCISION OF RIGHT SECOND TOE MASS;  Surgeon: Elsa Lonni SAUNDERS, MD;  Location: [facility];  Service: Orthopedics;  Laterality: Right;   FOOT SURGERY Left    HERNIA REPAIR     KNEE ARTHROSCOPY W/ DEBRIDEMENT Right    LUMBAR DISC SURGERY  2012   L3-L4, No hardware   metatarsal fusion     right large toe   TRANSESOPHAGEAL ECHOCARDIOGRAM (CATH LAB) N/A 03/03/2024   Procedure: TRANSESOPHAGEAL ECHOCARDIOGRAM;  Surgeon: Santo Stanly LABOR, MD;  [facility];  Service: Cardiovascular;  Laterality: N/A;    MEDICATIONS:  allopurinol  (ZYLOPRIM ) 300 MG tablet   atorvastatin  (LIPITOR) 80 MG tablet   diltiazem  (CARDIZEM ) 30 MG tablet   dofetilide  (TIKOSYN ) 500 MCG capsule   escitalopram  (LEXAPRO ) 5 MG tablet   eszopiclone  (LUNESTA ) 2 MG TABS tablet   ezetimibe  (ZETIA ) 10 MG tablet   Multiple Vitamin (MULTIVITAMIN WITH MINERALS) TABS tablet   rivaroxaban  (XARELTO ) 20 MG TABS tablet   No current facility-administered medications for this encounter.    Isaiah Ruder, PA-C Surgical Short Stay/Anesthesiology [facility] Phone [phone] [facility] Phone [phone] 07/14/2024 12:44 PM

## 2024-07-14 NOTE — Anesthesia Preprocedure Evaluation (Addendum)
 Anesthesia Evaluation  Patient identified by MRN, date of birth, ID band Patient awake    Reviewed: Allergy & Precautions, NPO status , Patient's Chart, lab work & pertinent test results  History of Anesthesia Complications Negative for: history of anesthetic complications  Airway Mallampati: II  TM Distance: >3 FB Neck ROM: Full    Dental no notable dental hx. (+) Teeth Intact   Pulmonary sleep apnea and Continuous Positive Airway Pressure Ventilation , neg COPD, Patient abstained from smoking.Not current smoker   Pulmonary exam normal breath sounds clear to auscultation       Cardiovascular Exercise Tolerance: Good METS(-) hypertension+ CAD  (-) Past MI + dysrhythmias Atrial Fibrillation  Rhythm:Regular Rate:Normal - Systolic murmurs    Neuro/Psych  PSYCHIATRIC DISORDERS Anxiety     negative neurological ROS     GI/Hepatic ,neg GERD  ,,(+)     (-) substance abuse    Endo/Other  neg diabetes    Renal/GU negative Renal ROS     Musculoskeletal   Abdominal   Peds  Hematology   Anesthesia Other Findings  Patient is a 68 year old male scheduled for the above procedure. S/p DISE 05/31/24 and now for insertion of hypoglossal nerve stimulator for OSA on 07/18/24. He is also scheduled for repeat afib ablation on 08/09/24.    History includes never smoker (former smokeless tobacco), afib/aflutter (afib diagnosed ~ 2004, s/p multiple DCCV->s/p ablation 09/27/12 @ [facility] with multiple DCCV before & after, last 05/12/16 with several self-limited episodes until 02/2024 and required DCCV 03/03/24 & 04/29/24), OSA, HLD, elevated LFTs Garnet disease, elevated total bilirubin), gout, spinal surgery, right hearing aid, left tonsillitis with supraglottic edema (overnight admission 05/29/23).    Long time history of PAF. Last DCCV 04/29/24 with plans for ablation on 08/09/24. He in on Xarelto  and Tikosyn . Dr. Okey aware and reached out to  cardiology. Preoperative cardiology input outlined by Wyn Jackee Raddle, NP on 05/19/24: Given past medical history and time since last visit, based on ACC/AHA guidelines, Johnathan [name] is at acceptable risk for the planned procedure without further cardiovascular testing.    Per Dr. Inocencio he is at acceptable risk for intermediate risk procedure.  He will need to be on uninterrupted anticoagulation for 3 weeks prior to his upcoming ablation scheduled 08/09/2024 and uninterrupted 3 months following ablation procedure...    Mr. Brisco has been in communication with EP and ENT regarding Xarelto  instructions and timing of ablation (see multiple 07/13/24 RN notes). Last dose Xarelto  07/14/24 with plans to resume on 07/19/24. Afib ablation will be done on 08/09/24 with pre-procedure CT cardiac morphology scan to be scheduled after the Inspire device is implanted.      Reproductive/Obstetrics                              Anesthesia Physical Anesthesia Plan  ASA: 2  Anesthesia Plan: General   Post-op Pain Management: Tylenol  PO (pre-op)*   Induction: Intravenous  PONV Risk Score and Plan: 3 and Ondansetron , Dexamethasone  and Midazolam   Airway Management Planned: Oral ETT  Additional Equipment: None  Intra-op Plan:   Post-operative Plan: Extubation in OR  Informed Consent: I have reviewed the patients History and Physical, chart, labs and discussed the procedure including the risks, benefits and alternatives for the proposed anesthesia with the patient or authorized representative who has indicated his/her understanding and acceptance.     Dental advisory given  Plan Discussed with: CRNA and Surgeon  Anesthesia Plan Comments: (Discussed risks of anesthesia with patient, including PONV, sore throat, lip/dental/eye damage. Rare risks discussed as well, such as cardiorespiratory and neurological sequelae, and allergic reactions. Discussed the role of CRNA in patient's  perioperative care. Patient understands.)         Anesthesia Quick Evaluation

## 2024-07-18 ENCOUNTER — Ambulatory Visit (HOSPITAL_COMMUNITY)

## 2024-07-18 ENCOUNTER — Other Ambulatory Visit: Payer: Self-pay

## 2024-07-18 ENCOUNTER — Ambulatory Visit (HOSPITAL_COMMUNITY): Payer: Self-pay | Admitting: Physician Assistant

## 2024-07-18 ENCOUNTER — Encounter (HOSPITAL_COMMUNITY)
Admission: RE | Disposition: A | Discharge status: Home / Self Care | Payer: Self-pay | Source: Home / Self Care | Attending: Otolaryngology

## 2024-07-18 ENCOUNTER — Encounter (HOSPITAL_COMMUNITY): Payer: Self-pay | Admitting: Certified Registered Nurse Anesthetist

## 2024-07-18 ENCOUNTER — Ambulatory Visit (HOSPITAL_COMMUNITY)
Admission: RE | Admit: 2024-07-18 | Discharge: 2024-07-18 | Disposition: A | Discharge status: Home / Self Care | Attending: Otolaryngology | Admitting: Otolaryngology

## 2024-07-18 DIAGNOSIS — I4891 Unspecified atrial fibrillation: Secondary | ICD-10-CM | POA: Diagnosis not present

## 2024-07-18 DIAGNOSIS — F419 Anxiety disorder, unspecified: Secondary | ICD-10-CM | POA: Insufficient documentation

## 2024-07-18 DIAGNOSIS — G4733 Obstructive sleep apnea (adult) (pediatric): Secondary | ICD-10-CM

## 2024-07-18 DIAGNOSIS — Z87891 Personal history of nicotine dependence: Secondary | ICD-10-CM | POA: Insufficient documentation

## 2024-07-18 DIAGNOSIS — I2510 Atherosclerotic heart disease of native coronary artery without angina pectoris: Secondary | ICD-10-CM | POA: Insufficient documentation

## 2024-07-18 DIAGNOSIS — Z6829 Body mass index (BMI) 29.0-29.9, adult: Secondary | ICD-10-CM

## 2024-07-18 DIAGNOSIS — Z91198 Patient's noncompliance with other medical treatment and regimen for other reason: Secondary | ICD-10-CM | POA: Diagnosis not present

## 2024-07-18 HISTORY — PX: IMPLANTATION OF HYPOGLOSSAL NERVE STIMULATOR: SHX6827

## 2024-07-18 SURGERY — INSERTION, HYPOGLOSSAL NERVE STIMULATOR
Anesthesia: General

## 2024-07-18 MED ORDER — LIDOCAINE 2% (20 MG/ML) 5 ML SYRINGE
INTRAMUSCULAR | Status: AC
  Filled 2024-07-18: qty 5

## 2024-07-18 MED ORDER — GLYCOPYRROLATE PF 0.2 MG/ML IJ SOSY
PREFILLED_SYRINGE | INTRAMUSCULAR | Status: DC | PRN
  Administered 2024-07-18: .2 mg via INTRAVENOUS

## 2024-07-18 MED ORDER — LACTATED RINGERS IV SOLN: INTRAVENOUS | Status: DC

## 2024-07-18 MED ORDER — ACETAMINOPHEN 500 MG PO TABS: 500.0000 mg | ORAL_TABLET | Freq: Four times a day (QID) | ORAL | 0 refills | Status: DC

## 2024-07-18 MED ORDER — SODIUM CHLORIDE 0.9 % IV SOLN
1.0000 g | Freq: Once | INTRAVENOUS | Status: DC
  Filled 2024-07-18 (×2): qty 10

## 2024-07-18 MED ORDER — PHENYLEPHRINE HCL-NACL 20-0.9 MG/250ML-% IV SOLN
INTRAVENOUS | Status: DC | PRN
  Administered 2024-07-18: 15 ug/min via INTRAVENOUS

## 2024-07-18 MED ORDER — SUCCINYLCHOLINE CHLORIDE 200 MG/10ML IV SOSY
PREFILLED_SYRINGE | INTRAVENOUS | Status: DC | PRN
  Administered 2024-07-18: 120 mg via INTRAVENOUS

## 2024-07-18 MED ORDER — OXYCODONE HCL 5 MG PO TABS
5.0000 mg | ORAL_TABLET | Freq: Once | ORAL | Status: AC | PRN
  Administered 2024-07-18: 5 mg via ORAL

## 2024-07-18 MED ORDER — FENTANYL CITRATE (PF) 250 MCG/5ML IJ SOLN
INTRAMUSCULAR | Status: AC
  Filled 2024-07-18: qty 5

## 2024-07-18 MED ORDER — SODIUM CHLORIDE 0.9 % IV SOLN
3.0000 g | INTRAVENOUS | Status: DC
  Filled 2024-07-18: qty 8

## 2024-07-18 MED ORDER — LIDOCAINE 2% (20 MG/ML) 5 ML SYRINGE
INTRAMUSCULAR | Status: DC | PRN
  Administered 2024-07-18: 100 mg via INTRAVENOUS

## 2024-07-18 MED ORDER — MIDAZOLAM HCL 2 MG/2ML IJ SOLN
INTRAMUSCULAR | Status: DC | PRN
  Administered 2024-07-18: 2 mg via INTRAVENOUS

## 2024-07-18 MED ORDER — PROPOFOL 10 MG/ML IV BOLUS
INTRAVENOUS | Status: DC | PRN
  Administered 2024-07-18: 50 ug/kg/min via INTRAVENOUS
  Administered 2024-07-18: 200 mg via INTRAVENOUS

## 2024-07-18 MED ORDER — ORAL CARE MOUTH RINSE: 15.0000 mL | Freq: Once | OROMUCOSAL | Status: AC

## 2024-07-18 MED ORDER — ONDANSETRON HCL 4 MG/2ML IJ SOLN
INTRAMUSCULAR | Status: AC
  Filled 2024-07-18: qty 2

## 2024-07-18 MED ORDER — EPHEDRINE SULFATE-NACL 50-0.9 MG/10ML-% IV SOSY
PREFILLED_SYRINGE | INTRAVENOUS | Status: DC | PRN
  Administered 2024-07-18 (×2): 5 mg via INTRAVENOUS
  Administered 2024-07-18: 10 mg via INTRAVENOUS

## 2024-07-18 MED ORDER — IBUPROFEN 600 MG PO TABS: 600.0000 mg | ORAL_TABLET | Freq: Four times a day (QID) | ORAL | 0 refills | Status: DC

## 2024-07-18 MED ORDER — FENTANYL CITRATE (PF) 100 MCG/2ML IJ SOLN: 25.0000 ug | INTRAMUSCULAR | Status: DC | PRN

## 2024-07-18 MED ORDER — LIDOCAINE-EPINEPHRINE 1 %-1:100000 IJ SOLN
INTRAMUSCULAR | Status: DC | PRN
  Administered 2024-07-18: 8 mL

## 2024-07-18 MED ORDER — MIDAZOLAM HCL 2 MG/2ML IJ SOLN
INTRAMUSCULAR | Status: AC
Start: 2024-07-18 — End: 2024-07-18
  Filled 2024-07-18: qty 2

## 2024-07-18 MED ORDER — AMOXICILLIN-POT CLAVULANATE 875-125 MG PO TABS: 1.0000 | ORAL_TABLET | Freq: Two times a day (BID) | ORAL | 0 refills | Status: DC

## 2024-07-18 MED ORDER — 0.9 % SODIUM CHLORIDE (POUR BTL) OPTIME
TOPICAL | Status: DC | PRN
  Administered 2024-07-18: 1000 mL

## 2024-07-18 MED ORDER — ONDANSETRON HCL 4 MG/2ML IJ SOLN: 4.0000 mg | Freq: Once | INTRAMUSCULAR | Status: DC | PRN

## 2024-07-18 MED ORDER — LIDOCAINE-EPINEPHRINE 1 %-1:100000 IJ SOLN
INTRAMUSCULAR | Status: AC
  Filled 2024-07-18: qty 1

## 2024-07-18 MED ORDER — OXYCODONE HCL 5 MG PO TABS: 5.0000 mg | ORAL_TABLET | Freq: Four times a day (QID) | ORAL | 0 refills | Status: DC | PRN

## 2024-07-18 MED ORDER — OXYCODONE HCL 5 MG PO TABS
ORAL_TABLET | ORAL | Status: AC
  Filled 2024-07-18: qty 1

## 2024-07-18 MED ORDER — CHLORHEXIDINE GLUCONATE 0.12 % MT SOLN
15.0000 mL | Freq: Once | OROMUCOSAL | Status: AC
  Administered 2024-07-18: 15 mL via OROMUCOSAL
  Filled 2024-07-18: qty 15

## 2024-07-18 MED ORDER — ONDANSETRON HCL 4 MG/2ML IJ SOLN
INTRAMUSCULAR | Status: DC | PRN
  Administered 2024-07-18: 4 mg via INTRAVENOUS

## 2024-07-18 MED ORDER — DEXAMETHASONE SODIUM PHOSPHATE 10 MG/ML IJ SOLN
INTRAMUSCULAR | Status: DC | PRN
  Administered 2024-07-18: 10 mg via INTRAVENOUS

## 2024-07-18 MED ORDER — FENTANYL CITRATE (PF) 250 MCG/5ML IJ SOLN
INTRAMUSCULAR | Status: DC | PRN
  Administered 2024-07-18: 100 ug via INTRAVENOUS

## 2024-07-18 MED ORDER — DEXAMETHASONE SODIUM PHOSPHATE 10 MG/ML IJ SOLN
INTRAMUSCULAR | Status: AC
  Filled 2024-07-18: qty 1

## 2024-07-18 MED ORDER — PROPOFOL 10 MG/ML IV BOLUS
INTRAVENOUS | Status: AC
  Filled 2024-07-18: qty 20

## 2024-07-18 MED ORDER — SODIUM CHLORIDE 0.9 % IV SOLN
3.0000 g | INTRAVENOUS | Status: AC
  Administered 2024-07-18: 3 g via INTRAVENOUS
  Filled 2024-07-18: qty 8

## 2024-07-18 MED ORDER — ACETAMINOPHEN 500 MG PO TABS
1000.0000 mg | ORAL_TABLET | Freq: Once | ORAL | Status: AC
  Administered 2024-07-18: 1000 mg via ORAL
  Filled 2024-07-18: qty 2

## 2024-07-18 MED ORDER — OXYCODONE HCL 5 MG/5ML PO SOLN: 5.0000 mg | Freq: Once | ORAL | Status: AC | PRN

## 2024-07-18 SURGICAL SUPPLY — 60 items
BAG COUNTER SPONGE SURGICOUNT (BAG) ×1 IMPLANT
BAG DECANTER FOR FLEXI CONT (MISCELLANEOUS) ×1 IMPLANT
BLADE CLIPPER SURG (BLADE) IMPLANT
BLADE SURG 15 STRL LF DISP TIS (BLADE) ×3 IMPLANT
CANISTER SUCTION 3000ML PPV (SUCTIONS) ×1 IMPLANT
CORD BIPOLAR FORCEPS 12FT (ELECTRODE) ×1 IMPLANT
COVER PROBE W GEL 5X96 (DRAPES) ×1 IMPLANT
COVER SURGICAL LIGHT HANDLE (MISCELLANEOUS) ×1 IMPLANT
DERMABOND ADVANCED .7 DNX12 (GAUZE/BANDAGES/DRESSINGS) ×2 IMPLANT
DRAPE C-ARM 35X43 STRL (DRAPES) ×1 IMPLANT
DRAPE INCISE IOBAN 66X45 STRL (DRAPES) ×1 IMPLANT
DRAPE MICROSCOPE LEICA 54X105 (DRAPES) ×1 IMPLANT
DRSG TEGADERM 4X4.75 (GAUZE/BANDAGES/DRESSINGS) ×3 IMPLANT
ELECT COATED BLADE 2.86 ST (ELECTRODE) ×1 IMPLANT
ELECTRODE 4 CHANNEL SET (MISCELLANEOUS) IMPLANT
ELECTRODE EMG 18 NIMS (NEUROSURGERY SUPPLIES) ×1 IMPLANT
ELECTRODE REM PT RTRN 9FT ADLT (ELECTROSURGICAL) ×1 IMPLANT
GAUZE 4X4 16PLY ~~LOC~~+RFID DBL (SPONGE) ×1 IMPLANT
GAUZE SPONGE 4X4 12PLY STRL (GAUZE/BANDAGES/DRESSINGS) ×1 IMPLANT
GENERATOR PULSE INSPIRE (Generator) ×1 IMPLANT
GENERATOR PULSE INSPIRE IV (Generator) ×1 IMPLANT
GLOVE BIO SURGEON STRL SZ 6 (GLOVE) ×1 IMPLANT
GLOVE BIO SURGEON STRL SZ7.5 (GLOVE) ×1 IMPLANT
GOWN STRL REUS W/ TWL LRG LVL3 (GOWN DISPOSABLE) ×3 IMPLANT
KIT BASIN OR (CUSTOM PROCEDURE TRAY) ×1 IMPLANT
KIT NEURO ACCESSORY W/WRENCH (MISCELLANEOUS) IMPLANT
KIT TURNOVER KIT B (KITS) ×1 IMPLANT
LEAD SENSING RESP INSPIRE (Lead) ×1 IMPLANT
LEAD SENSING RESP INSPIRE IV (Lead) ×1 IMPLANT
LEAD SLEEP STIM INSPIRE IV/V (Lead) ×1 IMPLANT
LEAD SLEEP STIMULATION INSPIRE (Lead) ×1 IMPLANT
LOOP VASCLR MAXI BLUE 18IN ST (MISCELLANEOUS) ×1 IMPLANT
LOOPS VASCLR MAXI BLUE 18IN ST (MISCELLANEOUS) ×1 IMPLANT
MARKER SKIN DUAL TIP RULER LAB (MISCELLANEOUS) ×2 IMPLANT
NDL HYPO 25GX1X1/2 BEV (NEEDLE) ×1 IMPLANT
NEEDLE HYPO 25GX1X1/2 BEV (NEEDLE) ×1 IMPLANT
NS IRRIG 1000ML POUR BTL (IV SOLUTION) ×1 IMPLANT
PAD ARMBOARD POSITIONER FOAM (MISCELLANEOUS) ×1 IMPLANT
PASSER CATH 38CM DISP (INSTRUMENTS) ×1 IMPLANT
PENCIL BUTTON HOLSTER BLD 10FT (ELECTRODE) ×1 IMPLANT
POSITIONER HEAD DONUT 9IN (MISCELLANEOUS) ×1 IMPLANT
PROBE NERVE STIMULATOR (NEUROSURGERY SUPPLIES) ×1 IMPLANT
REMOTE CONTROL SLEEP INSPIRE (MISCELLANEOUS) ×1 IMPLANT
RETRACTOR STAY HOOK 5MM (MISCELLANEOUS) ×1 IMPLANT
SET WALTER ACTIVATION W/DRAPE (SET/KITS/TRAYS/PACK) IMPLANT
SHEARS HARMONIC 9CM CVD (BLADE) ×1 IMPLANT
SPONGE INTESTINAL PEANUT (DISPOSABLE) ×3 IMPLANT
SUT MNCRL AB 4-0 PS2 18 (SUTURE) ×2 IMPLANT
SUT SILK 2 0 SH CR/8 (SUTURE) IMPLANT
SUT SILK 2 0 TIES 10X30 (SUTURE) IMPLANT
SUT SILK 3 0 TIES 10X30 (SUTURE) IMPLANT
SUT SILK 3 0SH CR/8 30 (SUTURE) IMPLANT
SUT VIC AB 3-0 SH 27X BRD (SUTURE) ×2 IMPLANT
SUTURE SILK 3-0 RB1 30XBRD (SUTURE) ×2 IMPLANT
SUTURE SILK PEM 2-0 1X30 RB-1 (SUTURE) ×2 IMPLANT
SYR 10ML LL (SYRINGE) ×1 IMPLANT
TAPE CLOTH SURG 4X10 WHT LF (GAUZE/BANDAGES/DRESSINGS) ×1 IMPLANT
TOWEL GREEN STERILE (TOWEL DISPOSABLE) ×1 IMPLANT
TRAY ENT MC OR (CUSTOM PROCEDURE TRAY) ×1 IMPLANT
VASCULAR TIE MINI RED 18IN STL (MISCELLANEOUS) ×1 IMPLANT

## 2024-07-18 NOTE — Anesthesia Procedure Notes (Signed)
 Procedure Name: Intubation Date/Time: 07/18/2024 12:37 PM  Performed by: Mathew Bernardino RAMAN, RNPre-anesthesia Checklist: Patient identified, Patient being monitored, Timeout performed, Emergency Drugs available and Suction available Patient Re-evaluated:Patient Re-evaluated prior to induction Oxygen Delivery Method: Circle system utilized Preoxygenation: Pre-oxygenation with 100% oxygen Induction Type: IV induction Ventilation: Mask ventilation without difficulty Laryngoscope Size: Mac and 4 Grade View: Grade I Tube type: Oral Tube size: 7.5 mm Number of attempts: 1 Airway Equipment and Method: Stylet Placement Confirmation: ETT inserted through vocal cords under direct vision, positive ETCO2 and breath sounds checked- equal and bilateral Secured at: 24 cm Tube secured with: Tape Dental Injury: Teeth and Oropharynx as per pre-operative assessment

## 2024-07-18 NOTE — Anesthesia Postprocedure Evaluation (Signed)
 Anesthesia Post Note  Patient: Johnathan [name]  Procedure(s) Performed: INSERTION, HYPOGLOSSAL NERVE STIMULATOR     Patient location during evaluation: PACU Anesthesia Type: General Level of consciousness: awake and alert Pain management: pain level controlled Vital Signs Assessment: post-procedure vital signs reviewed and stable Respiratory status: spontaneous breathing, nonlabored ventilation, respiratory function stable and patient connected to nasal cannula oxygen Cardiovascular status: blood pressure returned to baseline and stable Postop Assessment: no apparent nausea or vomiting Anesthetic complications: no   No notable events documented.  Last Vitals:  Vitals:   07/18/24 1430 07/18/24 1445  BP: (!) 126/55 114/73  Pulse: 64 64  Resp: 20 16  Temp: 36.5 C   SpO2: 98% 94%    Last Pain:  Vitals:   07/18/24 1430  TempSrc:   PainSc: 5                  Rome Ade

## 2024-07-18 NOTE — Discharge Instructions (Signed)
POST-OPERATIVE INSTRUCTIONS:   Please restart all of your home medications if you take anything on a daily basis.  You can resume regular diet after this procedure.   DIET: Resume normal diet HYGIENE: Please wait until 48 hours after surgery before getting incisions on neck, chest, and torso wet. In the first 48 hours after surgery, will likely need to take sponge baths. WOUND CARE: Please leave pressure dressing on for 48 hours after surgery. Gently place antibiotic ointment over incisions 2 times per day; use clean q-tip. May place a clean bandage over incisions as needed. After 48 hours, you may get incisions wet with warm soap and water, but do not soak the incisions.  Pat area dry gently.  Immediately place antibiotic ointment. Take oral antibiotics as prescribed If skin around incision starts to get red (> 1cm), swollen, and/or more painful, please call the office ACTIVITY: Try to avoid sleeping on the side of your surgery, to the extent possible.   You may walk for exercise starting the day after surgery. For 2 weeks: Do not pick up anything greater than 5 pounds with the hand/arm that's on the same side as the surgery.  After 2 weeks, you may increase weight to 10 pounds.   Consider performing neck rolls 10 clockwise and 10 counterclockwise 3x/day. For 4 weeks, no strenuous activity (running, jogging, lifting weights, gardening, sports) or until cleared by physician.   PAIN MEDICATIONS: You will be prescribed Oxycodone for pain.   Take Tylenol and Motrin extra strength every 6 hrs staggered 3 hrs apart from each other Avoid aspirin for 7 days after surgery Avoid direct heat (such as heating pads) to incision sites.   May gently place ice over surgery sites as needed.  Please place a thin clean towel over skin first and then place ice bag over towel.  Ice for 10 minutes at a time only.  POST-OPERATIVE CLINIC APPOINTMENTS: 1 week: suture removal and wound check in the office.  1  month: device activation and wound check in the office. 2.5 months: check in visit to assess usage. 3-4 months: titration sleep study based on usage of >4 hrs/night.  4 months: final wound check in the office.  Yearly: device check at office.  SCAR CARE: After incisions have healed, you will have a scar, which will continue to evolve over the course of 12 months.  Caring for your incision scars will help them to be as minimal as possible. If you are out in the sun with incision exposure, please remember to place sunscreen over the incision and surrounding skin.   You may use vitamin E or "Scar ointment/cream" to help soften scar.  Please wait one month after surgery before starting this.

## 2024-07-18 NOTE — H&P (Signed)
 Johnathan [name] is an 68 y.o. male.    Chief Complaint:  OSA   HPI: Patient presents today for planned elective procedure.  He/she denies any interval change in history since office visit on 05/18/24. Here for [facility] Implant surgery. No new illnesses or admissions.   Past Medical History:  Diagnosis Date   Atrial fib/flutter, transient (HCC)    patient takes tykosyn   Cutaneous skin tags 01/09/2022   Dysrhythmia    a-fib   Elevated LFTs    Gout    hands and feet   Hearing aid worn 2021   right   Hyperlipidemia    Persistent atrial fibrillation (HCC)    RLS (restless legs syndrome)    Sleep apnea    uses CPAP    Past Surgical History:  Procedure Laterality Date   ATRIAL FIBRILLATION ABLATION  2015   BONE EXCISION Right 05/09/2021   Procedure: RIGHT TIBIA EXCISION;  Surgeon: Cristy Bonner DASEN, MD;  Location: [facility];  Service: Orthopedics;  Laterality: Right;   CARDIOVERSION N/A 03/03/2024   Procedure: CARDIOVERSION;  Surgeon: Santo Stanly LABOR, MD;  Location: [facility];  Service: Cardiovascular;  Laterality: N/A;   CARDIOVERSION N/A 04/29/2024   Procedure: CARDIOVERSION;  Surgeon: Loni Soyla LABOR, MD;  Location: [facility];  Service: Cardiovascular;  Laterality: N/A;   COLONOSCOPY     DRUG INDUCED ENDOSCOPY N/A 05/31/2024   Procedure: DRUG INDUCED SLEEP ENDOSCOPY;  Surgeon: Okey Burns, MD;  Location: [facility] OR;  Service: ENT;  Laterality: N/A;   ELBOW SURGERY Left    tendinitis   EXCISION MORTON'S NEUROMA Right 05/09/2021   Procedure: EXCISION OF RIGHT SECOND TOE MASS;  Surgeon: Elsa Lonni SAUNDERS, MD;  Location: [facility];  Service: Orthopedics;  Laterality: Right;   FOOT SURGERY Left    HERNIA REPAIR     KNEE ARTHROSCOPY W/ DEBRIDEMENT Right    LUMBAR DISC SURGERY  2012   L3-L4, No hardware   metatarsal fusion     right large toe   TRANSESOPHAGEAL ECHOCARDIOGRAM (CATH LAB) N/A 03/03/2024   Procedure:  TRANSESOPHAGEAL ECHOCARDIOGRAM;  Surgeon: Santo Stanly LABOR, MD;  Location: [facility];  Service: Cardiovascular;  Laterality: N/A;    Family History  Problem Relation Age of Onset   Diabetes Mother    Diabetes Brother    Early death Brother    Heart disease Brother    Colon cancer Neg Hx    Esophageal cancer Neg Hx    Rectal cancer Neg Hx    Stomach cancer Neg Hx    Liver disease Neg Hx    Pancreatic cancer Neg Hx    Colon polyps Neg Hx     Social History:  reports that he has never smoked. He quit smokeless tobacco use about 25 years ago.  His smokeless tobacco use included chew. He reports current alcohol use of about 1.0 standard drink of alcohol per week. He reports that he does not use drugs.  Allergies:  Allergies  Allergen Reactions   Sulfa Antibiotics     Unknown reaction in childhood    Medications Prior to Admission  Medication Sig Dispense Refill   allopurinol  (ZYLOPRIM ) 300 MG tablet Take 1 tablet (300 mg total) by mouth daily. (Patient taking differently: Take 150 mg by mouth 2 (two) times daily.) 90 tablet 3   atorvastatin  (LIPITOR) 80 MG tablet Take 1 tablet (80 mg total) by mouth daily. 90 tablet 3   dofetilide  (  TIKOSYN ) 500 MCG capsule Take 1 capsule (500 mcg total) by mouth 2 (two) times daily. 180 capsule 3   escitalopram  (LEXAPRO ) 5 MG tablet Take 1 tablet (5 mg total) by mouth daily. 90 tablet 3   eszopiclone  (LUNESTA ) 2 MG TABS tablet Take 1 tablet (2 mg total) by mouth at bedtime as needed for sleep. 90 tablet 1   ezetimibe  (ZETIA ) 10 MG tablet Take 1 tablet (10 mg total) by mouth daily. 90 tablet 3   Multiple Vitamin (MULTIVITAMIN WITH MINERALS) TABS tablet Take 1 tablet by mouth in the morning.     rivaroxaban  (XARELTO ) 20 MG TABS tablet Take 1 tablet (20 mg total) by mouth daily with supper. 30 tablet 11   diltiazem  (CARDIZEM ) 30 MG tablet Take 1 Tablet Every 4 Hours As Needed For HR >100 and Top BP >100 (Patient not taking: Reported on  04/28/2024) 45 tablet 1    No results found for this or any previous visit (from the past 48 hours). No results found.  ROS:  ROS: Constitutional: Negative for fever, weight loss and weight gain. Cardiovascular: Negative for chest pain and dyspnea on exertion. Respiratory: Is not experiencing shortness of breath at rest. Gastrointestinal: Negative for nausea and vomiting. Neurological: Negative for headaches. Psychiatric: The patient is not nervous/anxious   Blood pressure 132/81, pulse 73, temperature 98.3 F (36.8 C), temperature source Oral, resp. rate 17, height 6' 1 (1.854 m), weight 102.1 kg, SpO2 99%.  PHYSICAL EXAM: Exam: General: Well-developed, well-nourished Respiratory Respiratory effort: Equal inspiration and expiration without stridor Cardiovascular Peripheral Vascular: Warm extremities with equal color/perfusion Eyes: No nystagmus with equal extraocular motion bilaterally Neuro/Psych/Balance: Patient oriented to person, place, and time; Appropriate mood and affect; Gait is intact with no imbalance; Cranial nerves I-XII are intact Head and Face Inspection: Normocephalic and atraumatic without mass or lesion Palpation: Facial skeleton intact without bony stepoffs Salivary Glands: No mass or tenderness  Facial Strength: Facial motility symmetric and full bilaterally ENT Pinna: External ear intact and fully developed External canal: Canal is patent with intact skin Tympanic Membrane: Clear and mobile External Nose: No scar or anatomic deformity Internal Nose: Septum is relatively straight on anterior rhinoscopy. Bilateral inferior turbinate hypertrophy.  Lips, Teeth, and gums: Mucosa and teeth intact and viable Oral cavity/oropharynx: No erythema or exudate, no lesions present Neck Neck and Trachea: Midline trachea without mass or lesion Thyroid : No mass or nodularity Lymphatics: No lymphadenopathy     Assessment/Plan OSA CPAP intolerance  - risks and  benefits of surgery discussed and he would like to proceed. Will proceed with Inspire implant surgery    Arrington Bencomo 07/18/2024, 11:32 AM

## 2024-07-18 NOTE — Transfer of Care (Signed)
 Immediate Anesthesia Transfer of Care Note  Patient: Johnathan [name]  Procedure(s) Performed: INSERTION, HYPOGLOSSAL NERVE STIMULATOR  Patient Location: PACU  Anesthesia Type:General  Level of Consciousness: awake, alert , oriented, and patient cooperative  Airway & Oxygen Therapy: Patient Spontanous Breathing and Patient connected to face mask oxygen  Post-op Assessment: Report given to RN, Post -op Vital signs reviewed and stable, and Patient moving all extremities  Post vital signs: Reviewed and stable  Last Vitals:  Vitals Value Taken Time  BP 126/55 07/18/24 14:30  Temp 36.5 C 07/18/24 14:30  Pulse 62 07/18/24 14:34  Resp 16 07/18/24 14:34  SpO2 96 % 07/18/24 14:34  Vitals shown include unfiled device data.  Last Pain:  Vitals:   07/18/24 1430  TempSrc:   PainSc: 5          Complications: No notable events documented.

## 2024-07-18 NOTE — Op Note (Signed)
 Operative Report                                                            SURGEON: Elena Larry, MD  ASSISTANT: Powell Seats, RNFA  PREOPERATIVE DIAGNOSIS: Obstructive sleep apnea. BMI of 29.9  POSTOPERATIVE DIAGNOSIS: Obstructive sleep apnea. BMI of 29.9  ANESTHESIA: General endotracheal.  ESTIMATED BLOOD LOSS: Less than 15 ml.  SPECIMENS: None.  DRAINS: None.  COMPLICATIONS: None.  Procedure: 12th cranial nerve (hypoglossal) stimulation implant along with placement of right pleural respiration sensor.  Electronic analysis of the implanted neurostimulator pulse generator system  Pre-Op Diagnosis: Moderate /Severe obstructive sleep apnea with positive airway pressure intolerance (ICD-10 G47.33). Post-Op Diagnosis: Moderate /Severe obstructive sleep apnea with positive pressure airway intolerance (ICD-10 G47.33).   Anesthesia: General endotracheal  Complications: None Brief Clinical History:     This is a 68 year old patient with a history of moderate /severe obstructive sleep apnea, who is intolerant and unable to achieve benefit from positive pressure therapy. Patient has passed the clinical, polysomnographic, and endoscopic screening criteria and presents today for the implant.    Procedure Description: The patient was brought to the Operating Room and was anesthetized via general endotracheal anesthesia without complication.  A shoulder roll was placed and the patient was prepped and draped in usual sterile fashion with the head turned to the left. Prior to prepping and draping, electrodes were placed in the genioglossus and styloglossus muscle and connected to the NIM box for intraoperative nerve monitoring.  A submental incision was made in the right upper neck approximately 2 cm below the mandible in the natural skin crease. Dissection was carried down through the subcutaneous tissue and platysma. The inferior border of the submandibular gland was identified as  well as the digastric tendon. The submandibular gland and the overlying fascia with the marginal mandibular nerve were retracted superiorly.  The digastric was retracted inferiorly.  Dissection was carried down into the digastric triangle where the hypoglossal nerve was identified in its usual fashion.   The mylohyoid muscle was retracted anteriorally, and the hypoglossal nerve was dissected up towards the floor of the mouth.  The lateral branches to retrusor muscles were identified, and tested intra-operatively using the NIM stimulator.  The cuff electrode for the hypoglossal nerve stimulator was placed distally to these branches on the medial nerve branch to the genioglossus muscle. Diagnostic evaluation confirmed activation of the genioglossus nerve, resulting in genioglossal activation and tongue protrusion, confirmed both visually and on NIM monitor. The stimulation electrode was then secured to the digastric tendon on its lateral surface with the provided anchor.  A second 5 cm incision was made in the right upper chest approximately 3 cm below the clavicle.  Dissection was carried down to the pectoralis muscle. An inferior pocket was created deep to the subcutaneous layer and superficial to the pectoralis major muscle.  In the upper portion of our chest pocket, pectoralis muscle fibers were then divided until we encountered anterior chest wall. External intercostal muscle was visualized and dissected until internal intercostal was visualized. Our respiratory sense lead was then tunneled in the fascial plane between external and internal intercostals. The distal anchor was secured to the anterior chest wall using 3-2.0 silk sutures and the proximal anchor was secured to the pec major facscia.  The stimulation lead was then tunneled in a subplatysmal plane and brought out into the sub-clavicular pocket.  Pulse generator was then brought onto the field and the sense and stimulation pins were both connected  to the appropriate headers and secured using a torque screwdriver. The implantable pulse generator was placed in the subclavicular pocket and secured loosely to the pectoralis fascia using non-resorbable sutures.    Diagnostic evaluation of the system was run, confirming good respiratory wave from the sensing lead, good anterior tongue protrusion with stimulation, and normal impedence measurements.  All the wounds were thoroughly irrigated with bacitracin irrigation.  The wounds were then closed in multiple layers with deep Vicryl sutures and a 5-0 biosyn.  Dermabond was then applied to the skin.    The patient was then awakened, extubated, and transferred to Recovery Room in stable condition. All counts correct x2.  I was present for and performed the entire procedure.  The RNFA assisted throughout the case. The RNFA was essential in retraction and counter traction when needed to make the case progress smoothly. This retraction and assistance made it possible to see the tissue plans for the procedure. The assistance was needed for blood control, tissue re-approximation and assisted with closure of the incision site

## 2024-07-19 ENCOUNTER — Telehealth (INDEPENDENT_AMBULATORY_CARE_PROVIDER_SITE_OTHER): Payer: Self-pay

## 2024-07-19 ENCOUNTER — Encounter (HOSPITAL_COMMUNITY): Payer: Self-pay | Admitting: Otolaryngology

## 2024-07-19 NOTE — Telephone Encounter (Signed)
 Spoke to patient about question earlier. Per Dr. Soldatova- patient does not need to apply anything and patient can take of bandages today.

## 2024-07-19 NOTE — Telephone Encounter (Signed)
 Patient called concerned about his wound dressing. Patient wants to clarify if he should leave his bandages on for 48 hours and then apply ointment to wound or if he should go ahead and apply it over the bandages. Based on the instructions that he was given that he read to me I told him to wait 48 hours and then start applying ointment with the bandages off. I told him that I would ask to make sure. Please advise.

## 2024-07-19 NOTE — Telephone Encounter (Signed)
 Gave patient a call back about a question. I let the patient know that Per Dr. Okey, the symptoms that he is having are normal and that he should give us  a call back if things get worse.

## 2024-07-20 ENCOUNTER — Encounter (HOSPITAL_COMMUNITY): Payer: Self-pay

## 2024-07-20 ENCOUNTER — Emergency Department (HOSPITAL_COMMUNITY)
Admission: EM | Admit: 2024-07-20 | Discharge: 2024-07-20 | Disposition: A | Discharge status: Home / Self Care | Attending: Emergency Medicine | Admitting: Emergency Medicine

## 2024-07-20 ENCOUNTER — Emergency Department (HOSPITAL_COMMUNITY)

## 2024-07-20 ENCOUNTER — Other Ambulatory Visit: Payer: Self-pay

## 2024-07-20 DIAGNOSIS — R221 Localized swelling, mass and lump, neck: Secondary | ICD-10-CM | POA: Diagnosis present

## 2024-07-20 DIAGNOSIS — R609 Edema, unspecified: Secondary | ICD-10-CM

## 2024-07-20 DIAGNOSIS — E871 Hypo-osmolality and hyponatremia: Secondary | ICD-10-CM | POA: Insufficient documentation

## 2024-07-20 DIAGNOSIS — R222 Localized swelling, mass and lump, trunk: Secondary | ICD-10-CM | POA: Insufficient documentation

## 2024-07-20 LAB — CBC WITH DIFFERENTIAL/PLATELET
Abs Immature Granulocytes: 0.04 K/uL (ref 0.00–0.07)
Basophils Absolute: 0 K/uL (ref 0.0–0.1)
Basophils Relative: 0 %
Eosinophils Absolute: 0.1 K/uL (ref 0.0–0.5)
Eosinophils Relative: 1 %
HCT: 42.4 % (ref 39.0–52.0)
Hemoglobin: 14.5 g/dL (ref 13.0–17.0)
Immature Granulocytes: 0 %
Lymphocytes Relative: 13 %
Lymphs Abs: 1.8 K/uL (ref 0.7–4.0)
MCH: 30.7 pg (ref 26.0–34.0)
MCHC: 34.2 g/dL (ref 30.0–36.0)
MCV: 89.6 fL (ref 80.0–100.0)
Monocytes Absolute: 1 K/uL (ref 0.1–1.0)
Monocytes Relative: 7 %
Neutro Abs: 11.7 K/uL — ABNORMAL HIGH (ref 1.7–7.7)
Neutrophils Relative %: 79 %
Platelets: 248 K/uL (ref 150–400)
RBC: 4.73 MIL/uL (ref 4.22–5.81)
RDW: 13.1 % (ref 11.5–15.5)
WBC: 14.8 K/uL — ABNORMAL HIGH (ref 4.0–10.5)
nRBC: 0 % (ref 0.0–0.2)

## 2024-07-20 LAB — PROTIME-INR
INR: 1.9 — ABNORMAL HIGH (ref 0.8–1.2)
Prothrombin Time: 23.1 s — ABNORMAL HIGH (ref 11.4–15.2)

## 2024-07-20 LAB — COMPREHENSIVE METABOLIC PANEL WITH GFR
ALT: 26 U/L (ref 0–44)
AST: 36 U/L (ref 15–41)
Albumin: 3.9 g/dL (ref 3.5–5.0)
Alkaline Phosphatase: 88 U/L (ref 38–126)
Anion gap: 12 (ref 5–15)
BUN: 17 mg/dL (ref 8–23)
CO2: 23 mmol/L (ref 22–32)
Calcium: 8.9 mg/dL (ref 8.9–10.3)
Chloride: 92 mmol/L — ABNORMAL LOW (ref 98–111)
Creatinine, Ser: 0.88 mg/dL (ref 0.61–1.24)
GFR, Estimated: >60 mL/min (ref >60)
Glucose, Bld: 111 mg/dL — ABNORMAL HIGH (ref 70–99)
Potassium: 4.3 mmol/L (ref 3.5–5.1)
Sodium: 127 mmol/L — ABNORMAL LOW (ref 135–145)
Total Bilirubin: 1.6 mg/dL — ABNORMAL HIGH (ref 0.0–1.2)
Total Protein: 6.6 g/dL (ref 6.5–8.1)

## 2024-07-20 LAB — ABO/RH: ABO/RH(D): A NEG

## 2024-07-20 LAB — TYPE AND SCREEN
ABO/RH(D): A NEG
Antibody Screen: NEGATIVE

## 2024-07-20 MED ORDER — SODIUM CHLORIDE 0.9 % IV BOLUS
500.0000 mL | Freq: Once | INTRAVENOUS | Status: AC
  Administered 2024-07-20: 500 mL via INTRAVENOUS

## 2024-07-20 MED ORDER — CLINDAMYCIN PHOSPHATE 600 MG/50ML IV SOLN: 600.0000 mg | Freq: Once | INTRAVENOUS | Status: DC

## 2024-07-20 MED ORDER — DEXAMETHASONE SODIUM PHOSPHATE 10 MG/ML IJ SOLN
10.0000 mg | Freq: Once | INTRAMUSCULAR | Status: DC
  Filled 2024-07-20: qty 1

## 2024-07-20 MED ORDER — IOHEXOL 350 MG/ML SOLN
75.0000 mL | Freq: Once | INTRAVENOUS | Status: AC | PRN
  Administered 2024-07-20: 75 mL via INTRAVENOUS

## 2024-07-20 MED ORDER — METHYLPREDNISOLONE 4 MG PO TBPK: ORAL_TABLET | ORAL | 0 refills | Status: DC

## 2024-07-20 NOTE — ED Triage Notes (Signed)
 Pt had an inspire implant surgery yesterday. Today when he removed his bandage his throat is swollen up. Pt says it seems to be getting worse and making it hard to breathe.  Pt denies drooling.

## 2024-07-20 NOTE — ED Notes (Signed)
 Patient transported to CT

## 2024-07-20 NOTE — ED Provider Notes (Signed)
 [place] EMERGENCY DEPARTMENT AT [facility] Provider Note   CSN: [id number] Arrival date & time: 07/20/24  0124     Patient presents with: Neck Swelling and Post-op Problem   Johnathan [name] is a 68 y.o. male.   The history is provided by the patient and medical records.  Johnathan [name] is a 68 y.o. male who presents to the Emergency Department complaining of  Throat swelling.  He presents the emergency department for evaluation of throat swelling after removing his bandage to his neck.  He complains of discomfort and pressure and sore throat when he swallows.  No difficulty breathing.  He is status post hypoglossal nerve stimulator placement on July 21.  He did resume his Xarleto Monday night and took his last dose Tuesday at 9 PM.  He has a history of A-fib and is scheduled for an ablation on August 12.  No chest pain, fevers.    Prior to Admission medications   Medication Sig Start Date End Date Taking? Authorizing Provider  acetaminophen  (TYLENOL ) 500 MG tablet Take 1 tablet (500 mg total) by mouth every 6 (six) hours. Take every 6 hrs x 2-3 days then take every 6 hrs as needed 07/18/24   Soldatova, Liuba, MD  allopurinol  (ZYLOPRIM ) 300 MG tablet Take 1 tablet (300 mg total) by mouth daily. Patient taking differently: Take 150 mg by mouth 2 (two) times daily. 09/28/23   Kuneff, Renee A, DO  amoxicillin -clavulanate (AUGMENTIN ) 875-125 MG tablet Take 1 tablet by mouth 2 (two) times daily. 07/18/24   Soldatova, Liuba, MD  atorvastatin  (LIPITOR) 80 MG tablet Take 1 tablet (80 mg total) by mouth daily. 04/18/24   Court Dorn PARAS, MD  diltiazem  (CARDIZEM ) 30 MG tablet Take 1 Tablet Every 4 Hours As Needed For HR >100 and Top BP >100 Patient not taking: Reported on 04/28/2024 04/25/24   Terra Fairy PARAS, PA-C  dofetilide  (TIKOSYN ) 500 MCG capsule Take 1 capsule (500 mcg total) by mouth 2 (two) times daily. 08/13/23   Court Dorn PARAS, MD  escitalopram  (LEXAPRO ) 5 MG tablet Take 1 tablet  (5 mg total) by mouth daily. 03/08/24   Rollene Almarie LABOR, MD  eszopiclone  (LUNESTA ) 2 MG TABS tablet Take 1 tablet (2 mg total) by mouth at bedtime as needed for sleep. 03/18/24   Rollene Almarie LABOR, MD  ezetimibe  (ZETIA ) 10 MG tablet Take 1 tablet (10 mg total) by mouth daily. 04/18/24 07/18/24  Court Dorn PARAS, MD  ibuprofen  (ADVIL ) 600 MG tablet Take 1 tablet (600 mg total) by mouth every 6 (six) hours. Please take every 6 hrs, and stagger this medication 3 hrs apart from Tylenol  07/18/24   Soldatova, Liuba, MD  Multiple Vitamin (MULTIVITAMIN WITH MINERALS) TABS tablet Take 1 tablet by mouth in the morning.    [provider]  oxyCODONE  (ROXICODONE ) 5 MG immediate release tablet Take 1 tablet (5 mg total) by mouth every 6 (six) hours as needed for severe pain (pain score 7-10). 07/18/24   Soldatova, Liuba, MD  rivaroxaban  (XARELTO ) 20 MG TABS tablet Take 1 tablet (20 mg total) by mouth daily with supper. 03/02/24   Cindie Delon POUR, PA-C    Allergies: Sulfa antibiotics    Review of Systems  All other systems reviewed and are negative.   Updated Vital Signs BP (!) 158/88 (BP Location: Right Arm)   Pulse (!) 59   Temp 98.3 F (36.8 C)   Resp 17   Ht 6' 1 (1.854 m)  Wt 102.1 kg   SpO2 99%   BMI 29.69 kg/m   Physical Exam Vitals and nursing note reviewed.  Constitutional:      Appearance: He is well-developed.  HENT:     Head: Normocephalic and atraumatic.     Comments: Posterior oropharynx is without erythema or edema.  There is soft tissue swelling to the anterior neck diffusely.  Minimal local tenderness. Cardiovascular:     Rate and Rhythm: Normal rate and regular rhythm.     Heart sounds: No murmur heard. Pulmonary:     Effort: Pulmonary effort is normal. No respiratory distress.     Breath sounds: Normal breath sounds. No stridor.     Comments: Mild local soft tissue swelling over the upper chest. Abdominal:     Palpations: Abdomen is soft.      Tenderness: There is no abdominal tenderness. There is no guarding or rebound.  Musculoskeletal:        General: No tenderness.  Skin:    General: Skin is warm and dry.  Neurological:     Mental Status: He is alert and oriented to person, place, and time.  Psychiatric:        Behavior: Behavior normal.        (all labs ordered are listed, but only abnormal results are displayed) Labs Reviewed  CBC WITH DIFFERENTIAL/PLATELET  COMPREHENSIVE METABOLIC PANEL WITH GFR  PROTIME-INR  TYPE AND SCREEN    EKG: None  Radiology: DG Neck Soft Tissue Result Date: 07/18/2024 EXAM: 1 VIEW XRAY OF THE SOFT TISSUE NECK 07/18/2024 02:51:41 PM COMPARISON: CT of 05/29/2023. CLINICAL HISTORY: Surgery follow-up examination. A hypoglossal nerve stimulator was placed in the neck intraoperatively. FINDINGS: SOFT TISSUES: No prevertebral soft tissue swelling. A metallic foreign body, presumably the nerve stimulator, projects over the soft tissues of the anterior neck, terminating over the posterior mandible. No lead discontinuity identified. EPIGLOTTIS: No epiglottic thickening. BONES: No acute osseous abnormality. IMPRESSION: 1. Hypoglossal nerve stimulator in the anterior neck, terminating over the posterior mandible, with no lead discontinuity identified. Electronically signed by: Rockey Kilts MD 07/18/2024 06:49 PM EDT RP Workstation: [id number]   DG Chest Portable 1 View Result Date: 07/18/2024 CLINICAL DATA:  Postop EXAM: PORTABLE CHEST 1 VIEW COMPARISON:  02/14/2024 FINDINGS: Interim placement of right-sided stimulator device with ascending lead over the right neck. Lead tip in the right submandibular region. Gas in the right neck soft tissues presumably postoperative. Left lung base incompletely included. No focal opacity, pleural effusion or pneumothorax. IMPRESSION: Interim placement of right-sided stimulator device. No radiographic evidence for acute cardiopulmonary abnormality. Electronically Signed    By: Luke Bun M.D.   On: 07/18/2024 15:04     Procedures   Medications Ordered in the ED - No data to display                                  Medical Decision Making Amount and/or Complexity of Data Reviewed Labs: ordered. Radiology: ordered.  Risk Prescription drug management.   Patient with history of atrial fibrillation on anticoagulation, recent hypoglossal nerve stimulator placement here for evaluation of postoperative swelling.  He does have soft tissue swelling to the surgical site, no significant erythema.  No airway compromise.  Labs were obtained, which were significant for leukocytosis, hyponatremia.  CT scan of the neck does demonstrate some postoperative swelling without fluid collection.  Discussed with Dr. Carlie with ENT.  Dr. Soldatova evaluated the  patient in the emergency department.  He is already on antibiotics.  Plan to discharge home with close outpatient follow-up and return precautions.  Given his hyponatremia will treat with small IV fluid bolus, suspect this is in the setting of decreased oral intake.  Feel he is stable for discharge home.  Discussed that he will need close PCP follow-up for recheck of his sodium.  Discussed return precautions for any progressive or new concerning symptoms.     Final diagnoses:  None    ED Discharge Orders     None          Griselda Norris, MD 07/20/24 [phone]

## 2024-07-20 NOTE — Progress Notes (Signed)
 ENT Progress Note  Patient is POD#1 s/p Inspire implant placement and was concerned about the way incisions appeared to him at home when he took the bandages off. He presented to ED, where CT neck was done and there was no evidence of fluid collection or hematoma.   I evaluated the patient in ED and his incision sites had no evidence of swelling or fluctuance, and I reassured the patient. He will return to see me in 2 weeks for wound check as planned.   Elena Larry, MD

## 2024-07-20 NOTE — ED Notes (Signed)
 Ccmd called to place pt on monitor

## 2024-08-01 ENCOUNTER — Ambulatory Visit (INDEPENDENT_AMBULATORY_CARE_PROVIDER_SITE_OTHER): Admitting: Otolaryngology

## 2024-08-01 ENCOUNTER — Encounter (INDEPENDENT_AMBULATORY_CARE_PROVIDER_SITE_OTHER): Payer: Self-pay | Admitting: Otolaryngology

## 2024-08-01 ENCOUNTER — Telehealth: Payer: Self-pay | Admitting: Cardiology

## 2024-08-01 VITALS — BP 143/79 | HR 76

## 2024-08-01 DIAGNOSIS — R0981 Nasal congestion: Secondary | ICD-10-CM

## 2024-08-01 DIAGNOSIS — Z789 Other specified health status: Secondary | ICD-10-CM

## 2024-08-01 DIAGNOSIS — J3089 Other allergic rhinitis: Secondary | ICD-10-CM

## 2024-08-01 DIAGNOSIS — Z9889 Other specified postprocedural states: Secondary | ICD-10-CM

## 2024-08-01 DIAGNOSIS — G4733 Obstructive sleep apnea (adult) (pediatric): Secondary | ICD-10-CM

## 2024-08-01 MED ORDER — FLUTICASONE PROPIONATE 50 MCG/ACT NA SUSP: 2.0000 | Freq: Two times a day (BID) | NASAL | 6 refills | Status: DC

## 2024-08-01 MED ORDER — LEVOCETIRIZINE DIHYDROCHLORIDE 5 MG PO TABS: 5.0000 mg | ORAL_TABLET | Freq: Every evening | ORAL | 3 refills | Status: DC

## 2024-08-01 NOTE — Progress Notes (Signed)
 ENT Progress Note:   Update 08/01/2024  Discussed the use of AI scribe software for clinical note transcription with the patient, who gave verbal consent to proceed.  History of Present Illness  He presents for post-op f/u after Inspire implant surgery 07/18/24. Doing well, no significant pain. Has a sensation of fluid in his ears and nasal congestion.   Records Reviewed:  Initial Evaluation  Reason for Consult: OSA CPAP intolerance  Ref: PCP Johnathan Heinrich, PA, Dr Johnathan [name] read HST in 2024   HPI: Discussed the use of AI scribe software for clinical note transcription with the patient, who gave verbal consent to proceed.  History of Present Illness Johnathan [name] is a 68 year old male with hx of obstructive sleep apnea who presents for evaluation of CPAP intolerance. He was referred by his primary care doctor for evaluation of his sleep apnea and CPAP intolerance  He has a history of sleep apnea diagnosed in 2012 or 2013. Initially, he used a CPAP machine but discontinued due to intolerance. He has tried various CPAP masks, including nasal pillows and full face masks, but experiences issues such as claustrophobia and air leaks due to sleeping with his mouth open. Currently, he uses a full face mask but finds it uncomfortable and often removes it during sleep.  Not using the CPAP machine affects his heart rhythm, leading to episodes of atrial fibrillation. He is currently on Xarelto  for this condition. He has experienced two recent cardioversions, one in March and another two to three weeks ago, to restore normal heart rhythm. He is scheduled for a catheter ablation for atrial fibrillation on August 12, 2024.  He experiences difficulty staying asleep, which he describes as not being able to maintain sleep rather than difficulty falling asleep. He takes Lunesta  approximately every third day, which helps to some extent, but he does not feel well when taking it daily.  He has unilateral  hearing loss and has been told by other doctors that it may be due to a neurological cause. He has undergone tests, including an MRI, and has tried hearing aids, but finds them ineffective, especially in noisy environments.  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 He is cautious about physical activity due to his heart condition and is looking forward to improving his quality of life post-ablation. No history of strokes, heart attacks, or lung disease.     Records Reviewed:  PCP note 08/29/20 Assessment/plan: Johnathan [name] is a 68 y.o. male present for cpe Permanent atrial fibrillation (HCC)/Pure hypercholesterolemia/obesity  Stable. >prescribed metoprolol  prn and Tikosyn  by cardiology Established w/ [name] Dr. Court. Continue routine follow ups. continue daily asa 81.  Continue statin - labs DUE- cbc, bmp, tsh, lipid orders placed for collection week of 9/20 fasting.  Diet and exercise modifications discussed.    Anxiety - Stable.  - continuelexapro 5 mg QD - follow up every  5.5 mos.    OSA on CPAP - follows with  Pulmonology compliant with CPAP   Past Medical History:  Diagnosis Date   Atrial fib/flutter, transient (HCC)    patient takes tykosyn   Cutaneous skin tags 01/09/2022   Dysrhythmia    a-fib   Elevated LFTs    Gout    hands and feet   Hearing aid worn 2021   right   Hyperlipidemia    Persistent atrial fibrillation (HCC)    RLS (restless legs  syndrome)    Sleep apnea    uses CPAP    Past Surgical History:  Procedure Laterality Date   ATRIAL FIBRILLATION ABLATION  2015   BONE EXCISION Right 05/09/2021   Procedure: RIGHT TIBIA EXCISION;  Surgeon: Johnathan Bonner DASEN, MD;  Location: [facility];  Service: Orthopedics;  Laterality: Right;   CARDIOVERSION N/A 03/03/2024   Procedure: CARDIOVERSION;  Surgeon: Johnathan Stanly LABOR, MD;  Location: [facility];   Service: Cardiovascular;  Laterality: N/A;   CARDIOVERSION N/A 04/29/2024   Procedure: CARDIOVERSION;  Surgeon: Johnathan Soyla LABOR, MD;  Location: [facility];  Service: Cardiovascular;  Laterality: N/A;   COLONOSCOPY     DRUG INDUCED ENDOSCOPY N/A 05/31/2024   Procedure: DRUG INDUCED SLEEP ENDOSCOPY;  Surgeon: Johnathan Burns, MD;  Location: [facility];  Service: ENT;  Laterality: N/A;   ELBOW SURGERY Left    tendinitis   EXCISION MORTON'S NEUROMA Right 05/09/2021   Procedure: EXCISION OF RIGHT SECOND TOE MASS;  Surgeon: Johnathan Lonni SAUNDERS, MD;  Location: [facility];  Service: Orthopedics;  Laterality: Right;   FOOT SURGERY Left    HERNIA REPAIR     IMPLANTATION OF HYPOGLOSSAL NERVE STIMULATOR N/A 07/18/2024   Procedure: INSERTION, HYPOGLOSSAL NERVE STIMULATOR;  Surgeon: Johnathan Burns, MD;  Location: [facility];  Service: ENT;  Laterality: N/A;   KNEE ARTHROSCOPY W/ DEBRIDEMENT Right    LUMBAR DISC SURGERY  2012   L3-L4, No hardware   metatarsal fusion     right large toe   TRANSESOPHAGEAL ECHOCARDIOGRAM (CATH LAB) N/A 03/03/2024   Procedure: TRANSESOPHAGEAL ECHOCARDIOGRAM;  Surgeon: Johnathan Stanly LABOR, MD;  Location: [facility];  Service: Cardiovascular;  Laterality: N/A;    Family History  Problem Relation Age of Onset   Diabetes Mother    Diabetes Brother    Early death Brother    Heart disease Brother    Colon cancer Neg Hx    Esophageal cancer Neg Hx    Rectal cancer Neg Hx    Stomach cancer Neg Hx    Liver disease Neg Hx    Pancreatic cancer Neg Hx    Colon polyps Neg Hx     Social History:  reports that he has never smoked. He quit smokeless tobacco use about 25 years ago.  His smokeless tobacco use included chew. He reports [name] alcohol use of about 1.0 standard drink of alcohol per week. He reports that he does not use drugs.  Allergies:  Allergies  Allergen Reactions   Sulfa Antibiotics     Unknown reaction in childhood     Medications: I have reviewed the patient's [name] medications.  The PMH, PSH, Medications, Allergies, and SH were reviewed and updated.  ROS: Constitutional: Negative for fever, weight loss and weight gain. Cardiovascular: Negative for chest pain and dyspnea on exertion. Respiratory: Is not experiencing shortness of breath at rest. Gastrointestinal: Negative for nausea and vomiting. Neurological: Negative for headaches. Psychiatric: The patient is not nervous/anxious  Blood pressure (!) 143/79, pulse 76, SpO2 97%. There is no height or weight on file to calculate BMI.  PHYSICAL EXAM:  Exam: General: Well-developed, well-nourished Respiratory Respiratory effort: Equal inspiration and expiration without stridor Cardiovascular Peripheral Vascular: Warm extremities with equal color/perfusion Eyes: No nystagmus with equal extraocular motion bilaterally Neuro/Psych/Balance: Patient oriented to person, place, and time; Appropriate mood and affect; Gait is intact with no imbalance; Cranial nerves I-XII are intact Head and Face Inspection: Normocephalic and atraumatic without mass  or lesion Palpation: Facial skeleton intact without bony stepoffs Salivary Glands: No mass or tenderness  Facial Strength: Facial motility symmetric and full bilaterally ENT Pinna: External ear intact and fully developed External canal: Canal is patent with intact skin Tympanic Membrane: Clear and mobile External Nose: No scar or anatomic deformity Internal Nose: Septum is relatively straight on anterior rhinoscopy. Bilateral inferior turbinate hypertrophy.  Lips, Teeth, and gums: Mucosa and teeth intact and viable Oral cavity/oropharynx: No erythema or exudate, no lesions present Neck Neck and Trachea: Midline trachea without mass or lesion Thyroid : No mass or nodularity Lymphatics: No lymphadenopathy Submental and right upper chest incision are healed completely, clean and dry without  erythema  Studies Reviewed: Home Sleep Study 09/13/23  - Moderate obstructive sleep apnea occurred during this study (AHI 29.3/h). There is a significant positional component with supine sleep AHI 41.2/h versus non-supine sleep AHI 8.7/h. - Mild oxygen desaturation was noted during this study to a nadir of 87%. - No snoring was audible during this study.  No central or mixed apneas noted  BMI of 30  Interpreted by Dr Johnathan Cardiology   Assessment/Plan: Encounter Diagnoses  Name Primary?   Obstructive sleep apnea Yes   Intolerance of continuous positive airway pressure (CPAP) ventilation    Environmental and seasonal allergies    Chronic nasal congestion     Assessment & Plan Obstructive Sleep Apnea CPAP intolerance  Moderate obstructive sleep apnea CPAP intolerance and BMI of 30. He is a suitable for Inspire Implant. Previous CPAP intolerance 2/2 being claustrophobic and having air leak around the mask. OSA, moderate, without multilevel collapse, with failure to tolerate PAP therapy and/or more conservative measures. Presence of smaller/absent tonsils and larger tongue position (Friedman tongue position or modified Mallampati) suggests that hypopharyngeal/retrolingual collapse is contributing to the patient's OSA. Janeth, M et al. Staging of obstructive sleep apnea/hypopnea syndrome: a guide to appropriate treatment. Laryngoscope, 2004 Mar, 114(3):454-9. PMID: [id number]) Options including positional therapy, weight loss, oral appliances, PAP and surgical correction discussed. Pt is not an ideal candidate for oral appliance due to severity of OSA Pt could be a candidate for Hypoglossal nerve stimulation (Inspire therapy) pending DISE  - Schedule drug-induced sleep endoscopy to assess upper airway during sleep. - Proceed with Inspire Implant surgery if pre-approval criteria are met. - Coordinate with cardiologist regarding Xarelto  management pre- and post-surgery. - Advise avoidance of  strenuous activities for four weeks post-surgery. - Schedule follow-up with sleep physician for device activation and titration post-surgery.  Atrial Fibrillation Atrial fibrillation with recent cardioversions. Catheter ablation scheduled in Aug 2025. Sleep apnea management crucial for sinus rhythm maintenance. Anticoagulation coordination needed for procedures. - Coordinate with cardiologist regarding Xarelto  management for upcoming Inspire Implant surgery and catheter ablation. - Proceed with catheter ablation as scheduled on August 16.  Unilateral Hearing Loss Unilateral hearing loss with inadequate [name] hearing aids (bought at [facility]). CROS orBi-CORS hearing aids may improve hearing by rerouting sound to the better ear. He already had MRI in the past and it was negative  - Recommend consultation with a hearing aid dispensary for evaluation.  Update 08/02/23  S/p Inspire implant, recovering as expected, incisions healed well, CN XII intact.   - proceed with Inspire activation as scheduled  - trial of Xyzal  and Flonase  for nasal congestion   Elena Larry, MD Otolaryngology [facility] Phone: [phone] Fax: [phone]    08/01/2024, 4:12 PM

## 2024-08-01 NOTE — Telephone Encounter (Signed)
 Pt c/o medication issue:  1. Name of Medication:   fluticasone  (FLONASE ) 50 MCG/ACT nasal spray    levocetirizine (XYZAL  ALLERGY 24HR) 5 MG tablet    2. How are you currently taking this medication (dosage and times per day)? As written   3. Are you having a reaction (difficulty breathing--STAT)? No   4. What is your medication issue? Pt called in asking if these meds are safe to take with Tikosyn . Please advise.

## 2024-08-02 ENCOUNTER — Telehealth (HOSPITAL_COMMUNITY): Payer: Self-pay

## 2024-08-02 NOTE — Telephone Encounter (Signed)
 Pavero, Lonni, RPH to [facility] Triage (Selected Message)  08/02/24 12:43 PM Yes, ok to take with Tikosyn   Gave the pt the information above. He verbalized understanding

## 2024-08-02 NOTE — Telephone Encounter (Signed)
 Spoke with patient to discuss upcoming procedure.   Labs: will repeat labs by 8/7.   Any recent signs of acute illness or been started on antibiotics? Pt is s/p Inspire implant on 7/21 and has completed a 10-day course of Augmentin . He had an ED visit on 7/23 for concerns of postoperative edema in neck. Reports swelling has significantly decreased. ENT surgical follow up on yesterday, 8/4, recovering as expected, incisions healed well per note.     Any new medications started? No Any medications to hold? No   Any missed doses of blood thinner?  No Advised patient to continue taking ANTICOAGULANT: Xarelto  (Rivaroxaban ) daily without missing any doses.  Medication instructions:  On the morning of your procedure DO NOT take any medication., including Xarelto  or the procedure may be rescheduled. Nothing to eat or drink after midnight prior to your procedure.  Confirmed patient is scheduled for Atrial Fibrillation Ablation on Tuesday, August 12 with Dr. Soyla Norton. Instructed patient to arrive at the Main Entrance A at [facility]: [address] [place], [place] [address] and check in at Admitting at 8:00 AM.  Advised of plan to go home the same day and will only stay overnight if medically necessary. You MUST have a responsible adult to drive you home and MUST be with you the first 24 hours after you arrive home or your procedure could be cancelled.  Patient verbalized understanding to all instructions provided and agreed to proceed with procedure.

## 2024-08-02 NOTE — Telephone Encounter (Signed)
 Attempted to reach patient to discuss upcoming procedure. Patient states he is in a meeting and requested a call back later.

## 2024-08-03 ENCOUNTER — Telehealth: Payer: Self-pay | Admitting: Internal Medicine

## 2024-08-03 NOTE — Telephone Encounter (Signed)
 Pt has a upcoming appt soon with Corean Ku on 8.7.25 @ 8:20AM. Pt is wondering if he could get blood work done for this appt. I mentioned to him that he has not had a physical / preventative with and with the symptoms he's having it will be talked out of your physical if we set you up for one-- we can make you an acute visit and when you get here you can discuss a CPE and blood work with your PCP to see when she recommends that for you. Pt understood just wanted you to know the request just in case you WANT blood work.SABRA

## 2024-08-04 ENCOUNTER — Telehealth: Payer: Self-pay | Admitting: Cardiology

## 2024-08-04 ENCOUNTER — Ambulatory Visit (HOSPITAL_BASED_OUTPATIENT_CLINIC_OR_DEPARTMENT_OTHER)
Admission: RE | Admit: 2024-08-04 | Discharge: 2024-08-04 | Disposition: A | Discharge status: Home / Self Care | Source: Ambulatory Visit | Attending: Family Medicine | Admitting: Family Medicine

## 2024-08-04 ENCOUNTER — Encounter: Payer: Self-pay | Admitting: Family Medicine

## 2024-08-04 ENCOUNTER — Ambulatory Visit: Payer: Self-pay | Admitting: Family Medicine

## 2024-08-04 ENCOUNTER — Other Ambulatory Visit: Payer: Self-pay | Admitting: Cardiovascular Disease

## 2024-08-04 ENCOUNTER — Ambulatory Visit: Admitting: Family Medicine

## 2024-08-04 VITALS — BP 164/90 | HR 69 | Temp 98.1°F | Ht 73.0 in | Wt 233.8 lb

## 2024-08-04 DIAGNOSIS — R221 Localized swelling, mass and lump, neck: Secondary | ICD-10-CM

## 2024-08-04 DIAGNOSIS — D6869 Other thrombophilia: Secondary | ICD-10-CM

## 2024-08-04 DIAGNOSIS — D6859 Other primary thrombophilia: Secondary | ICD-10-CM | POA: Insufficient documentation

## 2024-08-04 DIAGNOSIS — R7989 Other specified abnormal findings of blood chemistry: Secondary | ICD-10-CM | POA: Insufficient documentation

## 2024-08-04 DIAGNOSIS — Z7901 Long term (current) use of anticoagulants: Secondary | ICD-10-CM

## 2024-08-04 DIAGNOSIS — M542 Cervicalgia: Secondary | ICD-10-CM | POA: Diagnosis not present

## 2024-08-04 DIAGNOSIS — I4819 Other persistent atrial fibrillation: Secondary | ICD-10-CM

## 2024-08-04 DIAGNOSIS — D72829 Elevated white blood cell count, unspecified: Secondary | ICD-10-CM | POA: Insufficient documentation

## 2024-08-04 DIAGNOSIS — E871 Hypo-osmolality and hyponatremia: Secondary | ICD-10-CM

## 2024-08-04 DIAGNOSIS — I4811 Longstanding persistent atrial fibrillation: Secondary | ICD-10-CM

## 2024-08-04 DIAGNOSIS — D72828 Other elevated white blood cell count: Secondary | ICD-10-CM

## 2024-08-04 LAB — COMPREHENSIVE METABOLIC PANEL WITH GFR
ALT: 26 U/L (ref 0–53)
AST: 21 U/L (ref 0–37)
Albumin: 4.4 g/dL (ref 3.5–5.2)
Alkaline Phosphatase: 107 U/L (ref 39–117)
BUN: 14 mg/dL (ref 6–23)
CO2: 25 meq/L (ref 19–32)
Calcium: 9.9 mg/dL (ref 8.4–10.5)
Chloride: 101 meq/L (ref 96–112)
Creatinine, Ser: 0.88 mg/dL (ref 0.40–1.50)
GFR: 88.41 mL/min (ref >60.00)
Glucose, Bld: 100 mg/dL — ABNORMAL HIGH (ref 70–99)
Potassium: 4.5 meq/L (ref 3.5–5.1)
Sodium: 140 meq/L (ref 135–145)
Total Bilirubin: 2.2 mg/dL — ABNORMAL HIGH (ref 0.2–1.2)
Total Protein: 7 g/dL (ref 6.0–8.3)

## 2024-08-04 LAB — CBC WITH DIFFERENTIAL/PLATELET
Basophils Absolute: 0 K/uL (ref 0.0–0.1)
Basophils Relative: 0.4 % (ref 0.0–3.0)
Eosinophils Absolute: 0.1 K/uL (ref 0.0–0.7)
Eosinophils Relative: 2.1 % (ref 0.0–5.0)
HCT: 43.9 % (ref 39.0–52.0)
Hemoglobin: 14.8 g/dL (ref 13.0–17.0)
Lymphocytes Relative: 16.1 % (ref 12.0–46.0)
Lymphs Abs: 1.1 K/uL (ref 0.7–4.0)
MCHC: 33.8 g/dL (ref 30.0–36.0)
MCV: 90.2 fl (ref 78.0–100.0)
Monocytes Absolute: 0.4 K/uL (ref 0.1–1.0)
Monocytes Relative: 6.2 % (ref 3.0–12.0)
Neutro Abs: 5.1 K/uL (ref 1.4–7.7)
Neutrophils Relative %: 75.2 % (ref 43.0–77.0)
Platelets: 296 K/uL (ref 150.0–400.0)
RBC: 4.87 Mil/uL (ref 4.22–5.81)
RDW: 14.6 % (ref 11.5–15.5)
WBC: 6.8 K/uL (ref 4.0–10.5)

## 2024-08-04 LAB — D-DIMER, QUANTITATIVE: D-Dimer, Quant: 1.08 ug{FEU}/mL — ABNORMAL HIGH (ref <0.50)

## 2024-08-04 LAB — C-REACTIVE PROTEIN: CRP: <1 mg/dL (ref 0.5–20.0)

## 2024-08-04 LAB — TSH: TSH: 2.89 u[IU]/mL (ref 0.35–5.50)

## 2024-08-04 LAB — SEDIMENTATION RATE: Sed Rate: 6 mm/h (ref 0–20)

## 2024-08-04 LAB — VITAMIN B12: Vitamin B-12: 773 pg/mL (ref 211–911)

## 2024-08-04 MED ORDER — IOHEXOL 350 MG/ML SOLN
50.0000 mL | Freq: Once | INTRAVENOUS | Status: AC | PRN
  Administered 2024-08-04: 50 mL via INTRAVENOUS

## 2024-08-04 NOTE — Telephone Encounter (Signed)
 Patient notified, STAT imaging ordered and scheduled today for CT

## 2024-08-04 NOTE — Telephone Encounter (Signed)
 Pt would like a c/b regarding upcoming Ablation. Pt states that he had an Inspire Implant put in 2 weeks ago and may have to go back on an ABT. Pt would like to know if he does have to go on ABT will he still be able to have Ablation on 8/12. Please advise

## 2024-08-04 NOTE — Progress Notes (Signed)
 Acute Office Visit  Subjective:     Patient ID: Johnathan [name], male    DOB: Sep 25, 1956, 68 y.o.   MRN: [id number]  No chief complaint on file.   HPI  Discussed the use of AI scribe software for clinical note transcription with the patient, who gave verbal consent to proceed.  History of Present Illness Johnathan [name] is a 68 year old male who presents with pain and swelling following an Inspire implant surgery.  Postoperative neck pain and swelling - Onset two days after Inspire implant surgery - Significant neck swelling and pain prompted emergency room evaluation - Persistent swelling at the surgical site - Pain originates from the device attachment site on the tongue and radiates to the back of the head - Pain described as a sensation of something 'digging in', particularly severe with chewing or swallowing - No pain with other activities such as bending over - CT scan performed post-surgery due to swelling; no ultrasound performed - Concern regarding persistent swelling and its impact on daily activities  Dysphagia and dietary impact - Pain with swallowing, especially with solid foods - Pain intensity has increased, now limiting oral intake to mostly soups - Pain subsides when not eating - Difficulty eating solid foods due to pain  A fib - on xarelto  - ablation scheduled 08/09/24     ROS Per HPI      Objective:    BP (!) 164/90 (BP Location: Left Arm, Patient Position: Sitting)   Pulse 69   Temp 98.1 F (36.7 C) (Temporal)   Ht 6' 1 (1.854 m)   Wt 233 lb 12.8 oz (106.1 kg)   SpO2 96%   BMI 30.85 kg/m    Physical Exam Vitals and nursing note reviewed.  Constitutional:      General: He is not in acute distress.    Appearance: Normal appearance.  HENT:     Head: Normocephalic and atraumatic.     Jaw: Swelling present.      Comments: Area of swelling, closed surgical incision. No erythema, non tender, no bruising, no discharge or bleeding.     Right Ear: External ear normal.     Left Ear: External ear normal.     Nose: Nose normal.     Mouth/Throat:     Mouth: Mucous membranes are moist.     Pharynx: Oropharynx is clear.  Eyes:     Extraocular Movements: Extraocular movements intact.  Neck:     Comments: Right sided Cardiovascular:     Rate and Rhythm: Normal rate and regular rhythm.     Pulses: Normal pulses.     Heart sounds: Normal heart sounds.  Pulmonary:     Effort: Pulmonary effort is normal. No respiratory distress.     Breath sounds: Normal breath sounds. No wheezing, rhonchi or rales.  Musculoskeletal:        General: Normal range of motion.     Cervical back: Normal range of motion.     Right lower leg: No edema.     Left lower leg: No edema.  Lymphadenopathy:     Cervical: Cervical adenopathy present.  Skin:    General: Skin is warm and dry.  Neurological:     General: No focal deficit present.     Mental Status: He is alert and oriented to person, place, and time.  Psychiatric:        Mood and Affect: Mood normal.        Behavior: Behavior normal.  No results found for any visits on 08/04/24.      Assessment & Plan:   Assessment and Plan Assessment & Plan Postoperative pain and swelling after Inspire implant Persistent neck swelling and pain post-Inspire implant, possibly due to nerve or muscle pressure. Infection is suspected and will be treated if confirmed; further evaluation needed. - Order inflammatory markers. - Order CBC. - Order D-dimer. - Consider angiogram if D-dimer elevated. - Avoid steroids until infection and clotting issues ruled out.  Atrial fibrillation, status post cardioversion and planned ablation Atrial fibrillation in normal rhythm post-cardioversion. Concerns about neck swelling affecting upcoming ablation. - Continue Xarelto . - Monitor for complications affecting ablation. - Ensure pre-ablation requirements met.  Hyponatremia Hyponatremia likely due to  excessive water intake. Discussed importance of balanced sodium intake. - Recheck sodium levels with CMP. - Advise on balanced fluid and sodium intake.  Leukocytosis Leukocytosis noted, possibly indicating infection given postoperative status. - Recheck white blood cell count with CBC. - Consider antibiotics if leukocytosis persists and infection suspected.  Hyperbilirubinemia Mild hyperbilirubinemia possibly related to surgery and fluid intake. - Monitor liver function tests.     Orders Placed This Encounter  Procedures   TSH   Vitamin B12   Sedimentation rate   C-reactive protein   D-dimer, quantitative    Standing Status:   Future    Number of Occurrences:   1    Expiration Date:   08/04/2025    Release to patient:   Immediate [1]   CBC with Differential/Platelet    Release to patient:   Immediate [1]   Comprehensive metabolic panel with GFR    Release to patient:   Immediate [1]     No orders of the defined types were placed in this encounter.   Return if symptoms worsen or fail to improve.  Corean LITTIE Ku, FNP

## 2024-08-04 NOTE — Telephone Encounter (Signed)
 Pt has had some pain in the back of his head/neck post Inspire procedure.  He saw MD yesterday who did some blood work to see if WBCs are elevated indicating possible infection post procedure.  They may start him on abx if so. Pt will update us  today on result and if an abx is started.

## 2024-08-04 NOTE — Patient Instructions (Signed)
 We are checking labs today, will be in contact with any results that require further attention  If white blood cell count is high, we will send in broad-spectrum antibiotics to cover possible postoperative infection.  Continue Xarelto .  Will be in contact with lab results this afternoon with further instructions.  If D-dimer positive, will consider CT angiogram

## 2024-08-05 ENCOUNTER — Ambulatory Visit: Payer: Self-pay | Admitting: Family Medicine

## 2024-08-05 NOTE — Telephone Encounter (Signed)
 Left message for patient to call back

## 2024-08-05 NOTE — Telephone Encounter (Signed)
 Pt would like a c/b regarding whether Ablation will still take place on 8/12. Please advise

## 2024-08-08 ENCOUNTER — Encounter (HOSPITAL_COMMUNITY): Payer: Self-pay | Admitting: Certified Registered Nurse Anesthetist

## 2024-08-08 ENCOUNTER — Telehealth: Payer: Self-pay

## 2024-08-08 NOTE — Telephone Encounter (Signed)
 ERROR

## 2024-08-08 NOTE — Telephone Encounter (Signed)
 Per Dr. Inocencio, Okay to plan for ablation. I looked at his labs and it does not seem that there is any infection. Thanks.

## 2024-08-08 NOTE — Telephone Encounter (Signed)
 Called pt to inform him that his Insurance has denied his Afib Ablation with Dr. Inocencio on 8/12. He is very upset and wants to know what we can do about it. I reached out to our Pre-cert team to ask why it was denied to see if they are requesting additional information or a peer to peer call.   I informed the pt that I would be in touch with him as soon as I know something today.

## 2024-08-08 NOTE — Telephone Encounter (Signed)
 Expedited appeal has been sent with all information...   I returned call to pt instead of Nikki since I have been speaking with pt.  I informed him that we sent CT, Office notes, EKG, Echo, EP Study and Nerve Stimulator to his Insurance Co on 7/28 which was 15 days prior to his procedure date  (what the Ins Co requires)  We received the denial on 8/11 - the day before his procedure.  He has called his Ins Co and they told him they did not receive all of the documentation needed. I told him we have not received anything stating they needed additional information. All we received was a denial.   I told him that Dr. Inocencio had an open spot on 8/22 that I could move him to so we could have more time to get this straightened out and he agreed to that.   I explained to him that he could advocate for himself and call the insurance co so he will try again since we have resent all documentation again with the appeal.  He will call me back and let me know after he speaks with them and we will decide at that time if he will stay on for tomorrow or move to 8/22.  He is aware that he can pay self pay price that is approximately $100,000 for the procedure.

## 2024-08-08 NOTE — Telephone Encounter (Signed)
 Pt left me a message and I returned his call. He had 2 forms he had received from his Ins Co with information on the denial. I fwd these to our pre-cert team.  The pre-cert team contacted pt's Ins again and they stated since the appeal had been initiated they could not do a peer to peer until a determination had been made.   I explained this to the patient and told him they have 72 hours to give a final decision on the appeal and I feel it would be best at this time since it was 3 pm to move his procedure out to 8/22. He agreed with me and understands the process at this point.   He just ask that we keep in touch with him and keep him updated on the process. I told him I would try my best to keep him updated as I find out the information.   He is aware that his procedure has been moved to 8/22 at 7:30 pm..

## 2024-08-08 NOTE — Telephone Encounter (Signed)
 Pt is aware that his procedure has been rescheduled to 8/22.

## 2024-08-08 NOTE — Telephone Encounter (Signed)
 After speaking with our pre-cert team - I called pt to inform him that his Afib Ablation was denied due to they deemed it not medically necessary.   The pt is very upset. I explained to him that we sent them all the information they would need and it was up to them to approve or deny it. I told him that our pre-cert team supervisor suggested that he call his Insurance Co and speak with them.   He would like a call back from our office with the information that we sent them, what date it was sent. When the prior auth was initiated and when we found out that it was denied. Nikki Haematologist) will be calling the pt with that information.    Per Dr. Inocencio, we will also try to set up a peer to peer call for him to speak with the Insurance Co.

## 2024-08-11 ENCOUNTER — Telehealth: Payer: Self-pay | Admitting: Cardiology

## 2024-08-11 NOTE — Telephone Encounter (Signed)
 I called and spoke with the Johnathan [name] - His Insurance denied the appeal for his AF Ablation. We are trying to see if we can get a peer to peer set up with Dr. Inocencio to speak to his Insurance Co.   If not, per Dr. Inocencio, he would like for the Johnathan [name] to be seen in the Afib clinic.   The Johnathan [name] agrees with this plan.

## 2024-08-11 NOTE — Telephone Encounter (Signed)
 Pt would like a c/b from Sherri in regards to his insurance.

## 2024-08-12 NOTE — Telephone Encounter (Signed)
 Pt would like a c/b from April regarding insurance and Ablation. Please advise

## 2024-08-14 ENCOUNTER — Encounter (INDEPENDENT_AMBULATORY_CARE_PROVIDER_SITE_OTHER): Payer: Self-pay | Admitting: Otolaryngology

## 2024-08-15 ENCOUNTER — Telehealth: Payer: Self-pay

## 2024-08-15 ENCOUNTER — Ambulatory Visit: Admitting: Internal Medicine

## 2024-08-15 NOTE — Telephone Encounter (Signed)
 I called and spoke with pt. Since the appeal was denied they can not do a P2P call with Dr. Inocencio. They are stating that the last option at this point is for Dr. Inocencio to do a 2nd level appeal.   [employer] suggest that Dr. Inocencio review the [facility] policies before he submits the 2nd level appeal to make sure he meets the criteria to get the patient approved.   I explained to the patient that I have received all of the Voicemails he has left me since last week and all of the emails he has sent but I haven't responded to him yet because I don't have an answer for him. He is aware that I have fwd all of the information to Dr. Inocencio that is needed for the approval.   The pt is aware that I have spoke with Levon Gearing and she is the Supervisor of the Pre-cert team - she has called [employer] herself and left a message with Jinnie Amend which is who the pt has been speaking with. She left her direct number and ask her to call her back.   The patient states that he feels like everyone has done everything they can do other than Dr. Inocencio and if he doesn't do this 2nd level appeal he will go above him to complain that he didn't do everything he could for him.   I explained to the pt that Dr. Inocencio has documented everything in his chart and it has been sent to [facility] and we can't force them to approve the procedure.

## 2024-08-15 NOTE — Telephone Encounter (Signed)
 See Phone note message from 8/14.SABRASABRA

## 2024-08-15 NOTE — Telephone Encounter (Signed)
 I returned pt's call but I also informed him that I can NOT respond to every call and MyChart message and Email he sends me EVERY day. I have other patients to take care of. Also, I don't work in the pre-cert department so there's not much I can do to help get his procedure approved.  I have sent everything to Dr. Inocencio that he ask me to send and the pt is aware of that!!

## 2024-08-16 ENCOUNTER — Telehealth: Payer: Self-pay | Admitting: *Deleted

## 2024-08-16 ENCOUNTER — Telehealth: Payer: Self-pay | Admitting: Cardiology

## 2024-08-16 ENCOUNTER — Encounter: Payer: Self-pay | Admitting: Cardiology

## 2024-08-16 NOTE — Telephone Encounter (Signed)
 error

## 2024-08-16 NOTE — Telephone Encounter (Signed)
 Called and spoke to pt, informed him of need to cancel ablation while we work on second level appeal.  Pt understands I will call as soon as I can to get him rescheduled once we complete appeal.  Patient verbalized understanding and agreeable to plan.

## 2024-08-16 NOTE — Telephone Encounter (Signed)
 Pt needs to speak with Maeola or Levon regarding appeal for ablation.  Pt wants to make sure that no appeal gets sent directly to [facility] and instead a second level appeal goes to [employer]. Her email is [email or web address]. She is going to review appeal before it gets sent on.

## 2024-08-16 NOTE — Telephone Encounter (Signed)
 Patient called to follow-up on his second appeal to his insurance company and requested a call back from Chester H.

## 2024-08-16 NOTE — Telephone Encounter (Signed)
 Called and spoke to pt

## 2024-08-16 NOTE — Telephone Encounter (Signed)
 Pt needs to speak to either Sherri or Lincoln regarding appeal to receive ablation.

## 2024-08-19 ENCOUNTER — Ambulatory Visit (HOSPITAL_COMMUNITY): Admission: RE | Admit: 2024-08-19 | Source: Home / Self Care | Admitting: Cardiology

## 2024-08-19 ENCOUNTER — Encounter (HOSPITAL_COMMUNITY): Admission: RE | Payer: Self-pay | Source: Home / Self Care

## 2024-08-19 SURGERY — ATRIAL FIBRILLATION ABLATION
Anesthesia: General

## 2024-08-23 ENCOUNTER — Telehealth: Payer: Self-pay | Admitting: *Deleted

## 2024-08-23 NOTE — Telephone Encounter (Signed)
 Note for insurance per Dr. Inocencio:   He had an AF ablation in 2015 at [facility] and has had more palpitations. In addition, he has had coronary calcium  scoring 3/331/25 with a score in the 44th percentile and no ischemic symptoms and thus unlikely for ischemia to be causing his arrhythmia. Had a TSH 08/04/24 and is without thyroid  issues and other recent labs have not shown abnormalities which would cause his atrial arrhythmias. He has multiple ECGs in epic documenting atrial flutter and had a cardioversion on 03/03/24 and 04/29/24 as well. Per cardiology notes he was cardioverted for atrial fibrillation at the second procedure. He was in atrial flutter when I saw him in clinic but likely converted to atrial fibrillation before presenting to the hospital for cardioversion. There is a strip in the chart from 05/02/24 showing atrial fibrillation.  Therapy for sleep apnea will likely help control his atrial fibrillation but will not ultimately correct his arrhythmia. He has had breakthrough arrhythmia on antiarrhythmic medications, and this will need ablation to effectively treat his atrial fibrillation/flutter.

## 2024-08-25 ENCOUNTER — Encounter (INDEPENDENT_AMBULATORY_CARE_PROVIDER_SITE_OTHER): Payer: Self-pay | Admitting: Otolaryngology

## 2024-08-25 ENCOUNTER — Encounter: Payer: Self-pay | Admitting: Cardiology

## 2024-08-29 ENCOUNTER — Other Ambulatory Visit: Payer: Self-pay | Admitting: Internal Medicine

## 2024-09-01 ENCOUNTER — Telehealth (INDEPENDENT_AMBULATORY_CARE_PROVIDER_SITE_OTHER): Payer: Self-pay

## 2024-09-01 NOTE — Telephone Encounter (Signed)
 Called patient with an answer to his question. Patient understood. I told the patient to call us  back and let us  know how things go.

## 2024-09-02 ENCOUNTER — Other Ambulatory Visit: Payer: Self-pay | Admitting: Internal Medicine

## 2024-09-02 ENCOUNTER — Telehealth (INDEPENDENT_AMBULATORY_CARE_PROVIDER_SITE_OTHER): Payer: Self-pay

## 2024-09-02 NOTE — Telephone Encounter (Signed)
 Patient called back after talking with inspire tech guide as directed. Tech guide informed him that they do not do letters for insurance. Patient then called his insurance and they let him know that all they need is a letter from Dr. Okey explaining that the inspire implant is for sleep apnea and not Afib. It also needs to say that the inspire implant was to replace the CPAP machine. The insurance said that if they can get this letter that things should smooth out. Please advise, patient is running out of time to send letter.

## 2024-09-05 ENCOUNTER — Telehealth: Payer: Self-pay | Admitting: Cardiology

## 2024-09-05 ENCOUNTER — Telehealth (INDEPENDENT_AMBULATORY_CARE_PROVIDER_SITE_OTHER): Payer: Self-pay

## 2024-09-05 NOTE — Telephone Encounter (Signed)
 Pt aware I will be on the lookout for the letter and I would speak to our billing mngr tomorrow to determine next steps.  Aware I will be in touch as soon as we hear from insurance.  Patient verbalized understanding and agreeable to plan.

## 2024-09-05 NOTE — Telephone Encounter (Signed)
 Spoke with the patient who states that he is in the last process of his appeal for his ablation. He states that Maeola has been helping him with this and he has been working with Jinnie from [facility]. He states that he has a letter from the provider that did his Inspire implant that he is going to drop off at our office. He is also going to be sending it to [facility]. He states this was the last thing that was needed for his appeal. Advised patient that I would make Sherri aware.

## 2024-09-05 NOTE — Telephone Encounter (Signed)
 Patient calling to speak to the nurse about his insurance appeal process. Please advise

## 2024-09-05 NOTE — Telephone Encounter (Signed)
 Called patient back regarding letter. I let him know that he could come and pick it up today.

## 2024-09-26 NOTE — Telephone Encounter (Signed)
 Followed up with pt who reports he has not heard anything yet.  Says that insurance told him end of last week that we should have an answer soon/within a week. He will let me know if he hears anything and vice versa. He appreciates my follow up call.

## 2024-09-28 ENCOUNTER — Encounter: Payer: Self-pay | Admitting: Cardiology

## 2024-09-28 NOTE — Telephone Encounter (Signed)
 Pt returning nurses call as requested after speaking with his insurance company. Please advise

## 2024-09-28 NOTE — Telephone Encounter (Signed)
 Pt stating that our last option may be to appeal to an outside resource.  Before we do that we really to know what they [employer]) are missing and pt is going to bring the denial letter from insurance once he receives it.  Aware we will go from there once we know more.  Pt agreeable to this and really appreciates my follow  up call.   Will forward to scheduler to arrange 6 mo ROV (last seen in April, on Tikosyn  as well).  Pt aware she will reach out to schedule him.

## 2024-09-28 NOTE — Telephone Encounter (Signed)
 Routed to S. Price Arboriculturist.

## 2024-10-02 ENCOUNTER — Other Ambulatory Visit: Payer: Self-pay | Admitting: Family Medicine

## 2024-10-03 ENCOUNTER — Telehealth: Payer: Self-pay | Admitting: Cardiology

## 2024-10-03 NOTE — Telephone Encounter (Signed)
 Caller Young) is following up status of patient's ablation and external appeal.  Caller noted she faxed information regarding internal denial.

## 2024-10-06 ENCOUNTER — Other Ambulatory Visit: Payer: Self-pay | Admitting: Internal Medicine

## 2024-10-17 ENCOUNTER — Telehealth: Payer: Self-pay | Admitting: Cardiology

## 2024-10-17 NOTE — Telephone Encounter (Signed)
 Patient called back to say that his insurance will now cover his ablation. Calling to set an appt up for that. Please advise

## 2024-10-17 NOTE — Telephone Encounter (Signed)
 Spoke with the patient who states that his insurance has finally approved an ablation for him. He states that he has been doing well recently and has remained in normal rhythm. He states that he has been going to the gym 4 times per week. He is unsure of whether or not he needs to proceed with an ablation at this point since he is doing good. He does have an appointment with Dr. Inocencio on 11/11. He states that he is fine with waiting until then to discuss it further with Dr. Inocencio. He does state that if he does proceed with an ablation that he would like to have it done this year. Advised that message will be sent to his nurse about getting a spot for him this year.

## 2024-10-17 NOTE — Telephone Encounter (Signed)
 Pt made aware I would schedule him for last spot this year, 12/8.  Aware will determine if proceeding at his 11/10 OV. Patient verbalized understanding and agreeable to plan.

## 2024-11-06 NOTE — Progress Notes (Unsigned)
  Electrophysiology Office Note:   Date:  11/06/2024  ID:  Johnathan [name], DOB January 04, 1956, MRN [id number]  Primary Cardiologist: Dorn Lesches, MD Primary Heart Failure: None Electrophysiologist: Solly Derasmo Gladis Norton, MD  {Click to update primary MD,subspecialty MD or APP then REFRESH:1}    History of Present Illness:   Johnathan [name] is a 68 y.o. male with h/o sleep apnea, hyperlipidemia, atrial fibrillation/flutter seen today for routine electrophysiology followup.   Since last being seen in our clinic the patient reports doing ***.  he denies chest pain, palpitations, dyspnea, PND, orthopnea, nausea, vomiting, dizziness, syncope, edema, weight gain, or early satiety.   Review of systems complete and found to be negative unless listed in HPI.   EP Information / Studies Reviewed:    {EKGtoday:28818}       Risk Assessment/Calculations:    CHA2DS2-VASc Score = 1  {Confirm score is correct.  If not, click here to update score.  REFRESH note.  :1} This indicates a 0.6% annual risk of stroke. The patient's score is based upon: CHF History: 0 HTN History: 0 Diabetes History: 0 Stroke History: 0 Vascular Disease History: 0 Age Score: 1 Gender Score: 0             Physical Exam:   VS:  There were no vitals taken for this visit.   Wt Readings from Last 3 Encounters:  08/04/24 233 lb 12.8 oz (106.1 kg)  07/20/24 225 lb (102.1 kg)  07/18/24 225 lb (102.1 kg)     GEN: Well nourished, well developed in no acute distress NECK: No JVD; No carotid bruits CARDIAC: {EPRHYTHM:28826}, no murmurs, rubs, gallops RESPIRATORY:  Clear to auscultation without rales, wheezing or rhonchi  ABDOMEN: Soft, non-tender, non-distended EXTREMITIES:  No edema; No deformity   ASSESSMENT AND PLAN:    1.  Persistent atrial fibrillation/flutter: On dofetilide .  Feels poorly in atrial fibrillation.  Post cardioversion 04/29/24.***  2.  High risk medication monitoring: On dofetilide .  QTc stable.  3.   Obstructive sleep apnea: CPAP compliance encouraged  Follow up with {EPMDS:28135::EP Team} {EPFOLLOW LE:71826}  Signed, Rorie Delmore Gladis Norton, MD

## 2024-11-06 NOTE — H&P (View-Only) (Signed)
 Electrophysiology Office Note:   Date:  11/07/2024  ID:  Johnathan [name], DOB 07-Aug-1956, MRN [id number]  Primary Cardiologist: Dorn Lesches, MD Primary Heart Failure: None Electrophysiologist: Vonita Calloway Gladis Norton, MD      History of Present Illness:   Johnathan [name] is a 68 y.o. male with h/o sleep apnea, hyperlipidemia, atrial fibrillation/flutter seen today for routine electrophysiology followup.   Since last being seen in our clinic the patient reports doing well.  He has no chest pain or shortness of breath.  He has been exercising without further episodes of atrial fibrillation.  He has no acute complaints.  he denies chest pain, palpitations, dyspnea, PND, orthopnea, nausea, vomiting, dizziness, syncope, edema, weight gain, or early satiety.   Review of systems complete and found to be negative unless listed in HPI.   EP Information / Studies Reviewed:    EKG is ordered today. Personal review as below.  EKG Interpretation Date/Time:  Monday November 07 2024 08:39:25 EST Ventricular Rate:  74 PR Interval:  174 QRS Duration:  96 QT Interval:  382 QTC Calculation: 424 R Axis:   -43  Text Interpretation: Normal sinus rhythm Left axis deviation When compared with ECG of 20-Jul-2024 05:05, PREVIOUS ECG IS PRESENT Confirmed by Fama Muenchow ([id number]) on 11/07/2024 9:06:08 AM    Risk Assessment/Calculations:    CHA2DS2-VASc Score = 1   This indicates a 0.6% annual risk of stroke. The patient's score is based upon: CHF History: 0 HTN History: 0 Diabetes History: 0 Stroke History: 0 Vascular Disease History: 0 Age Score: 1 Gender Score: 0             Physical Exam:   VS:  BP 136/75 (BP Location: Left Arm, Patient Position: Sitting, Cuff Size: Large)   Pulse 74   Ht 6' 1 (1.854 m)   Wt 237 lb (107.5 kg)   SpO2 99%   BMI 31.27 kg/m    Wt Readings from Last 3 Encounters:  11/07/24 237 lb (107.5 kg)  08/04/24 233 lb 12.8 oz (106.1 kg)  07/20/24 225 lb (102.1 kg)      GEN: Well nourished, well developed in no acute distress NECK: No JVD; No carotid bruits CARDIAC: Regular rate and rhythm, no murmurs, rubs, gallops RESPIRATORY:  Clear to auscultation without rales, wheezing or rhonchi  ABDOMEN: Soft, non-tender, non-distended EXTREMITIES:  No edema; No deformity   ASSESSMENT AND PLAN:    1.  Persistent atrial fibrillation/flutter: On dofetilide .  Feels poorly in atrial fibrillation.  Post cardioversion 04/29/24.  Plan for atrial fibrillation in a month.  He currently feels well and has been exercising without episodes of atrial fibrillation.  He has agreed to proceed with the ablation.  He is already on Xarelto .  Risks and benefits have been discussed.  He understands the risks and has agreed to the procedure.  Risk, benefits, and alternatives to EP study and radiofrequency/pulse field ablation for afib were also discussed in detail today. These risks include but are not limited to stroke, bleeding, vascular damage, tamponade, perforation, damage to the esophagus, lungs, and other structures, pulmonary vein stenosis, worsening renal function, and death. The patient understands these risk and wishes to proceed.  We Johnathan [name] therefore proceed with catheter ablation at the next available time.  Carto, ICE, anesthesia are requested for the procedure.  This patient Johnathan [name] NOT require CT prior to ablation  2.  High risk medication monitoring: On dofetilide .  QTc stable.  3.  Obstructive sleep apnea: CPAP compliance encouraged  Follow up with EP Team as usual post procedure  Signed, Munira Polson Gladis Norton, MD

## 2024-11-07 ENCOUNTER — Other Ambulatory Visit: Payer: Self-pay

## 2024-11-07 ENCOUNTER — Encounter: Payer: Self-pay | Admitting: Cardiology

## 2024-11-07 ENCOUNTER — Ambulatory Visit: Attending: Cardiology | Admitting: Cardiology

## 2024-11-07 VITALS — BP 136/75 | HR 74 | Ht 73.0 in | Wt 237.0 lb

## 2024-11-07 DIAGNOSIS — Z79899 Other long term (current) drug therapy: Secondary | ICD-10-CM | POA: Diagnosis not present

## 2024-11-07 DIAGNOSIS — I4819 Other persistent atrial fibrillation: Secondary | ICD-10-CM | POA: Diagnosis not present

## 2024-11-07 DIAGNOSIS — I483 Typical atrial flutter: Secondary | ICD-10-CM

## 2024-11-07 DIAGNOSIS — Z5181 Encounter for therapeutic drug level monitoring: Secondary | ICD-10-CM | POA: Diagnosis not present

## 2024-11-07 NOTE — Patient Instructions (Signed)
 Medication Instructions:  Your physician recommends that you continue on your current medications as directed. Please refer to the Current Medication list given to you today.  *If you need a refill on your cardiac medications before your next appointment, please call your pharmacy*  Lab Work: TODAY; BMET and CBC  Testing/Procedures: Ablation - see instruction letter  Follow-Up: At [facility], you and your health needs are our priority.  As part of our continuing mission to provide you with exceptional heart care, our providers are all part of one team.  This team includes your primary Cardiologist (physician) and Advanced Practice Providers or APPs (Physician Assistants and Nurse Practitioners) who all work together to provide you with the care you need, when you need it.  Your next appointment:   We will contact you to schedule your post-procedure appointments

## 2024-11-11 LAB — CBC
Hematocrit: 47.5 % (ref 37.5–51.0)
Hemoglobin: 15.5 g/dL (ref 13.0–17.7)
MCH: 30.8 pg (ref 26.6–33.0)
MCHC: 32.6 g/dL (ref 31.5–35.7)
MCV: 94 fL (ref 79–97)
Platelets: 268 x10E3/uL (ref 150–450)
RBC: 5.04 x10E6/uL (ref 4.14–5.80)
RDW: 12.9 % (ref 11.6–15.4)
WBC: 7.6 x10E3/uL (ref 3.4–10.8)

## 2024-11-11 LAB — BASIC METABOLIC PANEL WITH GFR
BUN/Creatinine Ratio: 17 (ref 10–24)
BUN: 17 mg/dL (ref 8–27)
CO2: 27 mmol/L (ref 20–29)
Calcium: 9.5 mg/dL (ref 8.6–10.2)
Chloride: 101 mmol/L (ref 96–106)
Creatinine, Ser: 0.98 mg/dL (ref 0.76–1.27)
Glucose: 86 mg/dL (ref 70–99)
Potassium: 4.7 mmol/L (ref 3.5–5.2)
Sodium: 140 mmol/L (ref 134–144)
eGFR: 84 mL/min/1.73 (ref >59)

## 2024-11-13 ENCOUNTER — Other Ambulatory Visit: Payer: Self-pay | Admitting: Internal Medicine

## 2024-11-14 ENCOUNTER — Encounter (HOSPITAL_COMMUNITY): Payer: Self-pay

## 2024-11-24 ENCOUNTER — Other Ambulatory Visit (INDEPENDENT_AMBULATORY_CARE_PROVIDER_SITE_OTHER): Payer: Self-pay | Admitting: Otolaryngology

## 2024-12-01 ENCOUNTER — Other Ambulatory Visit: Payer: Self-pay | Admitting: Internal Medicine

## 2024-12-02 ENCOUNTER — Telehealth: Payer: Self-pay | Admitting: Cardiology

## 2024-12-02 NOTE — Telephone Encounter (Signed)
 Patient says he is an inspire patient. He would like to confirm Dr. Inocencio is aware of this, and that this will not interfere with 12/08 atrial fibrillation ablation. Please advise.

## 2024-12-04 NOTE — Anesthesia Preprocedure Evaluation (Signed)
 Anesthesia Evaluation  Patient identified by MRN, date of birth, ID band Patient awake    Reviewed: Allergy & Precautions, NPO status , Patient's Chart, lab work & pertinent test results  History of Anesthesia Complications Negative for: history of anesthetic complications  Airway Mallampati: II  TM Distance: >3 FB Neck ROM: Full    Dental no notable dental hx. (+) Teeth Intact, Dental Advisory Given   Pulmonary sleep apnea  Pt just got inspire so doesn't  use CPAP   Pulmonary exam normal breath sounds clear to auscultation       Cardiovascular (-) angina + CAD  (-) Past MI Normal cardiovascular exam+ dysrhythmias (on xarelto ) Atrial Fibrillation  Rhythm:Irregular Rate:Abnormal  03/03/2024 TEE 1. Left ventricular ejection fraction, by estimation, is 50 to 55%. The  left ventricle has low normal function.   2. Right ventricular systolic function is normal. The right ventricular  size is mildly enlarged.   3. No atrial kick on mitral TDI. No left atrial/left atrial appendage  thrombus was detected. The LAA emptying velocity was 60 cm/s.   4. The mitral valve is normal in structure. Trivial mitral valve  regurgitation. No evidence of mitral stenosis.   5. The aortic valve is tricuspid. Aortic valve regurgitation is trivial.  No aortic stenosis is present.   6. The inferior vena cava is normal in size with greater than 50%  respiratory variability, suggesting right atrial pressure of 3 mmHg.   7. NA and demonstrates No 3D acquistioin.      Neuro/Psych   Anxiety     negative neurological ROS     GI/Hepatic ,neg GERD  ,,  Endo/Other  neg diabetes    Renal/GU Lab Results      Component                Value               Date                       K                        4.7                 11/10/2024                CO2                      27                  11/10/2024                BUN                      17                   11/10/2024                CREATININE               0.98                11/10/2024                    Musculoskeletal  (+) Arthritis ,    Abdominal   Peds  Hematology Lab Results      Component  Value               Date                      WBC                      7.6                 11/10/2024                HGB                      15.5                11/10/2024                HCT                      47.5                11/10/2024                MCV                      94                  11/10/2024                PLT                      268                 11/10/2024              Anesthesia Other Findings Joo:dloqj  Reproductive/Obstetrics                              Anesthesia Physical Anesthesia Plan  ASA: 2  Anesthesia Plan: General   Post-op Pain Management: Minimal or no pain anticipated   Induction: Intravenous  PONV Risk Score and Plan: Treatment may vary due to age or medical condition, Midazolam  and Ondansetron   Airway Management Planned: Oral ETT  Additional Equipment: None  Intra-op Plan:   Post-operative Plan: Extubation in OR  Informed Consent: I have reviewed the patients History and Physical, chart, labs and discussed the procedure including the risks, benefits and alternatives for the proposed anesthesia with the patient or authorized representative who has indicated his/her understanding and acceptance.     Dental advisory given  Plan Discussed with: CRNA and Surgeon  Anesthesia Plan Comments:          Anesthesia Quick Evaluation

## 2024-12-04 NOTE — Pre-Procedure Instructions (Signed)
 Instructed patient on the following items: Arrival time 0515 Nothing to eat or drink after midnight No meds AM of procedure Responsible person to drive you home and stay with you for 24 hrs  Have you missed any doses of anti-coagulant Xarelto - takes once a day, hasn't missed any doses in last 4 weeks.

## 2024-12-05 ENCOUNTER — Ambulatory Visit (HOSPITAL_COMMUNITY)
Admission: RE | Admit: 2024-12-05 | Discharge: 2024-12-05 | Disposition: A | Attending: Cardiology | Admitting: Cardiology

## 2024-12-05 ENCOUNTER — Ambulatory Visit (HOSPITAL_COMMUNITY): Payer: Self-pay | Admitting: Certified Registered Nurse Anesthetist

## 2024-12-05 ENCOUNTER — Other Ambulatory Visit: Payer: Self-pay

## 2024-12-05 ENCOUNTER — Ambulatory Visit (HOSPITAL_COMMUNITY)
Admission: RE | Disposition: A | Discharge status: Home / Self Care | Payer: Self-pay | Source: Home / Self Care | Attending: Cardiology

## 2024-12-05 ENCOUNTER — Encounter (HOSPITAL_COMMUNITY): Payer: Self-pay | Admitting: Cardiology

## 2024-12-05 DIAGNOSIS — I4819 Other persistent atrial fibrillation: Secondary | ICD-10-CM | POA: Insufficient documentation

## 2024-12-05 DIAGNOSIS — E785 Hyperlipidemia, unspecified: Secondary | ICD-10-CM | POA: Insufficient documentation

## 2024-12-05 DIAGNOSIS — Z7901 Long term (current) use of anticoagulants: Secondary | ICD-10-CM | POA: Insufficient documentation

## 2024-12-05 DIAGNOSIS — I483 Typical atrial flutter: Secondary | ICD-10-CM | POA: Insufficient documentation

## 2024-12-05 DIAGNOSIS — Z79899 Other long term (current) drug therapy: Secondary | ICD-10-CM | POA: Insufficient documentation

## 2024-12-05 DIAGNOSIS — G4733 Obstructive sleep apnea (adult) (pediatric): Secondary | ICD-10-CM | POA: Insufficient documentation

## 2024-12-05 HISTORY — PX: ATRIAL FIBRILLATION ABLATION: EP1191

## 2024-12-05 LAB — POCT ACTIVATED CLOTTING TIME
Activated Clotting Time: 322 s
Activated Clotting Time: 327 s

## 2024-12-05 SURGERY — ATRIAL FIBRILLATION ABLATION
Anesthesia: General

## 2024-12-05 MED ORDER — HEPARIN SODIUM (PORCINE) 1000 UNIT/ML IJ SOLN
INTRAMUSCULAR | Status: DC | PRN
  Administered 2024-12-05: 15000 [IU] via INTRAVENOUS
  Administered 2024-12-05: 4000 [IU] via INTRAVENOUS

## 2024-12-05 MED ORDER — HEPARIN (PORCINE) IN NACL 1000-0.9 UT/500ML-% IV SOLN
INTRAVENOUS | Status: DC | PRN
  Administered 2024-12-05 (×3): 500 mL

## 2024-12-05 MED ORDER — SODIUM CHLORIDE 0.9% FLUSH: 3.0000 mL | INTRAVENOUS | Status: DC | PRN

## 2024-12-05 MED ORDER — SODIUM CHLORIDE 0.9 % IV SOLN: 250.0000 mL | INTRAVENOUS | Status: DC | PRN

## 2024-12-05 MED ORDER — PROTAMINE SULFATE 10 MG/ML IV SOLN
INTRAVENOUS | Status: DC | PRN
  Administered 2024-12-05: 30 mg via INTRAVENOUS
  Administered 2024-12-05: 10 mg via INTRAVENOUS

## 2024-12-05 MED ORDER — LIDOCAINE 2% (20 MG/ML) 5 ML SYRINGE
INTRAMUSCULAR | Status: DC | PRN
  Administered 2024-12-05: 100 mg via INTRAVENOUS

## 2024-12-05 MED ORDER — ONDANSETRON HCL 4 MG/2ML IJ SOLN: 4.0000 mg | Freq: Four times a day (QID) | INTRAMUSCULAR | Status: DC | PRN

## 2024-12-05 MED ORDER — FENTANYL CITRATE (PF) 100 MCG/2ML IJ SOLN
INTRAMUSCULAR | Status: AC
  Filled 2024-12-05: qty 2

## 2024-12-05 MED ORDER — PHENYLEPHRINE HCL-NACL 20-0.9 MG/250ML-% IV SOLN
INTRAVENOUS | Status: DC | PRN
  Administered 2024-12-05: 40 ug/min via INTRAVENOUS

## 2024-12-05 MED ORDER — ONDANSETRON HCL 4 MG/2ML IJ SOLN
INTRAMUSCULAR | Status: DC | PRN
  Administered 2024-12-05: 4 mg via INTRAVENOUS

## 2024-12-05 MED ORDER — FENTANYL CITRATE (PF) 250 MCG/5ML IJ SOLN
INTRAMUSCULAR | Status: DC | PRN
  Administered 2024-12-05: 100 ug via INTRAVENOUS

## 2024-12-05 MED ORDER — SUGAMMADEX SODIUM 200 MG/2ML IV SOLN
INTRAVENOUS | Status: DC | PRN
  Administered 2024-12-05: 400 mg via INTRAVENOUS

## 2024-12-05 MED ORDER — ROCURONIUM BROMIDE 10 MG/ML (PF) SYRINGE
PREFILLED_SYRINGE | INTRAVENOUS | Status: DC | PRN
  Administered 2024-12-05: 30 mg via INTRAVENOUS
  Administered 2024-12-05: 60 mg via INTRAVENOUS

## 2024-12-05 MED ORDER — HEPARIN SODIUM (PORCINE) 1000 UNIT/ML IJ SOLN
INTRAMUSCULAR | Status: DC | PRN
  Administered 2024-12-05: 1000 [IU] via INTRAVENOUS

## 2024-12-05 MED ORDER — ACETAMINOPHEN 325 MG PO TABS: 650.0000 mg | ORAL_TABLET | ORAL | Status: DC | PRN

## 2024-12-05 MED ORDER — ATROPINE SULFATE 1 MG/10ML IJ SOSY
PREFILLED_SYRINGE | INTRAMUSCULAR | Status: DC | PRN
  Administered 2024-12-05: 1 mg via INTRAVENOUS

## 2024-12-05 MED ORDER — DEXAMETHASONE SOD PHOSPHATE PF 10 MG/ML IJ SOLN
INTRAMUSCULAR | Status: DC | PRN
  Administered 2024-12-05: 10 mg via INTRAVENOUS

## 2024-12-05 MED ORDER — SODIUM CHLORIDE 0.9% FLUSH: 3.0000 mL | Freq: Two times a day (BID) | INTRAVENOUS | Status: DC

## 2024-12-05 MED ORDER — HEPARIN SODIUM (PORCINE) 1000 UNIT/ML IJ SOLN
INTRAMUSCULAR | Status: AC
  Filled 2024-12-05: qty 40

## 2024-12-05 MED ORDER — SODIUM CHLORIDE 0.9 % IV SOLN: INTRAVENOUS | Status: DC

## 2024-12-05 MED ORDER — EPHEDRINE SULFATE-NACL 50-0.9 MG/10ML-% IV SOSY
PREFILLED_SYRINGE | INTRAVENOUS | Status: DC | PRN
  Administered 2024-12-05: 10 mg via INTRAVENOUS

## 2024-12-05 MED ORDER — PROPOFOL 10 MG/ML IV BOLUS
INTRAVENOUS | Status: DC | PRN
  Administered 2024-12-05: 160 mg via INTRAVENOUS

## 2024-12-05 MED ORDER — HEPARIN SODIUM (PORCINE) 1000 UNIT/ML IJ SOLN
INTRAMUSCULAR | Status: AC
  Filled 2024-12-05: qty 10

## 2024-12-05 SURGICAL SUPPLY — 18 items
BLANKET WARM UNDERBOD FULL ACC (MISCELLANEOUS) ×1 IMPLANT
CABLE VARIPULSE STERILE (CATHETERS) IMPLANT
CATH EZ STEER NAV 8MM D-F CUR (ABLATOR) IMPLANT
CATH GE 8FR SOUNDSTAR (CATHETERS) IMPLANT
CATH WEBSTER BI DIR CS D-F CRV (CATHETERS) IMPLANT
CATHETER VARIPULSE 8.5FR (CATHETERS) IMPLANT
CLOSURE MYNX CONTROL 6F/7F (Vascular Products) IMPLANT
CLOSURE PERCLOSE PROSTYLE (Vascular Products) IMPLANT
COVER SWIFTLINK CONNECTOR (BAG) ×1 IMPLANT
KIT VERSACROSS 8.5F 63 45D 180 (KITS) IMPLANT
PACK EP LF (CUSTOM PROCEDURE TRAY) ×1 IMPLANT
PAD DEFIB RADIO PHYSIO CONN (PAD) ×1 IMPLANT
PATCH CARTO3 (PAD) IMPLANT
SHEATH CARTO VIZIGO SM CVD (SHEATH) IMPLANT
SHEATH PINNACLE 8F 10CM (SHEATH) IMPLANT
SHEATH PINNACLE 9F 10CM (SHEATH) IMPLANT
SHEATH PROBE COVER 6X72 (BAG) IMPLANT
TUBING SMART ABLATE COOLFLOW (TUBING) IMPLANT

## 2024-12-05 NOTE — Transfer of Care (Signed)
 Immediate Anesthesia Transfer of Care Note  Patient: Johnathan [name]  Procedure(s) Performed: ATRIAL FIBRILLATION ABLATION  Patient Location: PACU and Cath Lab  Anesthesia Type:General  Level of Consciousness: awake, alert , and oriented  Airway & Oxygen Therapy: Patient Spontanous Breathing and Patient connected to face mask oxygen  Post-op Assessment: Report given to RN and Post -op Vital signs reviewed and stable  Post vital signs: Reviewed and stable  Last Vitals:  Vitals Value Taken Time  BP    Temp    Pulse 68 12/05/24 09:07  Resp 18 12/05/24 09:07  SpO2 100 % 12/05/24 09:07  Vitals shown include unfiled device data.  Last Pain:  Vitals:   12/05/24 0545  TempSrc:   PainSc: 0-No pain         Complications: There were no known notable events for this encounter.

## 2024-12-05 NOTE — Discharge Instructions (Signed)

## 2024-12-05 NOTE — Anesthesia Postprocedure Evaluation (Signed)
 Anesthesia Post Note  Patient: Johnathan [name]  Procedure(s) Performed: ATRIAL FIBRILLATION ABLATION     Patient location during evaluation: PACU Anesthesia Type: General Level of consciousness: awake and alert Pain management: pain level controlled Vital Signs Assessment: post-procedure vital signs reviewed and stable Respiratory status: spontaneous breathing, nonlabored ventilation, respiratory function stable and patient connected to nasal cannula oxygen Cardiovascular status: blood pressure returned to baseline and stable Postop Assessment: no apparent nausea or vomiting Anesthetic complications: no   There were no known notable events for this encounter.  Last Vitals:  Vitals:   12/05/24 1015 12/05/24 1030  BP: 127/81 124/82  Pulse: 63 68  Resp: 19 13  Temp:    SpO2: 92% 92%    Last Pain:  Vitals:   12/05/24 0935  TempSrc: Oral  PainSc:                  Garnette DELENA Gab

## 2024-12-05 NOTE — Progress Notes (Addendum)
 Dr Inocencio in to see client and ok to d/c home; client up and walked and tolerated well; bilat groins stable, no bleeding or hematoma; client voided without difficulty

## 2024-12-05 NOTE — Interval H&P Note (Signed)
 History and Physical Interval Note:  12/05/2024 7:05 AM  Johnathan [name]  has presented today for surgery, with the diagnosis of afib.  The various methods of treatment have been discussed with the patient and family. After consideration of risks, benefits and other options for treatment, the patient has consented to  Procedure(s): ATRIAL FIBRILLATION ABLATION (N/A) as a surgical intervention.  The patient's history has been reviewed, patient examined, no change in status, stable for surgery.  I have reviewed the patient's chart and labs.  Questions were answered to the patient's satisfaction.     Xanthe Couillard [employer]

## 2024-12-05 NOTE — Anesthesia Procedure Notes (Signed)
 Procedure Name: Intubation Date/Time: 12/05/2024 7:42 AM  Performed by: Mannie Krystal LABOR, CRNAPre-anesthesia Checklist: Patient identified, Emergency Drugs available, Suction available and Patient being monitored Patient Re-evaluated:Patient Re-evaluated prior to induction Oxygen Delivery Method: Circle system utilized Preoxygenation: Pre-oxygenation with 100% oxygen Induction Type: IV induction Ventilation: Two handed mask ventilation required Laryngoscope Size: Mac and 4 Grade View: Grade I Tube type: Oral Tube size: 7.0 mm Number of attempts: 1 Airway Equipment and Method: Stylet Placement Confirmation: ETT inserted through vocal cords under direct vision, positive ETCO2 and breath sounds checked- equal and bilateral Secured at: 22 cm Tube secured with: Tape Dental Injury: Teeth and Oropharynx as per pre-operative assessment

## 2024-12-06 ENCOUNTER — Encounter: Payer: Self-pay | Admitting: Emergency Medicine

## 2024-12-06 ENCOUNTER — Telehealth: Payer: Self-pay | Admitting: Student

## 2024-12-06 ENCOUNTER — Telehealth (HOSPITAL_COMMUNITY): Payer: Self-pay

## 2024-12-06 NOTE — Telephone Encounter (Signed)
   Patient called after-hours Answering Service. Called and spoke with patient. He had an atrial fibrillation ablation yesterday and was wondering if he could take the bandages in his groin off to shower. Confirmed with EP APP that this is okay given it has been 24 hours since his procedure. Okay for patient to shower but advised patient not scrub the area and not to submerge the area in any sitting water (no baths).  Jalexus Brett E Dwanna Goshert, PA-C 12/06/2024 5:36 PM

## 2024-12-06 NOTE — Telephone Encounter (Signed)
 Attempted to reach patient to follow up with procedure completed on 12/05/24, no answer. Left VM for patient to return call.

## 2024-12-07 MED FILL — Fentanyl Citrate Preservative Free (PF) Inj 100 MCG/2ML: INTRAMUSCULAR | Qty: 2 | Status: AC

## 2024-12-25 ENCOUNTER — Other Ambulatory Visit: Payer: Self-pay | Admitting: Internal Medicine

## 2025-01-03 ENCOUNTER — Ambulatory Visit (HOSPITAL_COMMUNITY)
Admit: 2025-01-03 | Discharge: 2025-01-03 | Disposition: A | Discharge status: Home / Self Care | Attending: Internal Medicine | Admitting: Internal Medicine

## 2025-01-03 ENCOUNTER — Encounter (HOSPITAL_COMMUNITY): Payer: Self-pay | Admitting: Internal Medicine

## 2025-01-03 VITALS — BP 146/82 | HR 74 | Ht 73.0 in | Wt 236.7 lb

## 2025-01-03 DIAGNOSIS — Z5181 Encounter for therapeutic drug level monitoring: Secondary | ICD-10-CM | POA: Diagnosis not present

## 2025-01-03 DIAGNOSIS — I4819 Other persistent atrial fibrillation: Secondary | ICD-10-CM | POA: Diagnosis not present

## 2025-01-03 DIAGNOSIS — Z79899 Other long term (current) drug therapy: Secondary | ICD-10-CM | POA: Diagnosis not present

## 2025-01-03 DIAGNOSIS — D6869 Other thrombophilia: Secondary | ICD-10-CM

## 2025-01-03 NOTE — Progress Notes (Signed)
 "   Primary Care Physician: Rollene Almarie LABOR, MD Primary Cardiologist: Dorn Lesches, MD Electrophysiologist: Will Gladis Norton, MD     Referring Physician: Delon Holts, PA-C     Johnathan [name] is a 69 y.o. male with a history of OSA on CPAP, HLD, and paroxysmal atrial fibrillation who presents for consultation in the [facility]. Seen by Cardiology on 03/02/24 and noted to be in atrial flutter. S/p successful DCCV on 03/03/24. Seen by Dr. Lesches yesterday 4/21 and noted to be in sinus tachycardia. Patient is on Xarelto  20 mg for a CHADS2VASC score of 1.  On follow up 01/03/25, patient is currently in NSR. S/p Afib/flutter ablation on 12/05/24 by Dr. Norton. He has felt some palpitations but they were brief and resolved with sitting down. He is taking Tikosyn  500 mcg BID. No chest pain or SOB. Leg sites healed without issue. No missed doses of anticoagulant.  Today, he denies symptoms of orthopnea, PND, lower extremity edema, dizziness, presyncope, syncope, snoring, daytime somnolence, bleeding, or neurologic sequela. The patient is tolerating medications without difficulties and is otherwise without complaint today.    Atrial Fibrillation Risk Factors:  he does have symptoms or diagnosis of sleep apnea. he is compliant with CPAP therapy.   he has a BMI of Body mass index is 31.23 kg/m.SABRA Filed Weights   01/03/25 1436  Weight: 107.4 kg     Current Outpatient Medications  Medication Sig Dispense Refill   allopurinol  (ZYLOPRIM ) 300 MG tablet TAKE 1 TABLET BY MOUTH EVERY DAY (Patient taking differently: Take 150 mg by mouth 2 (two) times daily.) 90 tablet 3   atorvastatin  (LIPITOR) 80 MG tablet Take 1 tablet (80 mg total) by mouth daily. 90 tablet 3   colchicine  0.6 MG tablet TAKE 1 TABLET WITH ONSET OF GOUT, REPEAT 1 TABLET IN 3 HOURS.THEN 1 TABLET DAILY UNTIL RESOLVED. 30 tablet 11   diltiazem  (CARDIZEM ) 30 MG tablet Take 1 Tablet Every 4 Hours As  Needed For HR >100 and Top BP >100 45 tablet 1   dofetilide  (TIKOSYN ) 500 MCG capsule TAKE 1 CAPSULE BY MOUTH 2 TIMES DAILY. 180 capsule 2   escitalopram  (LEXAPRO ) 5 MG tablet TAKE 1 TABLET (5 MG TOTAL) BY MOUTH DAILY. 90 tablet 1   eszopiclone  (LUNESTA ) 2 MG TABS tablet TAKE 1 TABLET BY MOUTH AT BEDTIME AS NEEDED FOR SLEEP 30 tablet 2   ezetimibe  (ZETIA ) 10 MG tablet Take 1 tablet (10 mg total) by mouth daily. (Patient taking differently: Take 10 mg by mouth at bedtime.) 90 tablet 3   rivaroxaban  (XARELTO ) 20 MG TABS tablet Take 1 tablet (20 mg total) by mouth daily with supper. 30 tablet 11   No current facility-administered medications for this encounter.    Atrial Fibrillation Management history:  Previous antiarrhythmic drugs: Tikosyn  Previous cardioversions: multiple remotely, 03/03/24 Previous ablations: ~2016 [facility] Anticoagulation history: Xarelto    ROS- All systems are reviewed and negative except as per the HPI above.  Physical Exam: BP (!) 146/82   Pulse 74   Ht 6' 1 (1.854 m)   Wt 107.4 kg   BMI 31.23 kg/m   GEN- The patient is well appearing, alert and oriented x 3 today.   Neck - no JVD or carotid bruit noted Lungs- Clear to ausculation bilaterally, normal work of breathing Heart- Regular rate and rhythm, no murmurs, rubs or gallops, PMI not laterally displaced Extremities- no clubbing, cyanosis, or edema Skin - no rash or ecchymosis noted  EKG today demonstrates  EKG Interpretation Date/Time:  Tuesday January 03 2025 14:39:13 EST Ventricular Rate:  74 PR Interval:  166 QRS Duration:  94 QT Interval:  384 QTC Calculation: 426 R Axis:   4  Text Interpretation: Normal sinus rhythm Normal ECG When compared with ECG of 05-Dec-2024 09:19, PREVIOUS ECG IS PRESENT Confirmed by Terra Pac ([id number]) on 01/03/2025 3:02:11 PM    Echo 03/03/24 (TEE) demonstrated  1. Left ventricular ejection fraction, by estimation, is 50 to 55%. The  left ventricle has low normal  function.   2. Right ventricular systolic function is normal. The right ventricular  size is mildly enlarged.   3. No atrial kick on mitral TDI. No left atrial/left atrial appendage  thrombus was detected. The LAA emptying velocity was 60 cm/s.   4. The mitral valve is normal in structure. Trivial mitral valve  regurgitation. No evidence of mitral stenosis.   5. The aortic valve is tricuspid. Aortic valve regurgitation is trivial.  No aortic stenosis is present.   6. The inferior vena cava is normal in size with greater than 50%  respiratory variability, suggesting right atrial pressure of 3 mmHg.   7. NA and demonstrates No 3D acquistioin.   ASSESSMENT & PLAN CHA2DS2-VASc Score = 1  The patient's score is based upon: CHF History: 0 HTN History: 0 Diabetes History: 0 Stroke History: 0 Vascular Disease History: 0 Age Score: 1 Gender Score: 0       ASSESSMENT AND PLAN: Persistent Atrial Fibrillation (ICD10:  I48.19) / atrial flutter The patient's CHA2DS2-VASc score is 1, indicating a 0.6% annual risk of stroke.   S/p successful DCCV on 03/03/24. S/p A-fib/flutter ablation on 12/05/2024 by Dr. Inocencio.  Patient is currently in NSR.  We discussed what to expect during the recovery period following ablation.  Continue Xarelto  20 mg daily without interruption in the blanking period.  High risk medication monitoring (ICD10: J342684) Patient requires ongoing monitoring for anti-arrhythmic medication which has the potential to cause life threatening arrhythmias or AV block. Qtc stable. Continue Tikosyn  500 mcg twice daily.    Follow up with Afib clinic as scheduled.   Terra Pac, PA-C  Afib Clinic [facility] [address] [place], [place] [address] [phone]  "

## 2025-01-06 ENCOUNTER — Telehealth: Payer: Self-pay | Admitting: Cardiology

## 2025-01-06 NOTE — Telephone Encounter (Signed)
 Spoke with the patient who states that his heart rate has been elevated at 104-106 all day. He states that he thinks it was probably elevated most of the night. He states that he is feeling fine. Denies any chest pain, shortness of breath or weakness. He states that he is monitoring his heart rate with his watch. He is taking Tikosyn  and Xarelto  as prescribed. He has diltiazem  30 mg to take as needed but has not taken any yet. He is currently at work but will take it when he gets back home. Advised he can take this every 4 hours. Patient verbalized understanding and agrees with the plan.

## 2025-01-06 NOTE — Telephone Encounter (Signed)
 Patient c/o Palpitations: STAT if patient c/o lightheadedness, shortness of breath, or chest pain  How long have you had palpitations/irregular HR/ Afib? Are you having the symptoms now? Yes, states that he can barely feel it but it is there.  Are you currently experiencing lightheadedness, SOB or CP? no  Do you have a history of afib (atrial fibrillation) or irregular heart rhythm? yes  Have you checked your BP or HR? (document readings if available): HR: 104, 106  Are you experiencing any other symptoms? none

## 2025-01-09 ENCOUNTER — Encounter: Payer: Self-pay | Admitting: Cardiology

## 2025-01-09 NOTE — Telephone Encounter (Signed)
 Pt reporting elevated HRs yesterday and today.   Started to occur about 10:30 pm last night -- he has taken PRN Diltiazem , last night around 11 pm, this morning around 2/3 am and again around 8:30 am.  His apple watch is not indicating afib, just elevated HRs into the low 100s (states HRs usually avg 50-80s)  He did have one beer yesterday afternoon. Asymptomatic.  Aware the one beer may have been  a trigger.  Aware that as long as he remains asymptomatic and this resolves soon I am not sure we would change anything, but monitor for now instead. Pt advised to call back if symptoms arise and/or the elevated HRs persist for several more days. Will send to provider and afib clinic for recommendations, if any.  Patient verbalized understanding and agreeable to plan.

## 2025-01-09 NOTE — Telephone Encounter (Signed)
 STAT if HR is under 50 or over 120 (normal HR is 60-100 beats per minute)  What is your heart rate? 98 - At time of call  Do you have a log of your heart rate readings (document readings)?  [phone] - This morning  Do you have any other symptoms? No

## 2025-01-10 ENCOUNTER — Ambulatory Visit (HOSPITAL_COMMUNITY)
Admission: RE | Admit: 2025-01-10 | Discharge: 2025-01-10 | Disposition: A | Discharge status: Home / Self Care | Source: Ambulatory Visit | Attending: Internal Medicine | Admitting: Internal Medicine

## 2025-01-10 VITALS — BP 134/86 | HR 109 | Ht 73.0 in | Wt 231.0 lb

## 2025-01-10 DIAGNOSIS — Z79899 Other long term (current) drug therapy: Secondary | ICD-10-CM | POA: Diagnosis not present

## 2025-01-10 DIAGNOSIS — I4819 Other persistent atrial fibrillation: Secondary | ICD-10-CM

## 2025-01-10 DIAGNOSIS — I484 Atypical atrial flutter: Secondary | ICD-10-CM

## 2025-01-10 DIAGNOSIS — I4891 Unspecified atrial fibrillation: Secondary | ICD-10-CM | POA: Diagnosis not present

## 2025-01-10 DIAGNOSIS — D6869 Other thrombophilia: Secondary | ICD-10-CM

## 2025-01-10 DIAGNOSIS — Z5181 Encounter for therapeutic drug level monitoring: Secondary | ICD-10-CM | POA: Diagnosis not present

## 2025-01-10 LAB — BASIC METABOLIC PANEL WITH GFR
BUN/Creatinine Ratio: 17 (ref 10–24)
BUN: 16 mg/dL (ref 8–27)
CO2: 28 mmol/L (ref 20–29)
Calcium: 9.8 mg/dL (ref 8.6–10.2)
Chloride: 98 mmol/L (ref 96–106)
Creatinine, Ser: 0.95 mg/dL (ref 0.76–1.27)
Glucose: 104 mg/dL — ABNORMAL HIGH (ref 70–99)
Potassium: 4.6 mmol/L (ref 3.5–5.2)
Sodium: 133 mmol/L — ABNORMAL LOW (ref 134–144)
eGFR: 87 mL/min/1.73

## 2025-01-10 LAB — CBC
Hematocrit: 46.9 % (ref 37.5–51.0)
Hemoglobin: 15.7 g/dL (ref 13.0–17.7)
MCH: 30.2 pg (ref 26.6–33.0)
MCHC: 33.5 g/dL (ref 31.5–35.7)
MCV: 90 fL (ref 79–97)
Platelets: 275 x10E3/uL (ref 150–450)
RBC: 5.2 x10E6/uL (ref 4.14–5.80)
RDW: 14.3 % (ref 11.6–15.4)
WBC: 9.5 x10E3/uL (ref 3.4–10.8)

## 2025-01-10 LAB — MAGNESIUM: Magnesium: 1.8 mg/dL (ref 1.6–2.3)

## 2025-01-10 NOTE — Progress Notes (Signed)
 "   Primary Care Physician: Rollene Almarie LABOR, MD Primary Cardiologist: Dorn Lesches, MD Electrophysiologist: Will Gladis Norton, MD  Referring Physician: Delon Holts, PA-C     Johnathan [name] is a 69 y.o. male with a history of OSA on CPAP, HLD, and paroxysmal atrial fibrillation who presents for follow up in the [facility]. Seen by Cardiology on 03/02/24 and noted to be in atrial flutter. S/p successful DCCV on 03/03/24. Seen by Dr. Lesches yesterday 4/21 and noted to be in sinus tachycardia. Patient is on Xarelto  for stroke prevention.  S/p Afib/flutter ablation on 12/05/24 by Dr. Norton.   Patient returns for follow up for atrial fibrillation and dofetilide  monitoring. At his visit 01/03/25 he was feeling well and maintaining SR. He reports on Sunday night that he felt tachypalpitations. His heart rates have been 100-110's since that time. He is in atrial flutter today. There were no specific triggers that he could identify. He has taken PRN diltiazem  without much improvement in his heart rates.   Today, he  denies symptoms of chest pain, shortness of breath, orthopnea, PND, lower extremity edema, dizziness, presyncope, syncope, bleeding, or neurologic sequela. The patient is tolerating medications without difficulties and is otherwise without complaint today.    Atrial Fibrillation Risk Factors:  he does have symptoms or diagnosis of sleep apnea. he is compliant with CPAP therapy.   he has a BMI of Body mass index is 30.48 kg/m.SABRA Filed Weights   01/10/25 1400  Weight: 104.8 kg    Current Outpatient Medications  Medication Sig Dispense Refill   allopurinol  (ZYLOPRIM ) 300 MG tablet TAKE 1 TABLET BY MOUTH EVERY DAY (Patient taking differently: Take 150 mg by mouth 2 (two) times daily.) 90 tablet 3   atorvastatin  (LIPITOR) 80 MG tablet Take 1 tablet (80 mg total) by mouth daily. 90 tablet 3   colchicine  0.6 MG tablet TAKE 1 TABLET WITH ONSET OF GOUT,  REPEAT 1 TABLET IN 3 HOURS.THEN 1 TABLET DAILY UNTIL RESOLVED. 30 tablet 11   diltiazem  (CARDIZEM ) 30 MG tablet Take 1 Tablet Every 4 Hours As Needed For HR >100 and Top BP >100 45 tablet 1   dofetilide  (TIKOSYN ) 500 MCG capsule TAKE 1 CAPSULE BY MOUTH 2 TIMES DAILY. 180 capsule 2   escitalopram  (LEXAPRO ) 5 MG tablet TAKE 1 TABLET (5 MG TOTAL) BY MOUTH DAILY. 90 tablet 1   eszopiclone  (LUNESTA ) 2 MG TABS tablet TAKE 1 TABLET BY MOUTH AT BEDTIME AS NEEDED FOR SLEEP 30 tablet 2   ezetimibe  (ZETIA ) 10 MG tablet Take 1 tablet (10 mg total) by mouth daily. 90 tablet 3   rivaroxaban  (XARELTO ) 20 MG TABS tablet Take 1 tablet (20 mg total) by mouth daily with supper. 30 tablet 11   No current facility-administered medications for this encounter.    Atrial Fibrillation Management history:  Previous antiarrhythmic drugs: Tikosyn  Previous cardioversions: multiple remotely, 03/03/24 Previous ablations: ~2016 [facility], 12/05/24 Anticoagulation history: Xarelto    ROS- All systems are reviewed and negative except as per the HPI above.  Physical Exam: BP 134/86   Pulse (!) 109   Ht 6' 1 (1.854 m)   Wt 104.8 kg   BMI 30.48 kg/m    GEN: Well nourished, well developed in no acute distress CARDIAC: Regular rate and rhythm, no murmurs, rubs, gallops RESPIRATORY:  Clear to auscultation without rales, wheezing or rhonchi  ABDOMEN: Soft, non-tender, non-distended EXTREMITIES:  No edema; No deformity     EKG Interpretation Date/Time:  Tuesday  January 10 2025 14:07:20 EST Ventricular Rate:  109 PR Interval:    QRS Duration:  96 QT Interval:  336 QTC Calculation: 452 R Axis:   -89  Text Interpretation: Atrial flutter with 2 to 1 block Pulmonary disease pattern Left anterior fascicular block Minimal voltage criteria for LVH, may be normal variant ( Cornell product ) Abnormal ECG When compared with ECG of 03-Jan-2025 14:39, Atrial flutter has replaced Sinus rhythm Confirmed by Alaila Pillard ([id number]) on  01/10/2025 4:20:23 PM    Echo 03/03/24 (TEE) demonstrated  1. Left ventricular ejection fraction, by estimation, is 50 to 55%. The  left ventricle has low normal function.   2. Right ventricular systolic function is normal. The right ventricular  size is mildly enlarged.   3. No atrial kick on mitral TDI. No left atrial/left atrial appendage  thrombus was detected. The LAA emptying velocity was 60 cm/s.   4. The mitral valve is normal in structure. Trivial mitral valve  regurgitation. No evidence of mitral stenosis.   5. The aortic valve is tricuspid. Aortic valve regurgitation is trivial.  No aortic stenosis is present.   6. The inferior vena cava is normal in size with greater than 50%  respiratory variability, suggesting right atrial pressure of 3 mmHg.   7. NA and demonstrates No 3D acquistioin.    CHA2DS2-VASc Score = 1  The patient's score is based upon: CHF History: 0 HTN History: 0 Diabetes History: 0 Stroke History: 0 Vascular Disease History: 0 Age Score: 1 Gender Score: 0       ASSESSMENT AND PLAN: Persistent Atrial Fibrillation/atrial flutter (ICD10:  I48.19) The patient's CHA2DS2-VASc score is 1, indicating a 0.6% annual risk of stroke.   S/p afib and flutter ablation 12/05/24 He is back in atrial flutter with elevated rates. We discussed rhythm control options. Will plan for DCCV.  Check bmet/cbc/mag. Continue dofetilide  500 mcg BID Continue Xarelto  20 mg daily Continue diltiazem  30 mg PRN q 4 hours for heart racing.   High Risk Medication Monitoring (ICD 10: U5195107) Patient requires ongoing monitoring for anti-arrhythmic medication which has the potential to cause life threatening arrhythmias. QT interval on ECG acceptable for dofetilide  monitoring. Check bmet/mag today.     OSA  Encouraged nightly CPAP   Follow up in the AF clinic post DCCV.    Informed Consent   Shared Decision Making/Informed Consent The risks (stroke, cardiac arrhythmias rarely  resulting in the need for a temporary or permanent pacemaker, skin irritation or burns and complications associated with conscious sedation including aspiration, arrhythmia, respiratory failure and death), benefits (restoration of normal sinus rhythm) and alternatives of a direct current cardioversion were explained in detail to Mr. Burrill and he agrees to proceed.       Daril Kicks PA-C Afib Clinic [facility] [address] [place], [place] [address] [phone]  "

## 2025-01-10 NOTE — Patient Instructions (Signed)
 Cardioversion scheduled for: Wednesday, January 14th   - Arrive at the [facility] ([address]  and check in with ADMITTING at 1230pm   - Do not eat or drink anything after midnight the night prior to your procedure.   - Take all your morning medication (except diabetic medications) with a sip of water prior to arrival.  - Do NOT miss any doses of your blood thinner - if you should miss a dose or take a dose more than 4 hours late -- please notify our office immediately.  - You will not be able to drive home after your procedure. Please ensure you have a responsible adult to drive you home. You will need someone with you for 24 hours post procedure.     - Expect to be in the procedural area approximately 2 hours.   - If you feel as if you go back into normal rhythm prior to scheduled cardioversion, please notify our office immediately.   If your procedure is canceled in the cardioversion suite you will be charged a cancellation fee.

## 2025-01-10 NOTE — H&P (View-Only) (Signed)
 "   Primary Care Physician: Rollene Almarie LABOR, MD Primary Cardiologist: Dorn Lesches, MD Electrophysiologist: Will Gladis Norton, MD  Referring Physician: Delon Holts, PA-C     Johnathan [name] is a 69 y.o. male with a history of OSA on CPAP, HLD, and paroxysmal atrial fibrillation who presents for follow up in the [facility]. Seen by Cardiology on 03/02/24 and noted to be in atrial flutter. S/p successful DCCV on 03/03/24. Seen by Dr. Lesches yesterday 4/21 and noted to be in sinus tachycardia. Patient is on Xarelto  for stroke prevention.  S/p Afib/flutter ablation on 12/05/24 by Dr. Norton.   Patient returns for follow up for atrial fibrillation and dofetilide  monitoring. At his visit 01/03/25 he was feeling well and maintaining SR. He reports on Sunday night that he felt tachypalpitations. His heart rates have been 100-110's since that time. He is in atrial flutter today. There were no specific triggers that he could identify. He has taken PRN diltiazem  without much improvement in his heart rates.   Today, he  denies symptoms of chest pain, shortness of breath, orthopnea, PND, lower extremity edema, dizziness, presyncope, syncope, bleeding, or neurologic sequela. The patient is tolerating medications without difficulties and is otherwise without complaint today.    Atrial Fibrillation Risk Factors:  he does have symptoms or diagnosis of sleep apnea. he is compliant with CPAP therapy.   he has a BMI of Body mass index is 30.48 kg/m.SABRA Filed Weights   01/10/25 1400  Weight: 104.8 kg    Current Outpatient Medications  Medication Sig Dispense Refill   allopurinol  (ZYLOPRIM ) 300 MG tablet TAKE 1 TABLET BY MOUTH EVERY DAY (Patient taking differently: Take 150 mg by mouth 2 (two) times daily.) 90 tablet 3   atorvastatin  (LIPITOR) 80 MG tablet Take 1 tablet (80 mg total) by mouth daily. 90 tablet 3   colchicine  0.6 MG tablet TAKE 1 TABLET WITH ONSET OF GOUT,  REPEAT 1 TABLET IN 3 HOURS.THEN 1 TABLET DAILY UNTIL RESOLVED. 30 tablet 11   diltiazem  (CARDIZEM ) 30 MG tablet Take 1 Tablet Every 4 Hours As Needed For HR >100 and Top BP >100 45 tablet 1   dofetilide  (TIKOSYN ) 500 MCG capsule TAKE 1 CAPSULE BY MOUTH 2 TIMES DAILY. 180 capsule 2   escitalopram  (LEXAPRO ) 5 MG tablet TAKE 1 TABLET (5 MG TOTAL) BY MOUTH DAILY. 90 tablet 1   eszopiclone  (LUNESTA ) 2 MG TABS tablet TAKE 1 TABLET BY MOUTH AT BEDTIME AS NEEDED FOR SLEEP 30 tablet 2   ezetimibe  (ZETIA ) 10 MG tablet Take 1 tablet (10 mg total) by mouth daily. 90 tablet 3   rivaroxaban  (XARELTO ) 20 MG TABS tablet Take 1 tablet (20 mg total) by mouth daily with supper. 30 tablet 11   No current facility-administered medications for this encounter.    Atrial Fibrillation Management history:  Previous antiarrhythmic drugs: Tikosyn  Previous cardioversions: multiple remotely, 03/03/24 Previous ablations: ~2016 [facility], 12/05/24 Anticoagulation history: Xarelto    ROS- All systems are reviewed and negative except as per the HPI above.  Physical Exam: BP 134/86   Pulse (!) 109   Ht 6' 1 (1.854 m)   Wt 104.8 kg   BMI 30.48 kg/m    GEN: Well nourished, well developed in no acute distress CARDIAC: Regular rate and rhythm, no murmurs, rubs, gallops RESPIRATORY:  Clear to auscultation without rales, wheezing or rhonchi  ABDOMEN: Soft, non-tender, non-distended EXTREMITIES:  No edema; No deformity     EKG Interpretation Date/Time:  Tuesday  January 10 2025 14:07:20 EST Ventricular Rate:  109 PR Interval:    QRS Duration:  96 QT Interval:  336 QTC Calculation: 452 R Axis:   -89  Text Interpretation: Atrial flutter with 2 to 1 block Pulmonary disease pattern Left anterior fascicular block Minimal voltage criteria for LVH, may be normal variant ( Cornell product ) Abnormal ECG When compared with ECG of 03-Jan-2025 14:39, Atrial flutter has replaced Sinus rhythm Confirmed by Alaila Pillard ([id number]) on  01/10/2025 4:20:23 PM    Echo 03/03/24 (TEE) demonstrated  1. Left ventricular ejection fraction, by estimation, is 50 to 55%. The  left ventricle has low normal function.   2. Right ventricular systolic function is normal. The right ventricular  size is mildly enlarged.   3. No atrial kick on mitral TDI. No left atrial/left atrial appendage  thrombus was detected. The LAA emptying velocity was 60 cm/s.   4. The mitral valve is normal in structure. Trivial mitral valve  regurgitation. No evidence of mitral stenosis.   5. The aortic valve is tricuspid. Aortic valve regurgitation is trivial.  No aortic stenosis is present.   6. The inferior vena cava is normal in size with greater than 50%  respiratory variability, suggesting right atrial pressure of 3 mmHg.   7. NA and demonstrates No 3D acquistioin.    CHA2DS2-VASc Score = 1  The patient's score is based upon: CHF History: 0 HTN History: 0 Diabetes History: 0 Stroke History: 0 Vascular Disease History: 0 Age Score: 1 Gender Score: 0       ASSESSMENT AND PLAN: Persistent Atrial Fibrillation/atrial flutter (ICD10:  I48.19) The patient's CHA2DS2-VASc score is 1, indicating a 0.6% annual risk of stroke.   S/p afib and flutter ablation 12/05/24 He is back in atrial flutter with elevated rates. We discussed rhythm control options. Will plan for DCCV.  Check bmet/cbc/mag. Continue dofetilide  500 mcg BID Continue Xarelto  20 mg daily Continue diltiazem  30 mg PRN q 4 hours for heart racing.   High Risk Medication Monitoring (ICD 10: [id number]) Patient requires ongoing monitoring for anti-arrhythmic medication which has the potential to cause life threatening arrhythmias. QT interval on ECG acceptable for dofetilide  monitoring. Check bmet/mag today.     OSA  Encouraged nightly CPAP   Follow up in the AF clinic post DCCV.    Informed Consent   Shared Decision Making/Informed Consent The risks (stroke, cardiac arrhythmias rarely  resulting in the need for a temporary or permanent pacemaker, skin irritation or burns and complications associated with conscious sedation including aspiration, arrhythmia, respiratory failure and death), benefits (restoration of normal sinus rhythm) and alternatives of a direct current cardioversion were explained in detail to Mr. Burrill and he agrees to proceed.       Daril Kicks PA-C Afib Clinic [facility] [address] [place], [place] [address] [phone]  "

## 2025-01-11 ENCOUNTER — Encounter (HOSPITAL_COMMUNITY): Admission: RE | Disposition: A | Payer: Self-pay

## 2025-01-11 ENCOUNTER — Ambulatory Visit (HOSPITAL_COMMUNITY): Admitting: Anesthesiology

## 2025-01-11 ENCOUNTER — Other Ambulatory Visit: Payer: Self-pay

## 2025-01-11 ENCOUNTER — Ambulatory Visit (HOSPITAL_COMMUNITY): Admission: RE | Admit: 2025-01-11 | Discharge: 2025-01-11 | Disposition: A | Discharge status: Home / Self Care

## 2025-01-11 ENCOUNTER — Encounter (HOSPITAL_COMMUNITY): Payer: Self-pay

## 2025-01-11 DIAGNOSIS — Z79899 Other long term (current) drug therapy: Secondary | ICD-10-CM | POA: Insufficient documentation

## 2025-01-11 DIAGNOSIS — G4733 Obstructive sleep apnea (adult) (pediatric): Secondary | ICD-10-CM | POA: Insufficient documentation

## 2025-01-11 DIAGNOSIS — I2510 Atherosclerotic heart disease of native coronary artery without angina pectoris: Secondary | ICD-10-CM | POA: Diagnosis not present

## 2025-01-11 DIAGNOSIS — Z7901 Long term (current) use of anticoagulants: Secondary | ICD-10-CM | POA: Diagnosis not present

## 2025-01-11 DIAGNOSIS — I4892 Unspecified atrial flutter: Secondary | ICD-10-CM | POA: Insufficient documentation

## 2025-01-11 DIAGNOSIS — I4891 Unspecified atrial fibrillation: Secondary | ICD-10-CM | POA: Diagnosis not present

## 2025-01-11 DIAGNOSIS — I4819 Other persistent atrial fibrillation: Secondary | ICD-10-CM | POA: Diagnosis present

## 2025-01-11 HISTORY — PX: CARDIOVERSION: EP1203

## 2025-01-11 MED ORDER — PROPOFOL 10 MG/ML IV BOLUS
INTRAVENOUS | Status: DC | PRN
  Administered 2025-01-11: 70 mg via INTRAVENOUS

## 2025-01-11 MED ORDER — SODIUM CHLORIDE 0.9 % IV SOLN: INTRAVENOUS | Status: DC

## 2025-01-11 MED ORDER — LIDOCAINE 2% (20 MG/ML) 5 ML SYRINGE
INTRAMUSCULAR | Status: DC | PRN
  Administered 2025-01-11: 50 mg via INTRAVENOUS

## 2025-01-11 NOTE — Anesthesia Postprocedure Evaluation (Signed)
"   Anesthesia Post Note  Patient: Johnathan [name]  Procedure(s) Performed: CARDIOVERSION     Patient location during evaluation: Cath Lab Anesthesia Type: General Level of consciousness: awake and alert Pain management: pain level controlled Vital Signs Assessment: post-procedure vital signs reviewed and stable Respiratory status: spontaneous breathing, nonlabored ventilation and respiratory function stable Cardiovascular status: blood pressure returned to baseline and stable Postop Assessment: no apparent nausea or vomiting Anesthetic complications: no   There were no known notable events for this encounter.  Last Vitals:  Vitals:   01/11/25 1330 01/11/25 1340  BP: 114/81 (!) 121/96  Pulse: 62 (!) 57  Resp: 11 (!) 7  Temp:    SpO2: 96% 98%    Last Pain:  Vitals:   01/11/25 1340  TempSrc:   PainSc: 0-No pain                 Kafi Dotter      "

## 2025-01-11 NOTE — Transfer of Care (Signed)
 Immediate Anesthesia Transfer of Care Note  Patient: Johnathan [name]  Procedure(s) Performed: CARDIOVERSION  Patient Location: Cath Lab  Anesthesia Type:General  Level of Consciousness: awake  Airway & Oxygen Therapy: Patient Spontanous Breathing and Patient connected to nasal cannula oxygen  Post-op Assessment: Report given to RN and Post -op Vital signs reviewed and stable  Post vital signs: Reviewed and stable  Last Vitals:  Vitals Value Taken Time  BP 109/72 01/11/25 13:10  Temp    Pulse 67 01/11/25 13:10  Resp 15 01/11/25 13:10  SpO2 94 % 01/11/25 13:10    Last Pain:  Vitals:   01/11/25 1310  TempSrc:   PainSc: Asleep         Complications: There were no known notable events for this encounter.

## 2025-01-11 NOTE — Anesthesia Preprocedure Evaluation (Signed)
 "                                  Anesthesia Evaluation  Patient identified by MRN, date of birth, ID band Patient awake    Reviewed: Allergy & Precautions, NPO status , Patient's Chart, lab work & pertinent test results  History of Anesthesia Complications Negative for: history of anesthetic complications  Airway Mallampati: I  TM Distance: >3 FB Neck ROM: Full    Dental  (+) Teeth Intact, Dental Advisory Given   Pulmonary sleep apnea  Pt just got inspire so doesn't  use CPAP   breath sounds clear to auscultation       Cardiovascular (-) angina + CAD  (-) Past MI + dysrhythmias (on xarelto ) Atrial Fibrillation  Rhythm:Irregular  03/03/2024 TEE 1. Left ventricular ejection fraction, by estimation, is 50 to 55%. The  left ventricle has low normal function.   2. Right ventricular systolic function is normal. The right ventricular  size is mildly enlarged.   3. No atrial kick on mitral TDI. No left atrial/left atrial appendage  thrombus was detected. The LAA emptying velocity was 60 cm/s.   4. The mitral valve is normal in structure. Trivial mitral valve  regurgitation. No evidence of mitral stenosis.   5. The aortic valve is tricuspid. Aortic valve regurgitation is trivial.  No aortic stenosis is present.   6. The inferior vena cava is normal in size with greater than 50%  respiratory variability, suggesting right atrial pressure of 3 mmHg.   7. NA and demonstrates No 3D acquistioin.      Neuro/Psych   Anxiety     negative neurological ROS     GI/Hepatic ,neg GERD  ,,  Endo/Other  neg diabetes    Renal/GU Lab Results      Component                Value               Date                       K                        4.7                 11/10/2024                CO2                      27                  11/10/2024                BUN                      17                  11/10/2024                CREATININE               0.98                 11/10/2024                    Musculoskeletal  (+)  Arthritis ,    Abdominal   Peds  Hematology Lab Results      Component                Value               Date                      WBC                      7.6                 11/10/2024                HGB                      15.5                11/10/2024                HCT                      47.5                11/10/2024                MCV                      94                  11/10/2024                PLT                      268                 11/10/2024              Anesthesia Other Findings Joo:[id number]  Reproductive/Obstetrics                              Anesthesia Physical Anesthesia Plan  ASA: 2  Anesthesia Plan: General   Post-op Pain Management: Minimal or no pain anticipated   Induction: Intravenous  PONV Risk Score and Plan: Treatment may vary due to age or medical condition  Airway Management Planned: Nasal Cannula, Simple Face Mask and Natural Airway  Additional Equipment: None  Intra-op Plan:   Post-operative Plan:   Informed Consent: I have reviewed the patients History and Physical, chart, labs and discussed the procedure including the risks, benefits and alternatives for the proposed anesthesia with the patient or authorized representative who has indicated his/her understanding and acceptance.     Dental advisory given  Plan Discussed with: CRNA  Anesthesia Plan Comments:          Anesthesia Quick Evaluation  "

## 2025-01-11 NOTE — Interval H&P Note (Signed)
 History and Physical Interval Note:  01/11/2025 12:13 PM  Johnathan [name]  has presented today for surgery, with the diagnosis of afib.  The various methods of treatment have been discussed with the patient and family. After consideration of risks, benefits and other options for treatment, the patient has consented to  Procedures: CARDIOVERSION (N/A) as a surgical intervention.  The patient's history has been reviewed, patient examined, no change in status, stable for surgery.  I have reviewed the patient's chart and labs.  Questions were answered to the patient's satisfaction.     Chitara Clonch H Azobou Tonleu

## 2025-01-12 ENCOUNTER — Ambulatory Visit (HOSPITAL_COMMUNITY): Payer: Self-pay | Admitting: Internal Medicine

## 2025-01-25 ENCOUNTER — Other Ambulatory Visit (HOSPITAL_COMMUNITY): Payer: Self-pay | Admitting: Orthopedic Surgery

## 2025-01-25 DIAGNOSIS — M79642 Pain in left hand: Secondary | ICD-10-CM

## 2025-01-30 ENCOUNTER — Ambulatory Visit (HOSPITAL_COMMUNITY): Admitting: Internal Medicine

## 2025-01-30 NOTE — Progress Notes (Incomplete)
 "   Primary Care Physician: Rollene Almarie LABOR, MD Primary Cardiologist: Dorn Lesches, MD Electrophysiologist: Will Gladis Norton, MD  Referring Physician: Delon Holts, PA-C     Johnathan [name] is a 69 y.o. male with a history of OSA on CPAP, HLD, and paroxysmal atrial fibrillation who presents for follow up in the [facility]. Seen by Cardiology on 03/02/24 and noted to be in atrial flutter. S/p successful DCCV on 03/03/24. Seen by Dr. Lesches yesterday 4/21 and noted to be in sinus tachycardia. Patient is on Xarelto  for stroke prevention.  S/p Afib/flutter ablation on 12/05/24 by Dr. Norton.   Follow-up 01/30/2025.  S/p DCCV on 01/11/2025.  Patient has not missed any doses of Tikosyn  or Xarelto .  Today, he  denies symptoms of chest pain, shortness of breath, orthopnea, PND, lower extremity edema, dizziness, presyncope, syncope, bleeding, or neurologic sequela. The patient is tolerating medications without difficulties and is otherwise without complaint today.    Atrial Fibrillation Risk Factors:  he does have symptoms or diagnosis of sleep apnea. he is compliant with CPAP therapy.   he has a BMI of There is no height or weight on file to calculate BMI.. There were no vitals filed for this visit.   Current Outpatient Medications  Medication Sig Dispense Refill   allopurinol  (ZYLOPRIM ) 300 MG tablet TAKE 1 TABLET BY MOUTH EVERY DAY (Patient taking differently: Take 150 mg by mouth 2 (two) times daily.) 90 tablet 3   atorvastatin  (LIPITOR) 80 MG tablet Take 1 tablet (80 mg total) by mouth daily. 90 tablet 3   colchicine  0.6 MG tablet TAKE 1 TABLET WITH ONSET OF GOUT, REPEAT 1 TABLET IN 3 HOURS.THEN 1 TABLET DAILY UNTIL RESOLVED. 30 tablet 11   diltiazem  (CARDIZEM ) 30 MG tablet Take 1 Tablet Every 4 Hours As Needed For HR >100 and Top BP >100 45 tablet 1   dofetilide  (TIKOSYN ) 500 MCG capsule TAKE 1 CAPSULE BY MOUTH 2 TIMES DAILY. 180 capsule 2    escitalopram  (LEXAPRO ) 5 MG tablet TAKE 1 TABLET (5 MG TOTAL) BY MOUTH DAILY. 90 tablet 1   eszopiclone  (LUNESTA ) 2 MG TABS tablet TAKE 1 TABLET BY MOUTH AT BEDTIME AS NEEDED FOR SLEEP 30 tablet 2   ezetimibe  (ZETIA ) 10 MG tablet Take 1 tablet (10 mg total) by mouth daily. 90 tablet 3   rivaroxaban  (XARELTO ) 20 MG TABS tablet Take 1 tablet (20 mg total) by mouth daily with supper. 30 tablet 11   No current facility-administered medications for this visit.    Atrial Fibrillation Management history:  Previous antiarrhythmic drugs: Tikosyn  Previous cardioversions: multiple remotely, 03/03/24, 01/11/2025 Previous ablations: ~2016 [facility], 12/05/24 Anticoagulation history: Xarelto    ROS- All systems are reviewed and negative except as per the HPI above.  Physical Exam: There were no vitals taken for this visit.  GEN- The patient is well appearing, alert and oriented x 3 today.   Neck - no JVD or carotid bruit noted Lungs- Clear to ausculation bilaterally, normal work of breathing Heart- ***Regular rate and rhythm, no murmurs, rubs or gallops, PMI not laterally displaced Extremities- no clubbing, cyanosis, or edema Skin - no rash or ecchymosis noted    ECG: ***   Echo 03/03/24 (TEE) demonstrated  1. Left ventricular ejection fraction, by estimation, is 50 to 55%. The  left ventricle has low normal function.   2. Right ventricular systolic function is normal. The right ventricular  size is mildly enlarged.   3. No atrial kick on  mitral TDI. No left atrial/left atrial appendage  thrombus was detected. The LAA emptying velocity was 60 cm/s.   4. The mitral valve is normal in structure. Trivial mitral valve  regurgitation. No evidence of mitral stenosis.   5. The aortic valve is tricuspid. Aortic valve regurgitation is trivial.  No aortic stenosis is present.   6. The inferior vena cava is normal in size with greater than 50%  respiratory variability, suggesting right atrial pressure of 3  mmHg.   7. NA and demonstrates No 3D acquistioin.    CHA2DS2-VASc Score = 1  The patient's score is based upon: CHF History: 0 HTN History: 0 Diabetes History: 0 Stroke History: 0 Vascular Disease History: 0 Age Score: 1 Gender Score: 0       ASSESSMENT AND PLAN: Persistent Atrial Fibrillation/atrial flutter (ICD10:  I48.19) The patient's CHA2DS2-VASc score is 1, indicating a 0.6% annual risk of stroke.   S/p afib and flutter ablation 12/05/24. S/p DCCV on 01/11/2025.  Patient is currently in***.  We will continue with current management and not make any medication changes at this time.  Continue Xarelto  20 mg daily without interruption in the blanking period and post cardioversion.  High risk medication monitoring (ICD10: [id number]) Patient requires ongoing monitoring for anti-arrhythmic medication which has the potential to cause life threatening arrhythmias or AV block. Qtc stable. Continue Tikosyn  500 mcg twice daily. BMET and mag from 01/10/2025 are stable.  OSA  Encouraged nightly CPAP    Follow up***.    Dorn Heinrich, PA-C [facility] [address] [place], [place] [address] [phone]  "

## 2025-02-08 ENCOUNTER — Ambulatory Visit (HOSPITAL_COMMUNITY): Admitting: Internal Medicine

## 2025-03-06 ENCOUNTER — Ambulatory Visit (HOSPITAL_COMMUNITY): Admitting: Internal Medicine
